# Patient Record
Sex: Female | Born: 1935 | Hispanic: No | Smoking: Never smoker
Health system: Southern US, Community
[De-identification: ages and names within clinical notes are randomized; demographics above are authoritative.]

## PROBLEM LIST (undated history)

## (undated) DIAGNOSIS — K219 Gastro-esophageal reflux disease without esophagitis: Secondary | ICD-10-CM

## (undated) DIAGNOSIS — E041 Nontoxic single thyroid nodule: Secondary | ICD-10-CM

## (undated) DIAGNOSIS — I639 Cerebral infarction, unspecified: Secondary | ICD-10-CM

## (undated) DIAGNOSIS — I10 Essential (primary) hypertension: Secondary | ICD-10-CM

## (undated) DIAGNOSIS — I5032 Chronic diastolic (congestive) heart failure: Secondary | ICD-10-CM

## (undated) DIAGNOSIS — N183 Chronic kidney disease, stage 3 (moderate): Secondary | ICD-10-CM

## (undated) DIAGNOSIS — F0390 Unspecified dementia without behavioral disturbance: Secondary | ICD-10-CM

## (undated) HISTORY — PX: OTHER SURGICAL HISTORY: SHX169

---

## 1997-12-22 ENCOUNTER — Other Ambulatory Visit: Admission: RE | Admit: 1997-12-22 | Discharge: 1997-12-22 | Payer: Self-pay | Admitting: Internal Medicine

## 1998-02-12 ENCOUNTER — Other Ambulatory Visit: Admission: RE | Admit: 1998-02-12 | Discharge: 1998-02-12 | Payer: Self-pay | Admitting: Family Medicine

## 1998-02-28 ENCOUNTER — Ambulatory Visit (HOSPITAL_COMMUNITY): Admission: RE | Admit: 1998-02-28 | Discharge: 1998-02-28 | Payer: Self-pay | Admitting: Family Medicine

## 1998-03-29 ENCOUNTER — Ambulatory Visit (HOSPITAL_COMMUNITY): Admission: RE | Admit: 1998-03-29 | Discharge: 1998-03-29 | Payer: Self-pay | Admitting: Internal Medicine

## 2000-04-20 ENCOUNTER — Emergency Department (HOSPITAL_COMMUNITY): Admission: EM | Admit: 2000-04-20 | Discharge: 2000-04-20 | Payer: Self-pay | Admitting: Emergency Medicine

## 2003-03-29 ENCOUNTER — Encounter: Payer: Self-pay | Admitting: General Practice

## 2003-03-29 ENCOUNTER — Encounter: Admission: RE | Admit: 2003-03-29 | Discharge: 2003-03-29 | Payer: Self-pay | Admitting: General Practice

## 2004-07-16 ENCOUNTER — Ambulatory Visit: Payer: Self-pay | Admitting: Family Medicine

## 2004-08-07 ENCOUNTER — Ambulatory Visit: Payer: Self-pay | Admitting: Family Medicine

## 2004-08-16 ENCOUNTER — Ambulatory Visit: Payer: Self-pay | Admitting: Family Medicine

## 2005-11-02 ENCOUNTER — Inpatient Hospital Stay (HOSPITAL_COMMUNITY): Admission: EM | Admit: 2005-11-02 | Discharge: 2005-11-05 | Payer: Self-pay | Admitting: Emergency Medicine

## 2005-11-21 ENCOUNTER — Encounter: Admission: RE | Admit: 2005-11-21 | Discharge: 2005-11-21 | Payer: Self-pay | Admitting: Internal Medicine

## 2006-04-09 ENCOUNTER — Ambulatory Visit: Payer: Self-pay | Admitting: Obstetrics and Gynecology

## 2006-04-14 ENCOUNTER — Ambulatory Visit (HOSPITAL_COMMUNITY): Admission: RE | Admit: 2006-04-14 | Discharge: 2006-04-14 | Payer: Self-pay | Admitting: Obstetrics and Gynecology

## 2006-04-29 ENCOUNTER — Ambulatory Visit (HOSPITAL_COMMUNITY): Admission: RE | Admit: 2006-04-29 | Discharge: 2006-04-29 | Payer: Self-pay | Admitting: Obstetrics and Gynecology

## 2008-06-26 ENCOUNTER — Inpatient Hospital Stay (HOSPITAL_COMMUNITY): Admission: AD | Admit: 2008-06-26 | Discharge: 2008-06-29 | Payer: Self-pay | Admitting: Internal Medicine

## 2011-01-14 NOTE — H&P (Signed)
NAMESABELLA, TRAORE                 ACCOUNT NO.:  [id number]   MEDICAL RECORD NO.:  [id number]          PATIENT TYPE:  INP   LOCATION:  4715                         FACILITY:  [facility]   PHYSICIAN:  Fleet Contras, M.D.    DATE OF BIRTH:  [birth year]   DATE OF ADMISSION:  06/26/2008  DATE OF DISCHARGE:                              HISTORY & PHYSICAL   PRESENTING COMPLAINTS:  Weakness and anorexia.   HISTORY OF PRESENTING ILLNESS:  Ms. Shor is an 77 year old African  American lady with multiple medical problems including systemic  hypertension, pelvic mass, gout, arthritis of the hip, allergic  rhinitis, and dementia.  She was admitted directly from the office,  although she presented with a 1-week history of progressively worsening  fatigue, tiredness, lack of energy, lack of appetite, and generally not  feeling well.  She had some flu-like symptoms with cold, cough, and sore  throat and this was treated symptomatically with over-the-counter  medication with some relief, but today she is still unable to tolerate a  full diet.  She is able to drink some sips of water and she feels her  mouth is dry.  She feels weak.  She denies any chest pain, shortness of  breath, orthopnea, or PND.  She has had no vomiting, no diarrhea.  No  headaches, dizziness, syncope, seizures, or weakness of extremities.  No  dysuria, frequency, or hematuria.  She has had no blood in her stools.  She denies any joint pains or joint swelling.  On examination in the  office, she was noted to be chronically ill-looking.  Her blood pressure  was 102/64, heart rate of 72 and regular, respiratory rate of 18, and  temperature was 98.3.  Her CBG was over 600 and she had glycosuria  without ketonuria.  She was therefore admitted to the hospital because  of her age and new-onset diabetes mellitus.   PAST MEDICAL HISTORY:  1. Systemic hypertension.  2. Dementia.  3. Allergic rhinitis.  4. Arthritis of the hips.  5. Gouty  arthritis.  6. Pelvic mass.  7. Secondary syphilis.  8. Renal insufficiency.   SOCIAL HISTORY:  She currently lives with her daughter and  grandchildren.  She is widowed.  She denies any use of alcohol or  illicit drugs or tobacco.  She does not know to speak much [place].   ALLERGIES:  She has no known drug allergies.   CURRENT MEDICATIONS:  1. Prilosec 20 mg a day.  2. Benicar HCT 40/12.5 once a day.  3. Allopurinol 100 mg daily.  4. Darvocet-N 100 one p.o. q.12 h. p.r.n.  5. Allegra 30 mg daily.  6. Aspirin 325 mg daily.  7. Toprol-XL 100 mg p.o. daily.   REVIEW OF SYSTEMS:  Essentially as above.   PHYSICAL EXAMINATION:  GENERAL:  She is chronically ill-looking, lying  on the examination bed, not in acute respiratory or painful distress,  but looks lethargic.  HEENT:  She is not pale.  She is not icteric.  She is not cyanosed.  She  is dehydrated with dry oral and tongue  mucosa.  Pupils are equal and  reactive to light and accommodation.  VITAL SIGNS:  Her blood pressure is 102/64, heart rate of 72,  respiratory rate of 18, temperature of 98.3, weight 133 pounds, and  height 5 feet 6 inches.  NECK:  Supple.  No elevated JVD.  No cervical lymphadenopathy.  No  carotid bruit.  CHEST:  Good air entry bilaterally with no rales or rhonchi or wheezes.  HEART:  Sounds 1 and 2 are heard with no murmurs.  ABDOMEN:  Full, soft, nontender.  No masses.  Bowel sounds are present.  EXTREMITIES:  No edema, calf tenderness, or swelling.  CNS:  She is alert and oriented x3 with no focal neurological deficits.   LABORATORY DATA:  These are currently still pending.   ASSESSMENT:  Ms. Reid is an 77 year old African lady with multiple  medical problems as above presenting with fatigue and anorexia for about  a week.  She has not been found to have developed new-onset of type 2  diabetes mellitus with marked hyperglycemia and she has been admitted to  the hospital for further treatment.    ADMISSION DIAGNOSES:  1. Severe hyperglycemia.  2. Type 2 diabetes mellitus, new onset.  3. Dehydration.   PLAN OF CARE:  She will be placed on the medical bed.  She will be on  CBGs a.c. and at bedtime, 2 g sodium diet, strict I's and O's.  Vital  signs q.4 h.  Lantus insulin 10 units nightly.  NovoLog insulin per  sliding scale moderate protocol.  Blood pressure medication will be put  on hold until stable.  She will be on IV fluids with normal saline at  500 mL bolus followed by normal saline with 20 of potassium at 100 mL an  hour.  This plan of care has been discussed with her and her grandson  who is with her today and their questions were answered.      Fleet Contras, M.D.  Electronically Signed     EA/MEDQ  D:  06/26/2008  T:  06/27/2008  Job:  [id number]

## 2011-01-14 NOTE — Discharge Summary (Signed)
Cynthia Todd, Cynthia Todd                 ACCOUNT NO.:  [id number]   MEDICAL RECORD NO.:  [id number]          PATIENT TYPE:  INP   LOCATION:  4715                         FACILITY:  [facility]   PHYSICIAN:  Fleet Contras, M.D.    DATE OF BIRTH:  [birth year]   DATE OF ADMISSION:  06/26/2008  DATE OF DISCHARGE:  06/29/2008                               DISCHARGE SUMMARY   HISTORY OF PRESENT ILLNESS:  Ms. Auth is an 77 year old African lady  with past medical history significant for systemic hypertension, gouty  arthritis, allergic rhinitis, and dementia.  She was admitted directly  from my office, but she presented with 1-week history of progressively  worsening weakness, tiredness, lack of energy, lack of appetite,  generally feeling unwell.  She had been treated for recent cold and  cough and sore throat, but her symptoms did not abate.  She was unable  to tolerate full diet, but had been drinking sips of water and the mouth  feels dry.  Her vital signs were stable in the office.  She looks  dehydrated.  Initial CBG in the office was over 600 and she had glucose  in her urine without ketones.  She was therefore admitted to the  hospital as a case of new-onset type 2 diabetes mellitus and  dehydration.   HOSPITAL COURSE:  On admission, the patient was started on IV fluids  with normal saline bolus 100 mL followed by 125 mL an hour.  She was on  Glucommander protocol until her sugars were controlled and then  transitioned to Lantus insulin as well as NovoLog sliding scale insulin.  She also started on oral Amaryl 2 mg once a day.  This was later  increased to 4 mg once a day.  Her symptoms improved and she was able to  tolerate full diet.  Her initial laboratory data did show elevated BUN  and creatinine of 63 and 2.68 respectively.  This improved with  hydration.  Today, her BUN is 23, creatinine is 1.15, glucose is 159.  She was seen by the diabetic coordinator for the hospital and educated  extensively along with her family members on diabetes treatment plan,  dietary nutrition changes necessary, and given information in [place]  language.  Today, she is feeling much better.  Her vital signs are  stable.  She is well hydrated.  She is considered stable for discharge  home.   CONDITION ON DISCHARGE:  Stable.   Her disposition is for home.   DISCHARGE DIAGNOSES:  1. New-onset type 2 diabetes mellitus.  2. Severe hyperglycemia, now resolved.  3. Hyperosmolar state, now resolved.  4. Acute renal failure, now resolved.   Her medication on discharge will be Amaryl 4 mg once a day, aspirin 325  mg p.o. daily, Lantus insulin 10 units at bedtime, allopurinol 100 mg  daily, Claritin 10 mg daily, Darvocet-N 100 one p.o. q.12 p.r.n.  Her  blood pressure medication, Benicar HCT 40/12.5 which had been put on  hold will be restarted at home.   This discharge plan has been discussed with her and her  family and their  questions are answered.      Fleet Contras, M.D.  Electronically Signed     EA/MEDQ  D:  06/29/2008  T:  06/29/2008  Job:  [id number]

## 2011-01-17 NOTE — Group Therapy Note (Signed)
NAMEBURNETT, LIEBER NO.:  [id number]   MEDICAL RECORD NO.:  [id number]          PATIENT TYPE:  WOC   LOCATION:  [facility]                   FACILITY:  [facility]   PHYSICIAN:  Argentina Donovan, MD        DATE OF BIRTH:  Jul 01, 1919   DATE OF SERVICE:                                    CLINIC NOTE   The patient is an 77 year old [place] female, gravida 4, para 2, 0, 2, 2 who  we had difficulty eliciting a complete history from because of the language  barrier and the fact that we had only her 64 year old great-granddaughter  for translation. The patient went in to the emergency room at [facility],  some time in March because of vaginal bleeding and abdominal pain. At that  time they did a transvaginal ultrasound and found a cystic complex mass in  the right midline of the pelvis, probably of ovarian versus likely chronic  hydrosalpinx or hemorrhage and debris. She also had a thickened endometrium  at that time and she was referred over here to the GYN clinic.  She was  admitted at that time and there was difficulty knowing exactly what the  reason was, but her discharge diagnosis was pelvic mass, gout, renal  insufficiency, resolved. Nausea and diarrhea, leukocytosis, hypertension and  secondary syphilis.   The only medication she is on now is Toprol XL 100 daily for her blood  pressure.   The patient is a very pleasant lady and did agree to an endometrial biopsy  which we tried to do but the cervix is so tightly stenosed that it was not  possible, although we could get in and see the cervix with the speculum in  spite of the infibulation. While she was in the hospital she was treated for  secondary syphilis.  Since I cannot do an endometrial biopsy here in the  office, my plan is to get a pelvic CT to see whether that clarifies anything  and then to discuss with GYN oncologist whether or not we should go ahead  and do a D&C, or try to do a D&C prior to sending her over  there for  consultation.   DISCHARGE DIAGNOSIS:  77 year old female with pelvic mass and thickened  endometrial cavity.   A CA-125 is also being done today.           ______________________________  Argentina Donovan, MD     PR/MEDQ  D:  04/09/2006  T:  04/09/2006  Job:  [id number]

## 2011-06-03 LAB — BASIC METABOLIC PANEL
BUN: 47 — ABNORMAL HIGH
CO2: 23
Calcium: 8 — ABNORMAL LOW
Calcium: 8.1 — ABNORMAL LOW
Calcium: 8.4
Chloride: 106
Creatinine, Ser: 1.15
Creatinine, Ser: 1.64 — ABNORMAL HIGH
GFR calc Af Amer: 24 — ABNORMAL LOW
GFR calc non Af Amer: 20 — ABNORMAL LOW
GFR calc non Af Amer: 30 — ABNORMAL LOW
Glucose, Bld: 159 — ABNORMAL HIGH
Glucose, Bld: 185 — ABNORMAL HIGH
Glucose, Bld: 189 — ABNORMAL HIGH
Sodium: 135
Sodium: 138

## 2011-06-03 LAB — GLUCOSE, CAPILLARY
Glucose-Capillary: 117 — ABNORMAL HIGH
Glucose-Capillary: 143 — ABNORMAL HIGH
Glucose-Capillary: 145 — ABNORMAL HIGH
Glucose-Capillary: 170 — ABNORMAL HIGH
Glucose-Capillary: 182 — ABNORMAL HIGH
Glucose-Capillary: 240 — ABNORMAL HIGH
Glucose-Capillary: >600
Glucose-Capillary: >600
Glucose-Capillary: >600

## 2011-06-03 LAB — COMPREHENSIVE METABOLIC PANEL
ALT: 25
Albumin: 3.4 — ABNORMAL LOW
Alkaline Phosphatase: 99
Calcium: 9.1
Chloride: 92 — ABNORMAL LOW
GFR calc non Af Amer: 17 — ABNORMAL LOW
Glucose, Bld: 798
Potassium: 3.9
Total Bilirubin: 0.6

## 2011-06-03 LAB — CBC
HCT: 40.7
Hemoglobin: 12.2
Hemoglobin: 13.6
Platelets: 120 — ABNORMAL LOW
Platelets: 174
RBC: 4.3
RDW: 12.6
RDW: 13.1

## 2011-06-03 LAB — URIC ACID: Uric Acid, Serum: 9 — ABNORMAL HIGH

## 2011-06-03 LAB — HEMOGLOBIN A1C

## 2011-09-02 DIAGNOSIS — I5032 Chronic diastolic (congestive) heart failure: Secondary | ICD-10-CM

## 2011-09-02 HISTORY — DX: Chronic diastolic (congestive) heart failure: I50.32

## 2011-09-27 ENCOUNTER — Encounter (HOSPITAL_COMMUNITY): Payer: Self-pay | Admitting: *Deleted

## 2011-09-27 ENCOUNTER — Other Ambulatory Visit: Payer: Self-pay

## 2011-09-27 ENCOUNTER — Inpatient Hospital Stay (HOSPITAL_COMMUNITY)
Admission: EM | Admit: 2011-09-27 | Discharge: 2011-10-02 | DRG: 152 | Disposition: A | Discharge status: Home / Self Care | Payer: Medicare Other | Attending: Internal Medicine | Admitting: Internal Medicine

## 2011-09-27 ENCOUNTER — Emergency Department (HOSPITAL_COMMUNITY): Payer: Medicare Other

## 2011-09-27 DIAGNOSIS — I5033 Acute on chronic diastolic (congestive) heart failure: Secondary | ICD-10-CM | POA: Diagnosis present

## 2011-09-27 DIAGNOSIS — N289 Disorder of kidney and ureter, unspecified: Secondary | ICD-10-CM | POA: Diagnosis present

## 2011-09-27 DIAGNOSIS — E119 Type 2 diabetes mellitus without complications: Secondary | ICD-10-CM | POA: Diagnosis present

## 2011-09-27 DIAGNOSIS — Z794 Long term (current) use of insulin: Secondary | ICD-10-CM

## 2011-09-27 DIAGNOSIS — R062 Wheezing: Secondary | ICD-10-CM

## 2011-09-27 DIAGNOSIS — I10 Essential (primary) hypertension: Secondary | ICD-10-CM

## 2011-09-27 DIAGNOSIS — R0789 Other chest pain: Secondary | ICD-10-CM | POA: Diagnosis present

## 2011-09-27 DIAGNOSIS — I509 Heart failure, unspecified: Secondary | ICD-10-CM | POA: Diagnosis present

## 2011-09-27 DIAGNOSIS — IMO0002 Reserved for concepts with insufficient information to code with codable children: Secondary | ICD-10-CM | POA: Diagnosis present

## 2011-09-27 DIAGNOSIS — R0602 Shortness of breath: Secondary | ICD-10-CM

## 2011-09-27 DIAGNOSIS — F0390 Unspecified dementia without behavioral disturbance: Secondary | ICD-10-CM | POA: Diagnosis present

## 2011-09-27 DIAGNOSIS — I129 Hypertensive chronic kidney disease with stage 1 through stage 4 chronic kidney disease, or unspecified chronic kidney disease: Secondary | ICD-10-CM | POA: Diagnosis present

## 2011-09-27 DIAGNOSIS — J111 Influenza due to unidentified influenza virus with other respiratory manifestations: Principal | ICD-10-CM | POA: Diagnosis present

## 2011-09-27 DIAGNOSIS — I5030 Unspecified diastolic (congestive) heart failure: Secondary | ICD-10-CM

## 2011-09-27 DIAGNOSIS — I272 Pulmonary hypertension, unspecified: Secondary | ICD-10-CM | POA: Diagnosis present

## 2011-09-27 DIAGNOSIS — D72829 Elevated white blood cell count, unspecified: Secondary | ICD-10-CM | POA: Diagnosis present

## 2011-09-27 DIAGNOSIS — E876 Hypokalemia: Secondary | ICD-10-CM | POA: Diagnosis present

## 2011-09-27 DIAGNOSIS — I2789 Other specified pulmonary heart diseases: Secondary | ICD-10-CM | POA: Diagnosis present

## 2011-09-27 DIAGNOSIS — E118 Type 2 diabetes mellitus with unspecified complications: Secondary | ICD-10-CM | POA: Diagnosis present

## 2011-09-27 DIAGNOSIS — R071 Chest pain on breathing: Secondary | ICD-10-CM | POA: Diagnosis present

## 2011-09-27 DIAGNOSIS — N189 Chronic kidney disease, unspecified: Secondary | ICD-10-CM | POA: Diagnosis present

## 2011-09-27 HISTORY — DX: Essential (primary) hypertension: I10

## 2011-09-27 LAB — BASIC METABOLIC PANEL
BUN: 24 mg/dL — ABNORMAL HIGH (ref 6–23)
CO2: 27 mEq/L (ref 19–32)
Calcium: 9.3 mg/dL (ref 8.4–10.5)
Chloride: 98 mEq/L (ref 96–112)
Creatinine, Ser: 1.16 mg/dL — ABNORMAL HIGH (ref 0.50–1.10)
GFR calc Af Amer: 46 mL/min — ABNORMAL LOW (ref >90)
GFR calc non Af Amer: 40 mL/min — ABNORMAL LOW (ref >90)
Glucose, Bld: 109 mg/dL — ABNORMAL HIGH (ref 70–99)
Potassium: 3 mEq/L — ABNORMAL LOW (ref 3.5–5.1)
Sodium: 135 mEq/L (ref 135–145)

## 2011-09-27 LAB — CBC
HCT: 38.3 % (ref 36.0–46.0)
Hemoglobin: 13.5 g/dL (ref 12.0–15.0)
MCH: 32.5 pg (ref 26.0–34.0)
MCHC: 35.2 g/dL (ref 30.0–36.0)
MCV: 92.1 fL (ref 78.0–100.0)
Platelets: 180 10*3/uL (ref 150–400)
RBC: 4.16 MIL/uL (ref 3.87–5.11)
RDW: 13.1 % (ref 11.5–15.5)
WBC: 5.1 10*3/uL (ref 4.0–10.5)

## 2011-09-27 LAB — CARDIAC PANEL(CRET KIN+CKTOT+MB+TROPI)
CK, MB: 4.5 ng/mL — ABNORMAL HIGH (ref 0.3–4.0)
Relative Index: 1.8 (ref 0.0–2.5)
Total CK: 250 U/L — ABNORMAL HIGH (ref 7–177)
Troponin I: <0.3 ng/mL (ref <0.30)

## 2011-09-27 MED ORDER — ALBUTEROL SULFATE (5 MG/ML) 0.5% IN NEBU
INHALATION_SOLUTION | RESPIRATORY_TRACT | Status: AC
  Administered 2011-09-27: 5 mg
  Filled 2011-09-27: qty 1

## 2011-09-27 MED ORDER — ALBUTEROL (5 MG/ML) CONTINUOUS INHALATION SOLN
10.0000 mg/h | INHALATION_SOLUTION | RESPIRATORY_TRACT | Status: AC
  Administered 2011-09-27: 10 mg/h via RESPIRATORY_TRACT
  Filled 2011-09-27: qty 20

## 2011-09-27 MED ORDER — IPRATROPIUM BROMIDE 0.02 % IN SOLN
RESPIRATORY_TRACT | Status: AC
  Administered 2011-09-27: 0.5 mg via RESPIRATORY_TRACT
  Filled 2011-09-27: qty 2.5

## 2011-09-27 NOTE — ED Notes (Signed)
Patient with shortness of breath since Wednesday and was suppose to see MD on Friday but the office was close.  Patient is wheezing at triage.

## 2011-09-27 NOTE — ED Provider Notes (Signed)
History is obtained via a medical interpreter using language line. Planes of cough and shortness of breath and subjective fever onset 5 days ago. Symptoms accompanied by pleuritic chest pain. Were preceded by vomiting and diarrhea. On exam no acute distress alert neck supple no JVD lungs with prolonged expiration phase with expiratory wheezes with occasional cough heart regular rate and rhythm abdomen nondistended nontender extremities no ED  Doug Sou, MD 09/27/11 2255

## 2011-09-27 NOTE — ED Provider Notes (Signed)
History     CSN: [id number]  Arrival date & time 09/27/11  1909   First MD Initiated Contact with Patient 09/27/11 2125      Chief Complaint  Patient presents with  . Shortness of Breath    (Consider location/radiation/quality/duration/timing/severity/associated sxs/prior treatment) HPI  Past Medical History  Diagnosis Date  . Diabetes mellitus   . Hypertension     History reviewed. No pertinent past surgical history.  History reviewed. No pertinent family history.  History  Substance Use Topics  . Smoking status: Never Smoker   . Smokeless tobacco: Not on file  . Alcohol Use: No    OB History    Grav Para Term Preterm Abortions TAB SAB Ect Mult Living                  Review of Systems  Allergies  Review of patient's allergies indicates no known allergies.  Home Medications   Current Outpatient Rx  Name Route Sig Dispense Refill  . ACETAMINOPHEN 500 MG PO TABS Oral Take 500 mg by mouth at bedtime as needed. For pain/headache    . GLIMEPIRIDE 4 MG PO TABS Oral Take 4 mg by mouth daily before breakfast.    . INSULIN GLARGINE 100 UNIT/ML [place] SOLN Subcutaneous Inject 100 Units into the skin at bedtime.    Marland Kitchen LEVOTHYROXINE SODIUM 50 MCG PO TABS Oral Take 50 mcg by mouth daily.    Marland Kitchen NAPHAZOLINE HCL 0.012 % OP SOLN Both Eyes Place 2 drops into both eyes 4 (four) times daily as needed. For irritation    . NEBIVOLOL HCL 5 MG PO TABS Oral Take 5 mg by mouth daily.      BP 232/89  Pulse 74  Temp(Src) 98.1 F (36.7 C) (Oral)  Resp 22  SpO2 95%  Physical Exam  ED Course  Procedures (including critical care time)  Labs Reviewed - No data to display Dg Chest 2 View  09/27/2011  *RADIOLOGY REPORT*  Clinical Data: 78 year old female with shortness of breath.  CHEST - 2 VIEW  Comparison: CT abdomen 04/14/2006.  Findings: Small bilateral pleural effusions.  Stable mild elevation of the right hemidiaphragm.  Pulmonary vascular congestion.  No consolidation.  No  pneumothorax.  Trace fluid in the right minor fissure.  Calcified atherosclerosis of the aorta. No acute osseous abnormality identified.  IMPRESSION: Small pleural effusions.  Vascular congestion without overt pulmonary edema.  Original Report Authenticated By: Harley Hallmark, M.D.     No diagnosis found.    MDM  DUPLICATE NOTE        Doug Sou, MD 09/29/11 4098

## 2011-09-27 NOTE — ED Notes (Signed)
Pt reports wheezing and sob since Wednesday. Noted RR 32 with adventitious breath sounds post breathin tx. No cough noted. Pt also noted hypertensive. Dtr at bedside.

## 2011-09-27 NOTE — ED Notes (Signed)
Arranging for patient to be taken to POD.

## 2011-09-28 ENCOUNTER — Inpatient Hospital Stay (HOSPITAL_COMMUNITY): Payer: Medicare Other

## 2011-09-28 ENCOUNTER — Encounter (HOSPITAL_COMMUNITY): Payer: Self-pay | Admitting: Internal Medicine

## 2011-09-28 DIAGNOSIS — E119 Type 2 diabetes mellitus without complications: Secondary | ICD-10-CM | POA: Diagnosis present

## 2011-09-28 DIAGNOSIS — I10 Essential (primary) hypertension: Secondary | ICD-10-CM | POA: Diagnosis present

## 2011-09-28 DIAGNOSIS — E876 Hypokalemia: Secondary | ICD-10-CM | POA: Diagnosis present

## 2011-09-28 DIAGNOSIS — R0602 Shortness of breath: Secondary | ICD-10-CM | POA: Diagnosis present

## 2011-09-28 DIAGNOSIS — E041 Nontoxic single thyroid nodule: Secondary | ICD-10-CM

## 2011-09-28 DIAGNOSIS — R071 Chest pain on breathing: Secondary | ICD-10-CM | POA: Diagnosis present

## 2011-09-28 HISTORY — DX: Nontoxic single thyroid nodule: E04.1

## 2011-09-28 LAB — COMPREHENSIVE METABOLIC PANEL
AST: 53 U/L — ABNORMAL HIGH (ref 0–37)
Albumin: 2.7 g/dL — ABNORMAL LOW (ref 3.5–5.2)
BUN: 22 mg/dL (ref 6–23)
Calcium: 8.4 mg/dL (ref 8.4–10.5)
Chloride: 98 mEq/L (ref 96–112)
Creatinine, Ser: 0.99 mg/dL (ref 0.50–1.10)
Total Bilirubin: 0.3 mg/dL (ref 0.3–1.2)

## 2011-09-28 LAB — URINALYSIS, ROUTINE W REFLEX MICROSCOPIC
Bilirubin Urine: NEGATIVE
Glucose, UA: NEGATIVE mg/dL
Ketones, ur: NEGATIVE mg/dL
Leukocytes, UA: NEGATIVE
Nitrite: NEGATIVE
Protein, ur: >300 mg/dL — AB
Specific Gravity, Urine: 1.013 (ref 1.005–1.030)
Urobilinogen, UA: 0.2 mg/dL (ref 0.0–1.0)
pH: 5.5 (ref 5.0–8.0)

## 2011-09-28 LAB — GLUCOSE, CAPILLARY: Glucose-Capillary: 259 mg/dL — ABNORMAL HIGH (ref 70–99)

## 2011-09-28 LAB — CARDIAC PANEL(CRET KIN+CKTOT+MB+TROPI)
Relative Index: 2.4 (ref 0.0–2.5)
Total CK: 193 U/L — ABNORMAL HIGH (ref 7–177)
Total CK: 187 U/L — ABNORMAL HIGH (ref 7–177)
Troponin I: <0.3 ng/mL (ref <0.30)
Troponin I: <0.3 ng/mL (ref <0.30)

## 2011-09-28 LAB — MAGNESIUM: Magnesium: 1.6 mg/dL (ref 1.5–2.5)

## 2011-09-28 LAB — MRSA PCR SCREENING: MRSA by PCR: NEGATIVE

## 2011-09-28 LAB — PHOSPHORUS: Phosphorus: 4.6 mg/dL (ref 2.3–4.6)

## 2011-09-28 LAB — URINE MICROSCOPIC-ADD ON

## 2011-09-28 LAB — TSH: TSH: 1.399 u[IU]/mL (ref 0.350–4.500)

## 2011-09-28 LAB — CBC
Hemoglobin: 12.8 g/dL (ref 12.0–15.0)
MCH: 31.8 pg (ref 26.0–34.0)
MCV: 92.5 fL (ref 78.0–100.0)
Platelets: 188 10*3/uL (ref 150–400)
RBC: 4.02 MIL/uL (ref 3.87–5.11)
WBC: 6.1 10*3/uL (ref 4.0–10.5)

## 2011-09-28 LAB — PRO B NATRIURETIC PEPTIDE: Pro B Natriuretic peptide (BNP): 1438 pg/mL — ABNORMAL HIGH (ref 0–450)

## 2011-09-28 MED ORDER — INSULIN ASPART 100 UNIT/ML ~~LOC~~ SOLN
0.0000 [IU] | Freq: Three times a day (TID) | SUBCUTANEOUS | Status: DC
  Administered 2011-09-28: 20 [IU] via SUBCUTANEOUS
  Administered 2011-09-28: 11 [IU] via SUBCUTANEOUS
  Administered 2011-09-29: 7 [IU] via SUBCUTANEOUS
  Administered 2011-09-29: 20 [IU] via SUBCUTANEOUS
  Administered 2011-09-30: 3 [IU] via SUBCUTANEOUS
  Administered 2011-09-30 – 2011-10-01 (×3): 4 [IU] via SUBCUTANEOUS
  Administered 2011-10-01: 7 [IU] via SUBCUTANEOUS
  Administered 2011-10-01: 3 [IU] via SUBCUTANEOUS
  Administered 2011-10-02: 11 [IU] via SUBCUTANEOUS

## 2011-09-28 MED ORDER — ACETAMINOPHEN 500 MG PO TABS: 500.0000 mg | ORAL_TABLET | Freq: Every evening | ORAL | Status: DC | PRN

## 2011-09-28 MED ORDER — HYDROCODONE-ACETAMINOPHEN 5-325 MG PO TABS: 1.0000 | ORAL_TABLET | ORAL | Status: DC | PRN

## 2011-09-28 MED ORDER — HYDRALAZINE HCL 20 MG/ML IJ SOLN
10.0000 mg | INTRAMUSCULAR | Status: DC | PRN
  Administered 2011-09-28: 10 mg via INTRAVENOUS
  Filled 2011-09-28 (×2): qty 0.5

## 2011-09-28 MED ORDER — GLIMEPIRIDE 4 MG PO TABS
4.0000 mg | ORAL_TABLET | Freq: Every day | ORAL | Status: DC
  Administered 2011-09-28 – 2011-10-02 (×5): 4 mg via ORAL
  Filled 2011-09-28 (×6): qty 1

## 2011-09-28 MED ORDER — OXYCODONE HCL 5 MG PO TABS: 5.0000 mg | ORAL_TABLET | ORAL | Status: DC | PRN

## 2011-09-28 MED ORDER — INSULIN ASPART 100 UNIT/ML ~~LOC~~ SOLN
6.0000 [IU] | Freq: Three times a day (TID) | SUBCUTANEOUS | Status: DC
  Administered 2011-09-28 (×2): 6 [IU] via SUBCUTANEOUS

## 2011-09-28 MED ORDER — ALUM & MAG HYDROXIDE-SIMETH 200-200-20 MG/5ML PO SUSP: 30.0000 mL | Freq: Four times a day (QID) | ORAL | Status: DC | PRN

## 2011-09-28 MED ORDER — IPRATROPIUM BROMIDE 0.02 % IN SOLN
RESPIRATORY_TRACT | Status: AC
  Administered 2011-09-28: 0.5 mg via RESPIRATORY_TRACT
  Filled 2011-09-28: qty 2.5

## 2011-09-28 MED ORDER — HYDRALAZINE HCL 20 MG/ML IJ SOLN
10.0000 mg | Freq: Four times a day (QID) | INTRAMUSCULAR | Status: DC
  Administered 2011-09-28 – 2011-09-29 (×3): 10 mg via INTRAVENOUS
  Filled 2011-09-28 (×8): qty 0.5

## 2011-09-28 MED ORDER — ONDANSETRON HCL 4 MG/2ML IJ SOLN: 4.0000 mg | Freq: Four times a day (QID) | INTRAMUSCULAR | Status: DC | PRN

## 2011-09-28 MED ORDER — BUDESONIDE 0.5 MG/2ML IN SUSP
0.5000 mg | Freq: Once | RESPIRATORY_TRACT | Status: AC
  Administered 2011-09-28: 0.5 mg via RESPIRATORY_TRACT
  Filled 2011-09-28: qty 2

## 2011-09-28 MED ORDER — MOXIFLOXACIN HCL 400 MG PO TABS
400.0000 mg | ORAL_TABLET | Freq: Once | ORAL | Status: AC
  Administered 2011-09-28: 400 mg via ORAL
  Filled 2011-09-28: qty 1

## 2011-09-28 MED ORDER — FUROSEMIDE 10 MG/ML IJ SOLN
60.0000 mg | Freq: Once | INTRAMUSCULAR | Status: AC
  Administered 2011-09-28: 60 mg via INTRAVENOUS
  Filled 2011-09-28: qty 6

## 2011-09-28 MED ORDER — GUAIFENESIN ER 600 MG PO TB12
600.0000 mg | ORAL_TABLET | Freq: Two times a day (BID) | ORAL | Status: DC
  Administered 2011-09-28 (×2): 600 mg via ORAL
  Filled 2011-09-28 (×4): qty 1

## 2011-09-28 MED ORDER — IOHEXOL 350 MG/ML SOLN
60.0000 mL | Freq: Once | INTRAVENOUS | Status: AC | PRN
  Administered 2011-09-28: 60 mL via INTRAVENOUS

## 2011-09-28 MED ORDER — LEVALBUTEROL HCL 0.63 MG/3ML IN NEBU
0.6300 mg | INHALATION_SOLUTION | RESPIRATORY_TRACT | Status: DC | PRN
  Filled 2011-09-28: qty 3

## 2011-09-28 MED ORDER — POTASSIUM CHLORIDE CRYS ER 20 MEQ PO TBCR
40.0000 meq | EXTENDED_RELEASE_TABLET | Freq: Once | ORAL | Status: AC
  Administered 2011-09-28: 40 meq via ORAL
  Filled 2011-09-28: qty 2

## 2011-09-28 MED ORDER — LEVOTHYROXINE SODIUM 50 MCG PO TABS
50.0000 ug | ORAL_TABLET | Freq: Every day | ORAL | Status: DC
  Administered 2011-09-28 – 2011-10-02 (×5): 50 ug via ORAL
  Filled 2011-09-28 (×7): qty 1

## 2011-09-28 MED ORDER — ALBUTEROL SULFATE (5 MG/ML) 0.5% IN NEBU: 2.5000 mg | INHALATION_SOLUTION | RESPIRATORY_TRACT | Status: DC | PRN

## 2011-09-28 MED ORDER — ALBUTEROL SULFATE (5 MG/ML) 0.5% IN NEBU
INHALATION_SOLUTION | RESPIRATORY_TRACT | Status: AC
  Administered 2011-09-28: 2.5 mg
  Filled 2011-09-28: qty 0.5

## 2011-09-28 MED ORDER — IPRATROPIUM BROMIDE 0.02 % IN SOLN
0.5000 mg | Freq: Four times a day (QID) | RESPIRATORY_TRACT | Status: DC
  Administered 2011-09-27 – 2011-09-30 (×10): 0.5 mg via RESPIRATORY_TRACT
  Filled 2011-09-28 (×10): qty 2.5

## 2011-09-28 MED ORDER — METHYLPREDNISOLONE SODIUM SUCC 125 MG IJ SOLR
125.0000 mg | Freq: Once | INTRAMUSCULAR | Status: AC
  Administered 2011-09-28: 125 mg via INTRAVENOUS
  Filled 2011-09-28: qty 2

## 2011-09-28 MED ORDER — INSULIN ASPART 100 UNIT/ML ~~LOC~~ SOLN
0.0000 [IU] | Freq: Three times a day (TID) | SUBCUTANEOUS | Status: DC
  Administered 2011-09-28: 15 [IU] via SUBCUTANEOUS
  Filled 2011-09-28: qty 3

## 2011-09-28 MED ORDER — WHITE PETROLATUM GEL
Status: AC
  Administered 2011-09-28: 19:00:00
  Filled 2011-09-28: qty 5

## 2011-09-28 MED ORDER — ENOXAPARIN SODIUM 40 MG/0.4ML ~~LOC~~ SOLN
40.0000 mg | SUBCUTANEOUS | Status: DC
  Administered 2011-09-28 – 2011-10-01 (×4): 40 mg via SUBCUTANEOUS
  Filled 2011-09-28 (×4): qty 0.4

## 2011-09-28 MED ORDER — NAPHAZOLINE HCL 0.012 % OP SOLN: 2.0000 [drp] | Freq: Four times a day (QID) | OPHTHALMIC | Status: DC | PRN

## 2011-09-28 MED ORDER — MOXIFLOXACIN HCL 400 MG PO TABS
400.0000 mg | ORAL_TABLET | Freq: Every day | ORAL | Status: DC
  Filled 2011-09-28: qty 1

## 2011-09-28 MED ORDER — INSULIN ASPART 100 UNIT/ML ~~LOC~~ SOLN: 0.0000 [IU] | Freq: Every day | SUBCUTANEOUS | Status: DC

## 2011-09-28 MED ORDER — SODIUM CHLORIDE 0.45 % IV SOLN
INTRAVENOUS | Status: DC
  Administered 2011-09-28: 10 mL via INTRAVENOUS

## 2011-09-28 MED ORDER — GUAIFENESIN-DM 100-10 MG/5ML PO SYRP
5.0000 mL | ORAL_SOLUTION | ORAL | Status: DC | PRN
  Administered 2011-09-30: 5 mL via ORAL
  Filled 2011-09-28: qty 5

## 2011-09-28 MED ORDER — ONDANSETRON HCL 4 MG PO TABS: 4.0000 mg | ORAL_TABLET | Freq: Four times a day (QID) | ORAL | Status: DC | PRN

## 2011-09-28 MED ORDER — METHYLPREDNISOLONE SODIUM SUCC 125 MG IJ SOLR
60.0000 mg | Freq: Two times a day (BID) | INTRAMUSCULAR | Status: DC
  Administered 2011-09-28 – 2011-09-29 (×2): 60 mg via INTRAVENOUS
  Filled 2011-09-28 (×4): qty 0.96

## 2011-09-28 MED ORDER — BIOTENE DRY MOUTH MT LIQD
15.0000 mL | Freq: Two times a day (BID) | OROMUCOSAL | Status: DC
  Administered 2011-09-29 – 2011-10-01 (×4): 15 mL via OROMUCOSAL

## 2011-09-28 MED ORDER — NEBIVOLOL HCL 5 MG PO TABS
5.0000 mg | ORAL_TABLET | Freq: Every day | ORAL | Status: DC
  Administered 2011-09-28: 5 mg via ORAL
  Filled 2011-09-28: qty 1

## 2011-09-28 MED ORDER — PNEUMOCOCCAL VAC POLYVALENT 25 MCG/0.5ML IJ INJ
0.5000 mL | INJECTION | INTRAMUSCULAR | Status: AC
  Administered 2011-09-29: 0.5 mL via INTRAMUSCULAR
  Filled 2011-09-28: qty 0.5

## 2011-09-28 MED ORDER — LEVALBUTEROL HCL 0.63 MG/3ML IN NEBU
0.6300 mg | INHALATION_SOLUTION | Freq: Four times a day (QID) | RESPIRATORY_TRACT | Status: DC
  Administered 2011-09-28 – 2011-09-30 (×9): 0.63 mg via RESPIRATORY_TRACT
  Filled 2011-09-28 (×12): qty 3

## 2011-09-28 MED ORDER — ACETAMINOPHEN 650 MG RE SUPP: 650.0000 mg | Freq: Four times a day (QID) | RECTAL | Status: DC | PRN

## 2011-09-28 MED ORDER — INSULIN GLARGINE 100 UNIT/ML ~~LOC~~ SOLN
50.0000 [IU] | Freq: Every day | SUBCUTANEOUS | Status: DC
  Administered 2011-09-28: 50 [IU] via SUBCUTANEOUS
  Filled 2011-09-28: qty 3

## 2011-09-28 MED ORDER — INSULIN ASPART 100 UNIT/ML ~~LOC~~ SOLN
0.0000 [IU] | Freq: Every day | SUBCUTANEOUS | Status: DC
  Administered 2011-09-28 – 2011-10-01 (×2): 3 [IU] via SUBCUTANEOUS

## 2011-09-28 MED ORDER — SODIUM CHLORIDE 0.9 % IV SOLN
INTRAVENOUS | Status: DC
  Administered 2011-09-28: 04:00:00 via INTRAVENOUS

## 2011-09-28 MED ORDER — MORPHINE SULFATE 2 MG/ML IJ SOLN: 1.0000 mg | INTRAMUSCULAR | Status: DC | PRN

## 2011-09-28 MED ORDER — ACETAMINOPHEN 325 MG PO TABS: 650.0000 mg | ORAL_TABLET | Freq: Four times a day (QID) | ORAL | Status: DC | PRN

## 2011-09-28 MED ORDER — NITROGLYCERIN IN D5W 200-5 MCG/ML-% IV SOLN
2.0000 ug/min | INTRAVENOUS | Status: DC
  Administered 2011-09-28: 5 ug/min via INTRAVENOUS
  Administered 2011-09-28: 30 ug/min via INTRAVENOUS
  Filled 2011-09-28: qty 250

## 2011-09-28 MED ORDER — HYDRALAZINE HCL 20 MG/ML IJ SOLN
10.0000 mg | INTRAMUSCULAR | Status: AC
  Administered 2011-09-28: 10 mg via INTRAVENOUS
  Filled 2011-09-28: qty 0.5

## 2011-09-28 MED ORDER — NAPHAZOLINE-PHENIRAMINE 0.025-0.3 % OP SOLN
2.0000 [drp] | Freq: Four times a day (QID) | OPHTHALMIC | Status: DC | PRN
  Filled 2011-09-28: qty 15

## 2011-09-28 NOTE — Progress Notes (Signed)
[facility] [place] TEAM 8  Subjective: 78 y.o. female with a past medical history of Diabetes mellitus and Hypertension who presented with 5-6 days of cold like symptoms and low grade fever.  I have spoken with the patient via her grandson who is at the bedside.  She reports that her sob is somewhat improved.    Objective: Weight change:   Intake/Output Summary (Last 24 hours) at 09/28/11 0928 Last data filed at 09/28/11 0600  Gross per 24 hour  Intake    100 ml  Output    350 ml  Net   -250 ml   Blood pressure 180/63, pulse 99, temperature 98 F (36.7 C), temperature source Oral, resp. rate 19, height 5\' 5"  (1.651 m), weight 62.9 kg (138 lb 10.7 oz), SpO2 100.00%.  Physical Exam: F/u exam completed  Lab Results:  [facility] 09/28/11 0700 09/27/11 2221  NA 135 135  K 4.3 3.0*  CL 98 98  CO2 20 27  GLUCOSE 389* 109*  BUN 22 24*  CREATININE 0.99 1.16*  CALCIUM 8.4 9.3  MG 1.6 --  PHOS 4.6 --    Basename 09/28/11 0700  AST 53*  ALT 51*  ALKPHOS 86  BILITOT 0.3  PROT 7.9  ALBUMIN 2.7*    Basename 09/28/11 0700 09/27/11 2221  WBC 6.1 5.1  NEUTROABS -- --  HGB 12.8 13.5  HCT 37.2 38.3  MCV 92.5 92.1  PLT 188 180    Basename 09/28/11 0700 09/27/11 2221  CKTOTAL 193* 250*  CKMB 4.6* 4.5*  CKMBINDEX -- --  TROPONINI <0.30 <0.30   No results found for this basename: HGBA1C:12 in the last 72 hours  Micro Results: Recent Results (from the past 240 hour(s))  MRSA PCR SCREENING     Status: Normal   Collection Time   09/28/11  3:50 AM      Component Value Range Status Comment   MRSA by PCR NEGATIVE  NEGATIVE  Final     Studies/Results: All recent x-ray/radiology reports have been reviewed in detail.   Medications: I have reviewed the patient's complete medication list.  Assessment/Plan:  Malignant HTN Needs signif titration of her medical tx  Dyspnea Appears to be multifactorial - continue steroids for now for possible RAD exac, but would  like to d/c asap given uncontrolled DM and HTN  Uncontrolled DM2 Diagnosed in 2009 - needs adjustment in tx plan  Hypokalemia Resolved w/ replacement  Pleuritic chest pain No evidence of PE on CT angio of chest   1.5cm L lobe thyroid nodule  Dementia  Reported hx of pelvic mass  Reported hx of secondary syphilis  Lonia Blood, MD [facility] Office  [phone] Pager [phone]  On-Call/Text Page:      [email or web address]      password [facility]

## 2011-09-28 NOTE — H&P (Signed)
PCP:  Max Sane   Chief Complaint:   Shortness of breath  HPI: Cynthia Todd is a 78 y.o. female   has a past medical history of Diabetes mellitus and Hypertension.   Presented with  5-6 days of cold like symptoms and low grade fever. She also had some nausea and vomitng as well. For the past few day this has progressed with worsening wheezing, cough productive of greenish sputum. She had harder time ambulating because of shortness of breath. She reports chest pain with coughing and taking deep breaths. No recent travel.   Review of Systems:    Pertinent positives include: Fevers, chills, nausea, vomiting, diarrhea, chest pain  Constitutional:  No weight loss, night sweats, fatigue.  HEENT:  No headaches, Difficulty swallowing,Tooth/dental problems,Sore throat,  No sneezing, itching, ear ache, nasal congestion, post nasal drip,  Cardio-vascular:   Orthopnea, PND, anasarca, dizziness, palpitations.no Bilateral lower extremity swelling  GI:  No heartburn, indigestion, abdominal pain, change in bowel habits, loss of appetite, melena, blood in stool, hematoemesis Resp:  no shortness of breath at rest. No dyspnea on exertion, No excess mucus, no productive cough, No non-productive cough, No coughing up of blood.No change in color of mucus.No wheezing.No chest wall deformity  Skin:  no rash or lesions.  GU:  no dysuria, change in color of urine, no urgency or frequency. No flank pain.  Musculoskeletal:  No joint pain or swelling. No decreased range of motion. No back pain.  Psych:  No change in mood or affect. No depression or anxiety. No memory loss.  Neuro: no localizing neurological complaints, no tingling, no weakness, no double vision, no gait abnormality, no slurred speech   Otherwise ROS are negative except for above, 10 systems were reviewed  Past Medical History: Past Medical History  Diagnosis Date  . Diabetes mellitus   . Hypertension    History reviewed. No  pertinent past surgical history.   Medications: Prior to Admission medications   Medication Sig Start Date End Date Taking? Authorizing Provider  acetaminophen (TYLENOL) 500 MG tablet Take 500 mg by mouth at bedtime as needed. For pain/headache   Yes Historical Provider, MD  glimepiride (AMARYL) 4 MG tablet Take 4 mg by mouth daily before breakfast.   Yes Historical Provider, MD  insulin glargine (LANTUS) 100 UNIT/ML injection Inject 100 Units into the skin at bedtime.   Yes Historical Provider, MD  levothyroxine (SYNTHROID, LEVOTHROID) 50 MCG tablet Take 50 mcg by mouth daily.   Yes Historical Provider, MD  naphazoline (CLEAR EYES) 0.012 % ophthalmic solution Place 2 drops into both eyes 4 (four) times daily as needed. For irritation   Yes Historical Provider, MD  nebivolol (BYSTOLIC) 5 MG tablet Take 5 mg by mouth daily.   Yes Historical Provider, MD    Allergies:  No Known Allergies  Social History:  Ambulatory independetly Lives at home with family    reports that she has never smoked. She does not have any smokeless tobacco history on file. She reports that she does not drink alcohol.   Family History: family history is not on file.    Physical Exam: Patient Vitals for the past 24 hrs:  BP Temp Temp src Pulse Resp SpO2  09/28/11 0012 242/92 mmHg - - - - -  09/27/11 2303 238/90 mmHg - - - - -  09/27/11 2300 238/90 mmHg - - 63  26  97 %  09/27/11 2230 236/70 mmHg - - 68  22  98 %  09/27/11  2145 214/69 mmHg - - 71  - 97 %  09/27/11 2023 232/89 mmHg - - 74  22  95 %  09/27/11 2022 - - - - - 97 %  09/27/11 1923 215/70 mmHg - - - - -  09/27/11 1915 225/78 mmHg 98.1 F (36.7 C) Oral 62  54  97 %    1. General:  in No Acute distress 2. Psychological: Alert and Oriented 3. Head/ENT:   Moist Mucous Membranes                          Head Non traumatic, neck supple                          Normal  Dentition 4. SKIN: normal  Skin turgor,  Skin clean Dry and intact no rash 5.  Heart: Regular rate and rhythm no Murmur, Rub or gallop 6. Lungs:wheezes bilaterally no crackles   7. Abdomen: Soft, non-tender, Non distended 8. Lower extremities: no clubbing, cyanosis, or edema 9. Neurologically Grossly intact, moving all 4 extremities equally 10. MSK: Normal range of motion  body mass index is unknown because there is no height or weight on file.   Labs on Admission:   [facility] 09/27/11 2221  NA 135  K 3.0*  CL 98  CO2 27  GLUCOSE 109*  BUN 24*  CREATININE 1.16*  CALCIUM 9.3  MG --  PHOS --   No results found for this basename: AST:2,ALT:2,ALKPHOS:2,BILITOT:2,PROT:2,ALBUMIN:2 in the last 72 hours No results found for this basename: LIPASE:2,AMYLASE:2 in the last 72 hours  Basename 09/27/11 2221  WBC 5.1  NEUTROABS --  HGB 13.5  HCT 38.3  MCV 92.1  PLT 180    Basename 09/27/11 2221  CKTOTAL 250*  CKMB 4.5*  CKMBINDEX --  TROPONINI <0.30   No results found for this basename: TSH,T4TOTAL,FREET3,T3FREE,THYROIDAB in the last 72 hours No results found for this basename: VITAMINB12:2,FOLATE:2,FERRITIN:2,TIBC:2,IRON:2,RETICCTPCT:2 in the last 72 hours Lab Results  Component Value Date   HGBA1C  Value: (NOTE) Test Procedure               Results       Units       Normal Range Hemoglobin A1c w Est Avg/Me Hemoglobin A1C               TNP           %           4.6-6.1  (performed at MAIN) Unable to determine HgbA1C using standard method due to  interference of probable abnormal hemoglobin. Suggest Glycohemoglobin, Total (16109).  06/26/2008    CrCl is unknown because there is no height on file for the current visit. ABG No results found for this basename: phart, pco2, po2, hco3, tco2, acidbasedef, o2sat     No results found for this basename: DDIMER     Other results:  I have pearsonaly reviewed this: ECG REPORT  Rate:70   Rhythm: SR ST&T Change: no ischemic changes  UA no infection BNP 1438.0  Cultures: No results found for this basename:  sdes, specrequest, cult, reptstatus       Radiological Exams on Admission: Dg Chest 2 View  09/27/2011  *RADIOLOGY REPORT*  Clinical Data: 78 year old female with shortness of breath.  CHEST - 2 VIEW  Comparison: CT abdomen 04/14/2006.  Findings: Small bilateral pleural effusions.  Stable mild elevation of the right hemidiaphragm.  Pulmonary vascular congestion.  No consolidation.  No pneumothorax.  Trace fluid in the right minor fissure.  Calcified atherosclerosis of the aorta. No acute osseous abnormality identified.  IMPRESSION: Small pleural effusions.  Vascular congestion without overt pulmonary edema.  Original Report Authenticated By: Harley Hallmark, M.D.    Assessment/Plan  78 yo F here with wheezing, pleuritic chest pain, no evidence of PNA no Hx of Asthma or COPD.  Present on Admission:  .Shortness of breath - likely multifactorial, No known history of Asthma or COPD. CXR not consistent with overt failure but BNP somewhat up. This could be still reactive airway disease. Given pleuretic chest pain and unclear picture will obtain CT angio of the chest to RO PE, cycle cardiac enzymes, obtain echo Cover with steroids and Avelox for now until lung disease an be ruled out.  Accelerated Hypertension - patient states she has been taking her meds. Hydralazine PRN for now if persists may need to start a drip. Marland KitchenHypokalemia - replace, check magnesium .Chest pain on breathing - CT angio of the chest pending .Diabetes mellitus - SSI, unclear about her home dose of insuline family to clarify in Am if it is really 100 units at night   Prophylaxis: Lovenox, Protonix  CODE STATUS: FULL CODE for now   Bynum Mccullars 09/28/2011, 1:56 AM

## 2011-09-28 NOTE — ED Provider Notes (Signed)
History     CSN: [id number]  Arrival date & time 09/27/11  1909   First MD Initiated Contact with Patient 09/27/11 2125      Chief Complaint  Patient presents with  . Shortness of Breath    (Consider location/radiation/quality/duration/timing/severity/associated sxs/prior treatment) Patient is a 78 y.o. female presenting with shortness of breath. The history is provided by the patient and a relative. A language interpreter was used.  Shortness of Breath  Associated symptoms include shortness of breath.   Patient presents the emergency department with shortness of breath since Wednesday.  She is to see her primary care doctor Friday but the office was closed due to weather.  The daughter states that she been having cough and fever as well.  Patient does not have any abdominal pain, nausea, vomiting, diarrhea, or headache.  Patient states she has had some chest pain with this. Past Medical History  Diagnosis Date  . Diabetes mellitus   . Hypertension     History reviewed. No pertinent past surgical history.  History reviewed. No pertinent family history.  History  Substance Use Topics  . Smoking status: Never Smoker   . Smokeless tobacco: Not on file  . Alcohol Use: No    OB History    Grav Para Term Preterm Abortions TAB SAB Ect Mult Living                  Review of Systems  Respiratory: Positive for shortness of breath.    All pertinent positives and negatives reviewed in the history of present illness  Allergies  Review of patient's allergies indicates no known allergies.  Home Medications   Current Outpatient Rx  Name Route Sig Dispense Refill  . ACETAMINOPHEN 500 MG PO TABS Oral Take 500 mg by mouth at bedtime as needed. For pain/headache    . GLIMEPIRIDE 4 MG PO TABS Oral Take 4 mg by mouth daily before breakfast.    . INSULIN GLARGINE 100 UNIT/ML [place] SOLN Subcutaneous Inject 100 Units into the skin at bedtime.    Marland Kitchen LEVOTHYROXINE SODIUM 50 MCG PO TABS  Oral Take 50 mcg by mouth daily.    Marland Kitchen NAPHAZOLINE HCL 0.012 % OP SOLN Both Eyes Place 2 drops into both eyes 4 (four) times daily as needed. For irritation    . NEBIVOLOL HCL 5 MG PO TABS Oral Take 5 mg by mouth daily.      BP 242/92  Pulse 63  Temp(Src) 98.1 F (36.7 C) (Oral)  Resp 26  SpO2 97%  Physical Exam  Constitutional: She appears well-developed and well-nourished. No distress.  HENT:  Head: Normocephalic and atraumatic.  Eyes: Pupils are equal, round, and reactive to light.  Neck: Normal range of motion. Neck supple.  Cardiovascular: Normal rate, regular rhythm and normal heart sounds.  Exam reveals no gallop and no friction rub.   No murmur heard. Pulmonary/Chest: Effort normal. No respiratory distress. She has wheezes. She has no rales.  Abdominal: Soft. Bowel sounds are normal.  Neurological: She is alert.  Skin: Skin is warm and dry. No rash noted. No erythema.    ED Course  Procedures (including critical care time)  Labs Reviewed  CARDIAC PANEL(CRET KIN+CKTOT+MB+TROPI) - Abnormal; Notable for the following:    Total CK 250 (*)    CK, MB 4.5 (*)    All other components within normal limits  BASIC METABOLIC PANEL - Abnormal; Notable for the following:    Potassium 3.0 (*)    Glucose,  Bld 109 (*)    BUN 24 (*)    Creatinine, Ser 1.16 (*)    GFR calc non Af Amer 40 (*)    GFR calc Af Amer 46 (*)    All other components within normal limits  CBC  URINALYSIS, ROUTINE W REFLEX MICROSCOPIC  PRO B NATRIURETIC PEPTIDE   Dg Chest 2 View  09/27/2011  *RADIOLOGY REPORT*  Clinical Data: 78 year old female with shortness of breath.  CHEST - 2 VIEW  Comparison: CT abdomen 04/14/2006.  Findings: Small bilateral pleural effusions.  Stable mild elevation of the right hemidiaphragm.  Pulmonary vascular congestion.  No consolidation.  No pneumothorax.  Trace fluid in the right minor fissure.  Calcified atherosclerosis of the aorta. No acute osseous abnormality identified.   IMPRESSION: Small pleural effusions.  Vascular congestion without overt pulmonary edema.  Original Report Authenticated By: Harley Hallmark, M.D.   She received an initial regular note that did not provide any relief.  Patient is working on an hour-long treatment.  She is not feeling any better at this time.  Secret to try hospitalist admission for this patient.    Date: 09/28/2011  Rate:70  Rhythm: normal sinus rhythm  QRS Axis: normal  Intervals: normal  ST/T Wave abnormalities: normal  Conduction Disutrbances:none  Narrative Interpretation:   Old EKG Reviewed: unchanged    MDM          Carlyle Dolly, PA-C 09/28/11 1507

## 2011-09-29 DIAGNOSIS — I5030 Unspecified diastolic (congestive) heart failure: Secondary | ICD-10-CM | POA: Diagnosis present

## 2011-09-29 DIAGNOSIS — I272 Pulmonary hypertension, unspecified: Secondary | ICD-10-CM | POA: Diagnosis present

## 2011-09-29 LAB — CBC
HCT: 32.5 % — ABNORMAL LOW (ref 36.0–46.0)
RDW: 13.4 % (ref 11.5–15.5)
WBC: 13.5 10*3/uL — ABNORMAL HIGH (ref 4.0–10.5)

## 2011-09-29 LAB — GLUCOSE, CAPILLARY
Glucose-Capillary: 241 mg/dL — ABNORMAL HIGH (ref 70–99)
Glucose-Capillary: 107 mg/dL — ABNORMAL HIGH (ref 70–99)

## 2011-09-29 LAB — BASIC METABOLIC PANEL
Chloride: 102 mEq/L (ref 96–112)
Creatinine, Ser: 1.39 mg/dL — ABNORMAL HIGH (ref 0.50–1.10)
GFR calc Af Amer: 37 mL/min — ABNORMAL LOW (ref >90)
Potassium: 4.2 mEq/L (ref 3.5–5.1)
Sodium: 136 mEq/L (ref 135–145)

## 2011-09-29 MED ORDER — INSULIN GLARGINE 100 UNIT/ML ~~LOC~~ SOLN
75.0000 [IU] | Freq: Every day | SUBCUTANEOUS | Status: DC
  Administered 2011-09-29 – 2011-10-01 (×3): 75 [IU] via SUBCUTANEOUS

## 2011-09-29 MED ORDER — NEBIVOLOL HCL 5 MG PO TABS
5.0000 mg | ORAL_TABLET | Freq: Every day | ORAL | Status: DC
  Administered 2011-09-29 – 2011-10-02 (×4): 5 mg via ORAL
  Filled 2011-09-29 (×5): qty 1

## 2011-09-29 MED ORDER — INSULIN ASPART 100 UNIT/ML ~~LOC~~ SOLN
8.0000 [IU] | Freq: Three times a day (TID) | SUBCUTANEOUS | Status: DC
  Administered 2011-09-29 – 2011-10-02 (×9): 8 [IU] via SUBCUTANEOUS

## 2011-09-29 MED ORDER — HYDRALAZINE HCL 25 MG PO TABS
25.0000 mg | ORAL_TABLET | Freq: Four times a day (QID) | ORAL | Status: DC
  Administered 2011-09-29 – 2011-10-02 (×13): 25 mg via ORAL
  Filled 2011-09-29 (×17): qty 1

## 2011-09-29 MED ORDER — NITROGLYCERIN IN D5W 200-5 MCG/ML-% IV SOLN
2.0000 ug/min | INTRAVENOUS | Status: DC
  Filled 2011-09-29: qty 250

## 2011-09-29 NOTE — Progress Notes (Signed)
Report called to [facility] on 2000. Patient transported via wheelchair to room 2030.

## 2011-09-29 NOTE — Progress Notes (Signed)
   CARE MANAGEMENT NOTE 09/29/2011  Patient:  Cynthia Todd, Cynthia Todd   Account Number:  [id number]  Date Initiated:  09/29/2011  Documentation initiated by:  Onnie Boer  Subjective/Objective Assessment:   PT WAS ADMITTED WITH HTN, SOB     Action/Plan:   PROGRESSION OF CARE AND DISCHARGE PLANNING   Anticipated DC Date:  10/03/2011   Anticipated DC Plan:  HOME W HOME HEALTH SERVICES      DC Planning Services  CM consult      Choice offered to / List presented to:             Status of service:  In process, will continue to follow Medicare Important Message given?   (If response is "NO", the following Medicare IM given date fields will be blank) Date Medicare IM given:   Date Additional Medicare IM given:    Discharge Disposition:    Per UR Regulation:  Reviewed for med. necessity/level of care/duration of stay  Comments:  UR COMPLETED 09/29/2011 Onnie Boer, RN, BSN 1123 PT WAS ADMITTED WITH HTN, SOB.  PTA PT WAS AT HOME WITH FAMILY.  WILL F/U ON DC NEEDS

## 2011-09-29 NOTE — Progress Notes (Signed)
*  PRELIMINARY RESULTS* Echocardiogram 2D Echocardiogram has been performed.  Cathie Beams Deneen 09/29/2011, 9:37 AM

## 2011-09-29 NOTE — ED Provider Notes (Signed)
Medical screening examination/treatment/procedure(s) were conducted as a shared visit with non-physician practitioner(s) and myself.  I personally evaluated the patient during the encounter  Doug Sou, MD 09/29/11 [phone]

## 2011-09-29 NOTE — Progress Notes (Signed)
[facility] [place] TEAM 8  Subjective: 78 y.o. female with a past medical history of Diabetes mellitus and Hypertension who presented with 5-6 days of cold like symptoms and low grade fever.  Translation for the patient is provided by her grandson who is at the bedside.  She endorses that she feels much better especially in regards to her breathing. Her grandson confirms that she looks better. Denies chest pain or any other symptoms.  Objective: Weight change: -0.1 kg (-3.5 oz)  Intake/Output Summary (Last 24 hours) at 09/29/11 1050 Last data filed at 09/29/11 1043  Gross per 24 hour  Intake   1514 ml  Output   2101 ml  Net   -587 ml   Blood pressure 138/116, pulse 104, temperature 97.8 F (36.6 C), temperature source Oral, resp. rate 18, height 5\' 5"  (1.651 m), weight 62.8 kg (138 lb 7.2 oz), SpO2 99.00%.  Physical Exam: General appearance: alert, cooperative, appears stated age and no distress Resp: Mild bilateral expiratory wheezing without crackles Cards:  S1, S2 normal, no murmur, click, rub or gallop GI: soft, non-tender; bowel sounds normal; no masses,  no organomegaly Extremities: extremities normal, atraumatic, no cyanosis or edema Neurologic: Grossly normal and exam is nonfocal, neuropsych exam limited by language barrier and need to utilize grandson as translator-patient is [place]  Lab Results:  Basename 09/29/11 0525 09/28/11 0700 09/27/11 2221  NA 136 135 135  K 4.2 4.3 3.0*  CL 102 98 98  CO2 24 20 27   GLUCOSE 262* 389* 109*  BUN 37* 22 24*  CREATININE 1.39* 0.99 1.16*  CALCIUM 8.8 8.4 9.3  MG -- 1.6 --  PHOS -- 4.6 --    Basename 09/28/11 0700  AST 53*  ALT 51*  ALKPHOS 86  BILITOT 0.3  PROT 7.9  ALBUMIN 2.7*    Basename 09/29/11 0525 09/28/11 0700 09/27/11 2221  WBC 13.5* 6.1 5.1  NEUTROABS -- -- --  HGB 11.2* 12.8 13.5  HCT 32.5* 37.2 38.3  MCV 92.1 92.5 92.1  PLT 188 188 180    Basename 09/28/11 1146 09/28/11 0700 09/27/11  2221  CKTOTAL 187* 193* 250*  CKMB 5.2* 4.6* 4.5*  CKMBINDEX -- -- --  TROPONINI <0.30 <0.30 <0.30    Basename 09/28/11 0700  HGBA1C 9.0*    Micro Results: Recent Results (from the past 240 hour(s))  MRSA PCR SCREENING     Status: Normal   Collection Time   09/28/11  3:50 AM      Component Value Range Status Comment   MRSA by PCR NEGATIVE  NEGATIVE  Final    Medications:   I have reviewed the patient's current medications.  Assessment/Plan:  Malignant HTN/grade 1 diastolic dysfunction/moderate pulmonary hypertension We'll change hydralazine to oral route and resume her home Bysystolic. We'll titrate and discontinue IV nitroglycerin. Echocardiogram demonstrates hyperdynamic ejection fraction of 65-70% and no regional wall motion abnormalities no LVH but parameters are consistent with grade 1 diastolic dysfunction. In addition she was found to have moderate pulmonary hypertension of 53 mmHg. Given both of these findings she will need to be on afterload reducing agents at discharge.  Dyspnea Felt to be due to pulmonary edema and "cardiac wheeze" - discontinue steroids since it is felt that the wheezing is related to volume overload and not related to RAD exacerbation; we are also weaning the oxygen. Discontinue Avelox  Uncontrolled DM2 Diagnosed in 2009 - on Lantus 100 units at home-currently on 50 units here with CBGs greater than 225  so we'll increase Lantus to 75 and increase SSI, continue Amaryl. Likely hyperglycemia is related to current use of steroids.  Leukocytosis No fever and is likely due to be demargination from steroids.  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 I have reviewed the above note, made any necessary editorial changes, and agree with its content.  Lonia Blood, MD [facility]

## 2011-09-30 LAB — GLUCOSE, CAPILLARY: Glucose-Capillary: 77 mg/dL (ref 70–99)

## 2011-09-30 LAB — BASIC METABOLIC PANEL
BUN: 48 mg/dL — ABNORMAL HIGH (ref 6–23)
Calcium: 8.6 mg/dL (ref 8.4–10.5)
GFR calc non Af Amer: 31 mL/min — ABNORMAL LOW (ref >90)
Glucose, Bld: 161 mg/dL — ABNORMAL HIGH (ref 70–99)
Sodium: 136 mEq/L (ref 135–145)

## 2011-09-30 MED ORDER — MOXIFLOXACIN HCL 400 MG PO TABS
400.0000 mg | ORAL_TABLET | Freq: Every day | ORAL | Status: DC
  Administered 2011-09-30 – 2011-10-01 (×2): 400 mg via ORAL
  Filled 2011-09-30 (×3): qty 1

## 2011-09-30 MED ORDER — IPRATROPIUM BROMIDE 0.02 % IN SOLN
0.5000 mg | Freq: Three times a day (TID) | RESPIRATORY_TRACT | Status: DC
  Administered 2011-09-30 – 2011-10-02 (×5): 0.5 mg via RESPIRATORY_TRACT
  Filled 2011-09-30 (×5): qty 2.5

## 2011-09-30 MED ORDER — PREDNISONE 50 MG PO TABS
60.0000 mg | ORAL_TABLET | Freq: Every day | ORAL | Status: DC
  Administered 2011-10-01 – 2011-10-02 (×2): 60 mg via ORAL
  Filled 2011-09-30 (×4): qty 1

## 2011-09-30 MED ORDER — LEVALBUTEROL HCL 0.63 MG/3ML IN NEBU
0.6300 mg | INHALATION_SOLUTION | Freq: Three times a day (TID) | RESPIRATORY_TRACT | Status: DC
  Administered 2011-09-30 – 2011-10-02 (×5): 0.63 mg via RESPIRATORY_TRACT
  Filled 2011-09-30 (×8): qty 3

## 2011-09-30 NOTE — Progress Notes (Signed)
[facility] [place] TEAM 8  Subjective: 78 y.o. female with a past medical history of Diabetes mellitus and Hypertension who presented with 5-6 days of cold like symptoms and low grade fever.  Daughter at bedside, helping with interpreting. Less shortness of breath patient is still wheezing.  Objective: Weight change:   Intake/Output Summary (Last 24 hours) at 09/30/11 1705 Last data filed at 09/29/11 2100  Gross per 24 hour  Intake    360 ml  Output      0 ml  Net    360 ml   Blood pressure 138/65, pulse 71, temperature 98.3 F (36.8 C), temperature source Oral, resp. rate 18, height 5\' 5"  (1.651 m), weight 62.8 kg (138 lb 7.2 oz), SpO2 99.00%.  Physical Exam: General appearance: alert, cooperative, appears stated age and no distress Resp: Mild bilateral expiratory wheezing without crackles Cards:  S1, S2 normal, no murmur, click, rub or gallop GI: soft, non-tender; bowel sounds normal; no masses,  no organomegaly Extremities: extremities normal, atraumatic, no cyanosis or edema Neurologic: Grossly normal and exam is nonfocal, neuropsych exam limited by language barrier and need to utilize grandson as translator-patient is [place]  Lab Results:  Basename 09/30/11 0538 09/29/11 0525 09/28/11 0700  NA 136 136 135  K 3.9 4.2 4.3  CL 102 102 98  CO2 26 24 20   GLUCOSE 161* 262* 389*  BUN 48* 37* 22  CREATININE 1.42* 1.39* 0.99  CALCIUM 8.6 8.8 8.4  MG -- -- 1.6  PHOS -- -- 4.6    Basename 09/28/11 0700  AST 53*  ALT 51*  ALKPHOS 86  BILITOT 0.3  PROT 7.9  ALBUMIN 2.7*    Basename 09/29/11 0525 09/28/11 0700 09/27/11 2221  WBC 13.5* 6.1 5.1  NEUTROABS -- -- --  HGB 11.2* 12.8 13.5  HCT 32.5* 37.2 38.3  MCV 92.1 92.5 92.1  PLT 188 188 180    Basename 09/28/11 1146 09/28/11 0700 09/27/11 2221  CKTOTAL 187* 193* 250*  CKMB 5.2* 4.6* 4.5*  CKMBINDEX -- -- --  TROPONINI <0.30 <0.30 <0.30    Basename 09/28/11 0700  HGBA1C 9.0*    Micro  Results: Recent Results (from the past 240 hour(s))  MRSA PCR SCREENING     Status: Normal   Collection Time   09/28/11  3:50 AM      Component Value Range Status Comment   MRSA by PCR NEGATIVE  NEGATIVE  Final    Medications:   I have reviewed the patient's current medications.  Assessment/Plan:  Malignant HTN/grade 1 diastolic dysfunction/moderate pulmonary hypertension We'll change hydralazine to oral route and resume her home Bysystolic. We'll titrate and discontinue IV nitroglycerin. Echocardiogram demonstrates hyperdynamic ejection fraction of 65-70% and no regional wall motion abnormalities no LVH but parameters are consistent with grade 1 diastolic dysfunction. In addition she was found to have moderate pulmonary hypertension of 53 mmHg. Given both of these findings she will need to be on afterload reducing agents at discharge.  Dyspnea The wheezing felt initially to be secondary to cardiac wheezing, the antibiotics and steroids were discontinued hoping that after the diuresis the wheezing will resolve. Patient still have wheezing I will restart the antibiotics/steroids. Probably patient has acute bronchitis as is not showing in the x-ray.   Leukocytosis No fever and is likely due to be demargination from steroids.  Hypokalemia Resolved w/ replacement  Renal insuff  creatinine increased after aggressive diuresis. Patient seems that she have a touch of CKD was chronic elevation of  her creatinine. We'll monitor closely and check BMP in the morning no more Lasix given.   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

## 2011-10-01 LAB — CBC
HCT: 36 % (ref 36.0–46.0)
Hemoglobin: 12 g/dL (ref 12.0–15.0)
MCH: 30.9 pg (ref 26.0–34.0)
MCHC: 33.3 g/dL (ref 30.0–36.0)
MCV: 92.8 fL (ref 78.0–100.0)
Platelets: 205 K/uL (ref 150–400)
RBC: 3.88 MIL/uL (ref 3.87–5.11)
RDW: 13.5 % (ref 11.5–15.5)
WBC: 9.6 K/uL (ref 4.0–10.5)

## 2011-10-01 LAB — INFLUENZA PANEL BY PCR (TYPE A & B)
H1N1 flu by pcr: NOT DETECTED
Influenza A By PCR: POSITIVE — AB
Influenza B By PCR: NEGATIVE

## 2011-10-01 LAB — BASIC METABOLIC PANEL
CO2: 26 mEq/L (ref 19–32)
GFR calc non Af Amer: 33 mL/min — ABNORMAL LOW (ref >90)
Glucose, Bld: 154 mg/dL — ABNORMAL HIGH (ref 70–99)
Potassium: 3.9 mEq/L (ref 3.5–5.1)
Sodium: 135 mEq/L (ref 135–145)

## 2011-10-01 LAB — GLUCOSE, CAPILLARY: Glucose-Capillary: 290 mg/dL — ABNORMAL HIGH (ref 70–99)

## 2011-10-01 MED ORDER — OSELTAMIVIR PHOSPHATE 75 MG PO CAPS
75.0000 mg | ORAL_CAPSULE | Freq: Every day | ORAL | Status: DC
  Administered 2011-10-01 – 2011-10-02 (×2): 75 mg via ORAL
  Filled 2011-10-01 (×2): qty 1

## 2011-10-01 MED ORDER — ENOXAPARIN SODIUM 30 MG/0.3ML ~~LOC~~ SOLN
30.0000 mg | SUBCUTANEOUS | Status: DC
  Filled 2011-10-01: qty 0.3

## 2011-10-01 MED ORDER — FUROSEMIDE 20 MG PO TABS
20.0000 mg | ORAL_TABLET | Freq: Two times a day (BID) | ORAL | Status: DC
  Administered 2011-10-01 – 2011-10-02 (×3): 20 mg via ORAL
  Filled 2011-10-01 (×7): qty 1

## 2011-10-02 DIAGNOSIS — J111 Influenza due to unidentified influenza virus with other respiratory manifestations: Secondary | ICD-10-CM | POA: Diagnosis present

## 2011-10-02 LAB — GLUCOSE, CAPILLARY
Glucose-Capillary: 70 mg/dL (ref 70–99)
Glucose-Capillary: 111 mg/dL — ABNORMAL HIGH (ref 70–99)

## 2011-10-02 MED ORDER — OSELTAMIVIR PHOSPHATE 75 MG PO CAPS: 75.0000 mg | ORAL_CAPSULE | Freq: Every day | ORAL | Status: AC

## 2011-10-02 MED ORDER — OSELTAMIVIR PHOSPHATE 75 MG PO CAPS: 75.0000 mg | ORAL_CAPSULE | Freq: Every day | ORAL | Status: DC

## 2011-10-02 MED ORDER — HYDRALAZINE HCL 25 MG PO TABS: 25.0000 mg | ORAL_TABLET | Freq: Three times a day (TID) | ORAL | Status: DC

## 2011-10-02 MED ORDER — INSULIN GLARGINE 100 UNIT/ML ~~LOC~~ SOLN: 85.0000 [IU] | Freq: Every day | SUBCUTANEOUS | Status: DC

## 2011-10-02 MED ORDER — ALBUTEROL 90 MCG/ACT IN AERS: 2.0000 | INHALATION_SPRAY | Freq: Four times a day (QID) | RESPIRATORY_TRACT | Status: DC | PRN

## 2011-10-02 MED ORDER — FUROSEMIDE 20 MG PO TABS: 20.0000 mg | ORAL_TABLET | Freq: Every day | ORAL | Status: DC

## 2011-10-02 NOTE — Discharge Summary (Signed)
Physician Discharge Summary  Patient ID: Cynthia Todd MRN: [id number] DOB/AGE: 1919/06/20 78 y.o.  Admit date: 09/27/2011 Discharge date: 10/02/2011  Primary Care Physician: Dr. Fleet Contras, MD  Discharge Diagnoses:   1. influenza A.  2. reactive airway disease 3. acute on chronic diastolic CHF  4. malignant hypertension 5. Diabetes Mellitus type 6. Pulmonary hypertension 7. Hypokalemia  Medication List  As of 10/02/2011  4:19 PM   TAKE these medications         acetaminophen 500 MG tablet   Commonly known as: TYLENOL   Take 500 mg by mouth at bedtime as needed. For pain/headache      albuterol 90 MCG/ACT inhaler   Commonly known as: PROVENTIL,VENTOLIN   Inhale 2 puffs into the lungs every 6 (six) hours as needed for wheezing.      furosemide 20 MG tablet   Commonly known as: LASIX   Take 1 tablet (20 mg total) by mouth daily.      glimepiride 4 MG tablet   Commonly known as: AMARYL   Take 4 mg by mouth daily before breakfast.      hydrALAZINE 25 MG tablet   Commonly known as: APRESOLINE   Take 1 tablet (25 mg total) by mouth 3 (three) times daily.      insulin glargine 100 UNIT/ML injection   Commonly known as: LANTUS   Inject 85 Units into the skin at bedtime.      levothyroxine 50 MCG tablet   Commonly known as: SYNTHROID, LEVOTHROID   Take 50 mcg by mouth daily.      naphazoline 0.012 % ophthalmic solution   Commonly known as: CLEAR EYES   Place 2 drops into both eyes 4 (four) times daily as needed. For irritation      nebivolol 5 MG tablet   Commonly known as: BYSTOLIC   Take 5 mg by mouth daily.      oseltamivir 75 MG capsule   Commonly known as: TAMIFLU   Take 1 capsule (75 mg total) by mouth daily. For 4 days             Disposition and Follow-up:  PCP in 1 week  Consults:  None  Significant Diagnostic Studies:  Dg Chest 2 View 09/27/2011    IMPRESSION: Small pleural effusions.  Vascular congestion without overt pulmonary edema.   Original Report Authenticated By: Harley Hallmark, M.D.   Ct Angio Chest W/cm &/or Wo Cm 09/28/2011 .  IMPRESSION: No evidence of significant pulmonary embolus.  Original Report Authenticated By: Marlon Pel, M.D.    Brief H and P: Cynthia Todd he is a 78 year old female with history of diabetes, hypertension presented to the ER with 4 days of flulike symptoms, low-grade fever, wheezing and cough productive of greenish sputum.  Hospital Course:  1. Bronchitis with reactive airway disease: This was treated with IV Avelox and steroids while she was in step down unit, As well as Lasix IV because of the concern for a possible cardiac wheeze. The only transferred to the floor neck clinically improved condition, still wheezing and I ordered an influenza PCR which came back positive and started on Tamiflu yesterday, will continue this for 4 more days for a five-day course. She's weaned off steroids, described albuterol when necessary for wheezes till she gets over this flu. In terms of her grade 1 diastolic CHF: Lasix was changed to by mouth, she continued on bystolic, is discharged home on low-dose Lasix which can be slowly decreased  to every other day upon followup with her PCP if suitable.   Time spent on Discharge:  Signed: Chey Rachels [facility]  10/02/2011, 4:19 PM

## 2011-10-02 NOTE — Progress Notes (Signed)
[facility] [place] TEAM 8  Subjective: 78 y.o. female with a past medical history of Diabetes mellitus and Hypertension who presented with 5-6 days of cold like symptoms and low grade fever.  Daughter at bedside, helping with interpreting. Less shortness of breath patient is still wheezing.  Objective: Weight change:   Intake/Output Summary (Last 24 hours) at 10/02/11 1134 Last data filed at 10/02/11 0730  Gross per 24 hour  Intake    200 ml  Output    350 ml  Net   -150 ml   Blood pressure 138/85, pulse 65, temperature 97.9 F (36.6 C), temperature source Oral, resp. rate 18, height 5\' 5"  (1.651 m), weight 62.8 kg (138 lb 7.2 oz), SpO2 96.00%.  Physical Exam: General appearance: alert, cooperative, appears stated age and no distress Resp: no wheezes, or crackles Cards:  S1, S2 normal, no murmur, click, rub or gallop GI: soft, non-tender; bowel sounds normal; no masses,  no organomegaly Extremities: extremities normal, atraumatic, no cyanosis or edema Neurologic: Grossly normal and exam is nonfocal, neuropsych exam limited by language barrier and need to utilize grandson as translator-patient is [place]  Lab Results:  Basename 10/01/11 0510 09/30/11 0538  NA 135 136  K 3.9 3.9  CL 101 102  CO2 26 26  GLUCOSE 154* 161*  BUN 44* 48*  CREATININE 1.36* 1.42*  CALCIUM 8.6 8.6  MG -- --  PHOS -- --   No results found for this basename: AST:2,ALT:2,ALKPHOS:2,BILITOT:2,PROT:2,ALBUMIN:2 in the last 72 hours  Basename 10/01/11 0510  WBC 9.6  NEUTROABS --  HGB 12.0  HCT 36.0  MCV 92.8  PLT 205   No results found for this basename: CKTOTAL:3,CKMB:3,CKMBINDEX:3,TROPONINI:3 in the last 72 hours No results found for this basename: HGBA1C:12 in the last 72 hours  Micro Results: Recent Results (from the past 240 hour(s))  MRSA PCR SCREENING     Status: Normal   Collection Time   09/28/11  3:50 AM      Component Value Range Status Comment   MRSA by PCR NEGATIVE   NEGATIVE  Final    Medications:   I have reviewed the patient's current medications.  Assessment/Plan:  Malignant HTN/grade 1 diastolic dysfunction/moderate pulmonary hypertension Continue hydralazine, bystolic continue low dose lasix  Dyspnea Multifactorial, reactive airway dz and possible FLU, and mild chf Check influenza PCR, po lasix  Leukocytosis No fever and is likely due to be demargination from steroids.  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

## 2011-10-03 LAB — GLUCOSE, CAPILLARY

## 2011-11-22 ENCOUNTER — Other Ambulatory Visit (HOSPITAL_COMMUNITY): Payer: Self-pay | Admitting: Internal Medicine

## 2013-06-13 ENCOUNTER — Encounter (HOSPITAL_COMMUNITY): Payer: Self-pay | Admitting: Emergency Medicine

## 2013-06-13 ENCOUNTER — Emergency Department (HOSPITAL_COMMUNITY): Payer: Medicare Other

## 2013-06-13 ENCOUNTER — Inpatient Hospital Stay (HOSPITAL_COMMUNITY)
Admission: EM | Admit: 2013-06-13 | Discharge: 2013-06-15 | DRG: 291 | Disposition: A | Discharge status: Home / Self Care | Payer: Medicare Other | Attending: Family Medicine | Admitting: Family Medicine

## 2013-06-13 DIAGNOSIS — IMO0001 Reserved for inherently not codable concepts without codable children: Secondary | ICD-10-CM

## 2013-06-13 DIAGNOSIS — Z23 Encounter for immunization: Secondary | ICD-10-CM

## 2013-06-13 DIAGNOSIS — E1149 Type 2 diabetes mellitus with other diabetic neurological complication: Secondary | ICD-10-CM | POA: Diagnosis present

## 2013-06-13 DIAGNOSIS — J811 Chronic pulmonary edema: Secondary | ICD-10-CM

## 2013-06-13 DIAGNOSIS — I5033 Acute on chronic diastolic (congestive) heart failure: Principal | ICD-10-CM | POA: Diagnosis present

## 2013-06-13 DIAGNOSIS — I2789 Other specified pulmonary heart diseases: Secondary | ICD-10-CM

## 2013-06-13 DIAGNOSIS — I509 Heart failure, unspecified: Secondary | ICD-10-CM

## 2013-06-13 DIAGNOSIS — E1121 Type 2 diabetes mellitus with diabetic nephropathy: Secondary | ICD-10-CM | POA: Diagnosis present

## 2013-06-13 DIAGNOSIS — R071 Chest pain on breathing: Secondary | ICD-10-CM

## 2013-06-13 DIAGNOSIS — J9600 Acute respiratory failure, unspecified whether with hypoxia or hypercapnia: Secondary | ICD-10-CM

## 2013-06-13 DIAGNOSIS — R0602 Shortness of breath: Secondary | ICD-10-CM

## 2013-06-13 DIAGNOSIS — E1142 Type 2 diabetes mellitus with diabetic polyneuropathy: Secondary | ICD-10-CM | POA: Diagnosis present

## 2013-06-13 DIAGNOSIS — Z794 Long term (current) use of insulin: Secondary | ICD-10-CM

## 2013-06-13 DIAGNOSIS — Z79899 Other long term (current) drug therapy: Secondary | ICD-10-CM

## 2013-06-13 DIAGNOSIS — M79609 Pain in unspecified limb: Secondary | ICD-10-CM

## 2013-06-13 DIAGNOSIS — I272 Pulmonary hypertension, unspecified: Secondary | ICD-10-CM | POA: Diagnosis present

## 2013-06-13 DIAGNOSIS — E119 Type 2 diabetes mellitus without complications: Secondary | ICD-10-CM

## 2013-06-13 DIAGNOSIS — I10 Essential (primary) hypertension: Secondary | ICD-10-CM | POA: Diagnosis present

## 2013-06-13 DIAGNOSIS — I359 Nonrheumatic aortic valve disorder, unspecified: Secondary | ICD-10-CM

## 2013-06-13 DIAGNOSIS — J9801 Acute bronchospasm: Secondary | ICD-10-CM

## 2013-06-13 DIAGNOSIS — N182 Chronic kidney disease, stage 2 (mild): Secondary | ICD-10-CM | POA: Diagnosis present

## 2013-06-13 DIAGNOSIS — I129 Hypertensive chronic kidney disease with stage 1 through stage 4 chronic kidney disease, or unspecified chronic kidney disease: Secondary | ICD-10-CM | POA: Diagnosis present

## 2013-06-13 LAB — CBC
HCT: 35.6 % — ABNORMAL LOW (ref 36.0–46.0)
Hemoglobin: 12.1 g/dL (ref 12.0–15.0)
MCV: 90.4 fL (ref 78.0–100.0)
RBC: 3.94 MIL/uL (ref 3.87–5.11)
WBC: 8.9 10*3/uL (ref 4.0–10.5)

## 2013-06-13 LAB — BASIC METABOLIC PANEL
BUN: 42 mg/dL — ABNORMAL HIGH (ref 6–23)
BUN: 44 mg/dL — ABNORMAL HIGH (ref 6–23)
CO2: 18 mEq/L — ABNORMAL LOW (ref 19–32)
CO2: 20 mEq/L (ref 19–32)
CO2: 22 mEq/L (ref 19–32)
Calcium: 8.4 mg/dL (ref 8.4–10.5)
Calcium: 8.5 mg/dL (ref 8.4–10.5)
Calcium: 8.6 mg/dL (ref 8.4–10.5)
Chloride: 98 mEq/L (ref 96–112)
Creatinine, Ser: 1.51 mg/dL — ABNORMAL HIGH (ref 0.50–1.10)
Creatinine, Ser: 1.59 mg/dL — ABNORMAL HIGH (ref 0.50–1.10)
Creatinine, Ser: 1.68 mg/dL — ABNORMAL HIGH (ref 0.50–1.10)
GFR calc Af Amer: 33 mL/min — ABNORMAL LOW (ref >90)
GFR calc Af Amer: 31 mL/min — ABNORMAL LOW (ref >90)
GFR calc non Af Amer: 29 mL/min — ABNORMAL LOW (ref >90)
GFR calc non Af Amer: 27 mL/min — ABNORMAL LOW (ref >90)
GFR calc non Af Amer: 25 mL/min — ABNORMAL LOW (ref >90)
Glucose, Bld: 405 mg/dL — ABNORMAL HIGH (ref 70–99)
Potassium: 4.1 mEq/L (ref 3.5–5.1)
Potassium: 4.2 mEq/L (ref 3.5–5.1)
Sodium: 129 mEq/L — ABNORMAL LOW (ref 135–145)
Sodium: 131 mEq/L — ABNORMAL LOW (ref 135–145)

## 2013-06-13 LAB — HEMOGLOBIN A1C
Hgb A1c MFr Bld: 9.2 % — ABNORMAL HIGH (ref <5.7)
Mean Plasma Glucose: 217 mg/dL — ABNORMAL HIGH (ref <117)

## 2013-06-13 LAB — CBC WITH DIFFERENTIAL/PLATELET
Basophils Relative: 1 % (ref 0–1)
HCT: 38.8 % (ref 36.0–46.0)
Hemoglobin: 13.2 g/dL (ref 12.0–15.0)
Lymphs Abs: 1.7 10*3/uL (ref 0.7–4.0)
MCH: 30.6 pg (ref 26.0–34.0)
MCHC: 34 g/dL (ref 30.0–36.0)
Monocytes Absolute: 0.5 10*3/uL (ref 0.1–1.0)
Monocytes Relative: 7 % (ref 3–12)
Neutro Abs: 4.9 10*3/uL (ref 1.7–7.7)
RBC: 4.31 MIL/uL (ref 3.87–5.11)

## 2013-06-13 LAB — URINALYSIS, ROUTINE W REFLEX MICROSCOPIC
Bilirubin Urine: NEGATIVE
Glucose, UA: 100 mg/dL — AB
Ketones, ur: NEGATIVE mg/dL
Specific Gravity, Urine: 1.016 (ref 1.005–1.030)
pH: 5.5 (ref 5.0–8.0)

## 2013-06-13 LAB — GLUCOSE, CAPILLARY
Glucose-Capillary: 576 mg/dL (ref 70–99)
Glucose-Capillary: 598 mg/dL (ref 70–99)
Glucose-Capillary: 506 mg/dL — ABNORMAL HIGH (ref 70–99)
Glucose-Capillary: 199 mg/dL — ABNORMAL HIGH (ref 70–99)

## 2013-06-13 LAB — URINE MICROSCOPIC-ADD ON

## 2013-06-13 LAB — CREATININE, SERUM
GFR calc Af Amer: 41 mL/min — ABNORMAL LOW (ref >90)
GFR calc non Af Amer: 36 mL/min — ABNORMAL LOW (ref >90)

## 2013-06-13 LAB — TSH: TSH: 1.877 u[IU]/mL (ref 0.350–4.500)

## 2013-06-13 LAB — COMPREHENSIVE METABOLIC PANEL
Albumin: 3.1 g/dL — ABNORMAL LOW (ref 3.5–5.2)
Alkaline Phosphatase: 91 U/L (ref 39–117)
BUN: 30 mg/dL — ABNORMAL HIGH (ref 6–23)
CO2: 24 mEq/L (ref 19–32)
Chloride: 101 mEq/L (ref 96–112)
Creatinine, Ser: 1.32 mg/dL — ABNORMAL HIGH (ref 0.50–1.10)
GFR calc Af Amer: 39 mL/min — ABNORMAL LOW (ref >90)
GFR calc non Af Amer: 34 mL/min — ABNORMAL LOW (ref >90)
Glucose, Bld: 213 mg/dL — ABNORMAL HIGH (ref 70–99)
Total Bilirubin: 0.3 mg/dL (ref 0.3–1.2)

## 2013-06-13 LAB — BLOOD GAS, ARTERIAL
Acid-base deficit: 3.3 mmol/L — ABNORMAL HIGH (ref 0.0–2.0)
Bicarbonate: 21 mEq/L (ref 20.0–24.0)
O2 Saturation: 95.2 %
Patient temperature: 97.6
TCO2: 18.7 mmol/L (ref 0–100)

## 2013-06-13 LAB — MRSA PCR SCREENING: MRSA by PCR: NEGATIVE

## 2013-06-13 LAB — PRO B NATRIURETIC PEPTIDE: Pro B Natriuretic peptide (BNP): 3376 pg/mL — ABNORMAL HIGH (ref 0–450)

## 2013-06-13 LAB — TROPONIN I: Troponin I: <0.3 ng/mL (ref <0.30)

## 2013-06-13 MED ORDER — SODIUM CHLORIDE 0.9 % IJ SOLN
3.0000 mL | Freq: Two times a day (BID) | INTRAMUSCULAR | Status: DC
  Administered 2013-06-15: 11:00:00 via INTRAVENOUS

## 2013-06-13 MED ORDER — METOPROLOL SUCCINATE ER 100 MG PO TB24
100.0000 mg | ORAL_TABLET | Freq: Every day | ORAL | Status: DC
  Administered 2013-06-13 – 2013-06-14 (×2): 100 mg via ORAL
  Filled 2013-06-13 (×3): qty 1

## 2013-06-13 MED ORDER — ONDANSETRON HCL 4 MG/2ML IJ SOLN: 4.0000 mg | Freq: Four times a day (QID) | INTRAMUSCULAR | Status: DC | PRN

## 2013-06-13 MED ORDER — INSULIN REGULAR BOLUS VIA INFUSION
0.0000 [IU] | Freq: Three times a day (TID) | INTRAVENOUS | Status: DC
  Filled 2013-06-13: qty 10

## 2013-06-13 MED ORDER — POTASSIUM CHLORIDE CRYS ER 20 MEQ PO TBCR
30.0000 meq | EXTENDED_RELEASE_TABLET | Freq: Once | ORAL | Status: AC
  Administered 2013-06-13: 18:00:00 30 meq via ORAL
  Filled 2013-06-13: qty 1

## 2013-06-13 MED ORDER — ACETAMINOPHEN 325 MG PO TABS
650.0000 mg | ORAL_TABLET | ORAL | Status: DC | PRN
  Administered 2013-06-13: 650 mg via ORAL
  Filled 2013-06-13: qty 2

## 2013-06-13 MED ORDER — ALBUTEROL SULFATE (5 MG/ML) 0.5% IN NEBU: 2.5000 mg | INHALATION_SOLUTION | RESPIRATORY_TRACT | Status: AC | PRN

## 2013-06-13 MED ORDER — HYDRALAZINE HCL 50 MG PO TABS
50.0000 mg | ORAL_TABLET | Freq: Three times a day (TID) | ORAL | Status: DC
  Administered 2013-06-13 – 2013-06-15 (×6): 50 mg via ORAL
  Filled 2013-06-13 (×8): qty 1

## 2013-06-13 MED ORDER — GLIMEPIRIDE 4 MG PO TABS
4.0000 mg | ORAL_TABLET | Freq: Every day | ORAL | Status: DC
  Administered 2013-06-13 – 2013-06-14 (×2): 4 mg via ORAL
  Filled 2013-06-13 (×4): qty 1

## 2013-06-13 MED ORDER — SODIUM CHLORIDE 0.9 % IJ SOLN: 3.0000 mL | INTRAMUSCULAR | Status: DC | PRN

## 2013-06-13 MED ORDER — PNEUMOCOCCAL VAC POLYVALENT 25 MCG/0.5ML IJ INJ
0.5000 mL | INJECTION | INTRAMUSCULAR | Status: AC
  Administered 2013-06-14: 0.5 mL via INTRAMUSCULAR
  Filled 2013-06-13 (×2): qty 0.5

## 2013-06-13 MED ORDER — SODIUM CHLORIDE 0.9 % IV SOLN
INTRAVENOUS | Status: DC
  Administered 2013-06-13: 5.4 [IU]/h via INTRAVENOUS
  Filled 2013-06-13: qty 1

## 2013-06-13 MED ORDER — INFLUENZA VAC SPLIT QUAD 0.5 ML IM SUSP
0.5000 mL | INTRAMUSCULAR | Status: AC
  Administered 2013-06-14: 0.5 mL via INTRAMUSCULAR
  Filled 2013-06-13 (×2): qty 0.5

## 2013-06-13 MED ORDER — FUROSEMIDE 10 MG/ML IJ SOLN
40.0000 mg | Freq: Once | INTRAMUSCULAR | Status: AC
  Administered 2013-06-13: 40 mg via INTRAVENOUS
  Filled 2013-06-13: qty 4

## 2013-06-13 MED ORDER — DEXTROSE 50 % IV SOLN: 25.0000 mL | INTRAVENOUS | Status: DC | PRN

## 2013-06-13 MED ORDER — NITROGLYCERIN IN D5W 200-5 MCG/ML-% IV SOLN
10.0000 ug/min | INTRAVENOUS | Status: DC
  Administered 2013-06-13: 10 ug/min via INTRAVENOUS
  Administered 2013-06-13: 125 ug/min via INTRAVENOUS
  Administered 2013-06-13: 170 ug/min via INTRAVENOUS
  Filled 2013-06-13 (×3): qty 250

## 2013-06-13 MED ORDER — INSULIN ASPART 100 UNIT/ML ~~LOC~~ SOLN: 0.0000 [IU] | Freq: Three times a day (TID) | SUBCUTANEOUS | Status: DC

## 2013-06-13 MED ORDER — ASPIRIN EC 81 MG PO TBEC
81.0000 mg | DELAYED_RELEASE_TABLET | Freq: Every day | ORAL | Status: DC
  Administered 2013-06-13 – 2013-06-15 (×3): 81 mg via ORAL
  Filled 2013-06-13 (×3): qty 1

## 2013-06-13 MED ORDER — FUROSEMIDE 10 MG/ML IJ SOLN
40.0000 mg | Freq: Two times a day (BID) | INTRAMUSCULAR | Status: AC
  Administered 2013-06-13 – 2013-06-14 (×2): 40 mg via INTRAVENOUS
  Filled 2013-06-13 (×2): qty 4

## 2013-06-13 MED ORDER — INSULIN GLARGINE 100 UNIT/ML ~~LOC~~ SOLN
15.0000 [IU] | Freq: Every day | SUBCUTANEOUS | Status: DC
  Filled 2013-06-13: qty 0.15

## 2013-06-13 MED ORDER — ALBUTEROL (5 MG/ML) CONTINUOUS INHALATION SOLN
INHALATION_SOLUTION | RESPIRATORY_TRACT | Status: AC
  Filled 2013-06-13: qty 20

## 2013-06-13 MED ORDER — ALBUTEROL (5 MG/ML) CONTINUOUS INHALATION SOLN
10.0000 mg/h | INHALATION_SOLUTION | RESPIRATORY_TRACT | Status: DC
  Administered 2013-06-13: 10 mg/h via RESPIRATORY_TRACT

## 2013-06-13 MED ORDER — CYCLOBENZAPRINE HCL 5 MG PO TABS
5.0000 mg | ORAL_TABLET | Freq: Once | ORAL | Status: AC
  Administered 2013-06-13: 5 mg via ORAL
  Filled 2013-06-13 (×2): qty 1

## 2013-06-13 MED ORDER — HYDRALAZINE HCL 25 MG PO TABS
25.0000 mg | ORAL_TABLET | Freq: Three times a day (TID) | ORAL | Status: DC
  Administered 2013-06-13 (×2): 25 mg via ORAL
  Filled 2013-06-13 (×4): qty 1

## 2013-06-13 MED ORDER — LABETALOL HCL 5 MG/ML IV SOLN
10.0000 mg | Freq: Once | INTRAVENOUS | Status: AC
  Administered 2013-06-13: 10 mg via INTRAVENOUS
  Filled 2013-06-13: qty 4

## 2013-06-13 MED ORDER — SODIUM CHLORIDE 0.9 % IV SOLN
250.0000 mL | INTRAVENOUS | Status: DC | PRN
  Administered 2013-06-13: 10 mL via INTRAVENOUS

## 2013-06-13 MED ORDER — METHYLPREDNISOLONE SODIUM SUCC 125 MG IJ SOLR
125.0000 mg | Freq: Once | INTRAMUSCULAR | Status: AC
  Administered 2013-06-13: 125 mg via INTRAVENOUS
  Filled 2013-06-13: qty 2

## 2013-06-13 MED ORDER — SODIUM CHLORIDE 0.9 % IV SOLN
INTRAVENOUS | Status: DC
  Administered 2013-06-13 – 2013-06-14 (×2): via INTRAVENOUS

## 2013-06-13 MED ORDER — DEXTROSE-NACL 5-0.45 % IV SOLN
INTRAVENOUS | Status: DC
  Administered 2013-06-13: 50 mL via INTRAVENOUS

## 2013-06-13 MED ORDER — ENOXAPARIN SODIUM 30 MG/0.3ML ~~LOC~~ SOLN
30.0000 mg | SUBCUTANEOUS | Status: DC
  Administered 2013-06-13 – 2013-06-15 (×3): 30 mg via SUBCUTANEOUS
  Filled 2013-06-13 (×3): qty 0.3

## 2013-06-13 MED ORDER — NITROGLYCERIN IN D5W 200-5 MCG/ML-% IV SOLN: 10.0000 ug/min | INTRAVENOUS | Status: DC

## 2013-06-13 NOTE — ED Notes (Signed)
Pt family member states pt began having difficulty breathing a couple of hours ago. Pt denies being sick or having any previous difficulty breathing.

## 2013-06-13 NOTE — Progress Notes (Signed)
VASCULAR LAB PRELIMINARY  PRELIMINARY  PRELIMINARY  PRELIMINARY  Right lower extremity venous duplex completed.    Preliminary report:  Right:  No evidence of DVT, superficial thrombosis, or Baker's cyst.  Adarrius Graeff, RVT 06/13/2013, 11:21 AM

## 2013-06-13 NOTE — Progress Notes (Signed)
Inpatient Diabetes Program Recommendations  AACE/ADA: New Consensus Statement on Inpatient Glycemic Control (2013)  Target Ranges:  Prepandial:   less than 140 mg/dL      Peak postprandial:   less than 180 mg/dL (1-2 hours)      Critically ill patients:  140 - 180 mg/dL   Reason for Visit: Hyperglycemia   Results for Cynthia Todd, Cynthia Todd (MRN [id number]) as of 06/13/2013 15:46  Ref. Range 10/01/2011 20:41 10/02/2011 06:07 10/02/2011 06:49 10/02/2011 11:36 06/13/2013 12:17  Glucose-Capillary Latest Range: 70-99 mg/dL 308 (H) 70 657 (H) 846 (H) 61 (HH)    79 year old female admitted with SOB, Hx IDDM and HTN Medical records indicate pt on Lantus 85 units QHS and Amaryl 4 mg QD.  GlucoStabilizer began this afternoon for blood sugars of 296 and 576.  Will follow while inpatient . Thank you. Ailene Ards, RD, LDN, CDE Inpatient Diabetes Coordinator [phone]

## 2013-06-13 NOTE — ED Notes (Signed)
Pt Nitro drip titrated to per MD

## 2013-06-13 NOTE — H&P (Signed)
Chief Complaint:  sob  HPI: 79 yo female with h/o hth, diastolic chf, RAD had sudden onset of sob tonight that awoke her.  No swelling.  No fevers.  No cough.  Her son takes her bp every 3 days on a regular basis, she does not know these numbers, her grandson who is present does not what her bp usually is.  On arrival she was hypoxic, dyspneic and sbp was over 240.  On arrival also was wheezing and had crackles per EDP.  Was given albuterol, solumedrol, lasix, oxygen, and ntg gtt.  Pt and her grandson say she looks much better and she says her breathing is much better now.  No n/v.  No chest pain.  She does not require oxygen at home.  She is currently able to speak in full sentences, in somalian.  Her oxgyen sats remained above 95%, after i turned her down from 5 L to 2 L supplementation.    Review of Systems:  Positive and negative as per HPI otherwise all other systems are negative  Past Medical History: Past Medical History  Diagnosis Date  . Diabetes mellitus   . Hypertension    History reviewed. No pertinent past surgical history.  Medications: Prior to Admission medications   Medication Sig Start Date End Date Taking? Authorizing Provider  cetirizine (ZYRTEC) 10 MG tablet Take 10 mg by mouth daily as needed for allergies.   Yes Historical Provider, MD  glimepiride (AMARYL) 4 MG tablet Take 4 mg by mouth daily before breakfast.   Yes Historical Provider, MD  metoprolol succinate (TOPROL-XL) 50 MG 24 hr tablet Take 50 mg by mouth every morning. Take with or immediately following a meal.   Yes Historical Provider, MD    Allergies:  No Known Allergies  Social History:  reports that she has never smoked. She does not have any smokeless tobacco history on file. She reports that she does not drink alcohol. Her drug history is not on file.  Family History: History reviewed. No pertinent family history.  Physical Exam: Filed Vitals:   06/13/13 0209 06/13/13 0210 06/13/13 0223  06/13/13 0343  BP:  264/90    Pulse:  73  94  Temp:  97.7 F (36.5 C)    TempSrc:  Oral    Resp:  22  21  SpO2: 81% 99% 97% 100%   General appearance: alert, cooperative and no distress Head: Normocephalic, without obvious abnormality, atraumatic Eyes: negative Nose: Nares normal. Septum midline. Mucosa normal. No drainage or sinus tenderness. Neck: no JVD and supple, symmetrical, trachea midline Lungs: crackles at bases no w/r/r good air movement Heart: regular rate and rhythm, S1, S2 normal, no murmur, click, rub or gallop Abdomen: soft, non-tender; bowel sounds normal; no masses,  no organomegaly Extremities: extremities normal, atraumatic, no cyanosis or edema Pulses: 2+ and symmetric Skin: Skin color, texture, turgor normal. No rashes or lesions Neurologic: Grossly normal    Labs on Admission:   Recent Labs  06/13/13 0225  NA 133*  K 3.7  CL 101  CO2 24  GLUCOSE 213*  BUN 30*  CREATININE 1.32*  CALCIUM 8.7    Recent Labs  06/13/13 0225  AST 54*  ALT 65*  ALKPHOS 91  BILITOT 0.3  PROT 7.7  ALBUMIN 3.1*    Recent Labs  06/13/13 0225  WBC 7.5  NEUTROABS 4.9  HGB 13.2  HCT 38.8  MCV 90.0  PLT 203    Recent Labs  06/13/13 0225  TROPONINI <0.30  Radiological Exams on Admission: Dg Chest Port 1 View  06/13/2013   CLINICAL DATA:  Shortness of breath  EXAM: PORTABLE CHEST - 1 VIEW  COMPARISON:  09/27/2011  FINDINGS: No cardiomegaly. Unchanged mediastinal contours. Diffuse interstitial opacities, likely with small pleural effusions. Pulmonary blood flow is mildly cephalized. No asymmetric opacity.  IMPRESSION: Pulmonary edema with probable small pleural effusions.   Electronically Signed   By: Tiburcio Pea M.D.   On: 06/13/2013 02:35    Assessment/Plan  79 yo female with prob flash pulm edema, acute on chronic chf, malignent htn  Principal Problem:   Acute on chronic diastolic CHF (congestive heart failure) likely due to malignent htn place  on chf pathway, repeat echo, last echo done in jan 2013.  Lasix.  Serial cardiac enzymes.  ntg gtt.  Increase toprol.  Asa.  Place in stepdown.    Active Problems:   Shortness of breath- may also have a component of reactive airway disease -place on prn nebs, hold off on further steroids at this time   Malignant hypertension   Diastolic heart failure, NYHA class 1   Pulmonary HTN    Fannye Myer A 06/13/2013, 4:44 AM

## 2013-06-13 NOTE — Plan of Care (Signed)
Problem: Phase I Progression Outcomes Goal: Pain controlled with appropriate interventions Outcome: Not Applicable Date Met:  06/13/13 Pt denied pain

## 2013-06-13 NOTE — Progress Notes (Signed)
Echocardiogram 2D Echocardiogram has been performed.  Dorothey Baseman 06/13/2013, 3:53 PM

## 2013-06-13 NOTE — Progress Notes (Signed)
Patient seen and examined this morning. Agree with H&P per Dr. Onalee Hua.  Chart review indicated that in January 2013 when she was admitted she was sent home on Hydralazine 25 TID, Lasix and Nevibolol. She was also on Synthroid. Now her only BP medication is Metoprolol and she is not on Synthroid anymore. Patient and family unable to tell me anything about that. - restart hydralazine, check TSH  Principal Problem:  Acute on chronic diastolic CHF Active Problems:  Shortness of breath Malignant hypertension  Diastolic heart failure, NYHA class 1  Pulmonary HTN   Cynthia Tuohy M. Elvera Lennox, MD [facility] [phone]

## 2013-06-13 NOTE — Plan of Care (Signed)
Problem: Phase I Progression Outcomes Goal: Dyspnea controlled at rest (HF) Outcome: Completed/Met Date Met:  06/13/13 Pt titrated from 5L O2 to 3L

## 2013-06-13 NOTE — Progress Notes (Signed)
CARE MANAGEMENT NOTE 06/13/2013  Patient:  Cynthia Todd, Cynthia Todd   Account Number:  [id number]  Date Initiated:  06/13/2013  Documentation initiated by:  DAVIS,RHONDA  Subjective/Objective Assessment:   patient with dyspnea and htn crisis on iv ntg drip     Action/Plan:   home when stable   Anticipated DC Date:  06/16/2013   Anticipated DC Plan:  HOME/SELF CARE  In-house referral  Interpreting Services      DC Planning Services  CM consult      [facility] Choice  NA   Choice offered to / List presented to:  C-4 Adult Children   DME arranged  NA      DME agency  NA     HH arranged  NA      HH agency  NA   Status of service:  In process, will continue to follow Medicare Important Message given?  NA - LOS <3 / Initial given by admissions (If response is "NO", the following Medicare IM given date fields will be blank) Date Medicare IM given:   Date Additional Medicare IM given:    Discharge Disposition:    Per UR Regulation:  Reviewed for med. necessity/level of care/duration of stay  If discussed at Long Length of Stay Meetings, dates discussed:    Comments:  [id number]/Rhonda Stark Jock, BSN, [place] [phone] Chart Reviewed for discharge and hospital needs. Discharge needs at time of review:  request for home heart failure program refused by patient and family.  she does not want any outsiders in her home.  This information and describtion of hhc functions was explained to all parties via the interpreting service on speaker phone. Review of patient progress due on [id number].

## 2013-06-13 NOTE — ED Provider Notes (Signed)
CSN: [id number]     Arrival date & time 06/13/13  0201 History   First MD Initiated Contact with Patient 06/13/13 0204     Chief Complaint  Patient presents with  . Shortness of Breath   (Consider location/radiation/quality/duration/timing/severity/associated sxs/prior Treatment) HPI Patient presents with progressive starting after 11 PM this evening. The patient has had audible wheezing and increased work of breathing. Patient denies having any chest pain abdominal pain or lower extremity swelling. She's had no increasing cough and has not been having fevers. Patient was noted to have saturations in the 70s which improved to high 90s on 5 L of oxygen.  Past Medical History  Diagnosis Date  . Diabetes mellitus   . Hypertension    History reviewed. No pertinent past surgical history. History reviewed. No pertinent family history. History  Substance Use Topics  . Smoking status: Never Smoker   . Smokeless tobacco: Not on file  . Alcohol Use: No   OB History   Grav Para Term Preterm Abortions TAB SAB Ect Mult Living                 Review of Systems  Constitutional: Negative for fever and chills.  Respiratory: Positive for shortness of breath and wheezing. Negative for cough.   Cardiovascular: Negative for chest pain, palpitations and leg swelling.  Gastrointestinal: Negative for nausea, vomiting and abdominal pain.  Musculoskeletal: Negative for back pain.  Skin: Negative for rash and wound.  Neurological: Negative for dizziness, weakness, light-headedness and numbness.  All other systems reviewed and are negative.    Allergies  Review of patient's allergies indicates no known allergies.  Home Medications   Current Outpatient Rx  Name  Route  Sig  Dispense  Refill  . acetaminophen (TYLENOL) 500 MG tablet   Oral   Take 500 mg by mouth at bedtime as needed. For pain/headache         . EXPIRED: albuterol (PROVENTIL,VENTOLIN) 90 MCG/ACT inhaler   Inhalation   Inhale  2 puffs into the lungs every 6 (six) hours as needed for wheezing.   17 g   12   . EXPIRED: furosemide (LASIX) 20 MG tablet   Oral   Take 1 tablet (20 mg total) by mouth daily.   30 tablet   0   . glimepiride (AMARYL) 4 MG tablet   Oral   Take 4 mg by mouth daily before breakfast.         . EXPIRED: hydrALAZINE (APRESOLINE) 25 MG tablet   Oral   Take 1 tablet (25 mg total) by mouth 3 (three) times daily.   30 tablet   0   . insulin glargine (LANTUS) 100 UNIT/ML injection   Subcutaneous   Inject 85 Units into the skin at bedtime.   10 mL   0   . levothyroxine (SYNTHROID, LEVOTHROID) 50 MCG tablet   Oral   Take 50 mcg by mouth daily.         . naphazoline (CLEAR EYES) 0.012 % ophthalmic solution   Both Eyes   Place 2 drops into both eyes 4 (four) times daily as needed. For irritation         . nebivolol (BYSTOLIC) 5 MG tablet   Oral   Take 5 mg by mouth daily.          BP 264/90  Pulse 73  Temp(Src) 97.7 F (36.5 C) (Oral)  Resp 22  SpO2 97% Physical Exam  Nursing note and vitals reviewed. Constitutional:  She is oriented to person, place, and time. She appears well-developed and well-nourished.  HENT:  Head: Normocephalic and atraumatic.  Mouth/Throat: Oropharynx is clear and moist.  Eyes: EOM are normal. Pupils are equal, round, and reactive to light.  Neck: Normal range of motion. Neck supple.  Cardiovascular: Normal rate and regular rhythm.   Pulmonary/Chest: No respiratory distress. She has wheezes. She has no rales.  Tachypnea with increased work of breathing. Audible expiratory wheezes throughout.  Abdominal: Soft. Bowel sounds are normal. She exhibits no distension and no mass. There is no tenderness. There is no rebound and no guarding.  Musculoskeletal: Normal range of motion. She exhibits no edema and no tenderness.  swelling or tenderness. No palpable cords.  Neurological: She is alert and oriented to person, place, and time.  Moves all  extremities without deficit. Sensation grossly intact.  Skin: Skin is warm and dry. No rash noted. No erythema.  Psychiatric: She has a normal mood and affect. Her behavior is normal.    ED Course  Procedures (including critical care time) Labs Review Labs Reviewed  CBC WITH DIFFERENTIAL  COMPREHENSIVE METABOLIC PANEL  PRO B NATRIURETIC PEPTIDE  TROPONIN I   Imaging Review No results found.  EKG Interpretation   None      Date: 06/13/2013  Rate: 76  Rhythm: normal sinus rhythm  QRS Axis: normal  Intervals: normal  ST/T Wave abnormalities: normal  Conduction Disutrbances:none  Narrative Interpretation:   Old EKG Reviewed: unchanged Occasional PACs  MDM  Patient's breathing has improved. Crackles were more pronounced but less wheezing. Patient is breathing much more comfortably. We'll continue to titrate nitroglycerin to lower blood pressure. Discussed with the hospitalist. We'll admit to step down bed.    Loren Racer, MD 06/13/13 [phone]

## 2013-06-14 LAB — BASIC METABOLIC PANEL
CO2: 21 mEq/L (ref 19–32)
Calcium: 9 mg/dL (ref 8.4–10.5)
Calcium: 9.2 mg/dL (ref 8.4–10.5)
Creatinine, Ser: 1.5 mg/dL — ABNORMAL HIGH (ref 0.50–1.10)
Creatinine, Ser: 1.7 mg/dL — ABNORMAL HIGH (ref 0.50–1.10)
GFR calc Af Amer: 29 mL/min — ABNORMAL LOW (ref >90)
GFR calc non Af Amer: 25 mL/min — ABNORMAL LOW (ref >90)
GFR calc non Af Amer: 29 mL/min — ABNORMAL LOW (ref >90)
Glucose, Bld: 109 mg/dL — ABNORMAL HIGH (ref 70–99)
Glucose, Bld: 174 mg/dL — ABNORMAL HIGH (ref 70–99)
Potassium: 4.2 mEq/L (ref 3.5–5.1)
Potassium: 4.5 mEq/L (ref 3.5–5.1)
Sodium: 133 mEq/L — ABNORMAL LOW (ref 135–145)
Sodium: 134 mEq/L — ABNORMAL LOW (ref 135–145)

## 2013-06-14 LAB — GLUCOSE, CAPILLARY
Glucose-Capillary: 168 mg/dL — ABNORMAL HIGH (ref 70–99)
Glucose-Capillary: 133 mg/dL — ABNORMAL HIGH (ref 70–99)
Glucose-Capillary: 307 mg/dL — ABNORMAL HIGH (ref 70–99)

## 2013-06-14 MED ORDER — VITAMINS A & D EX OINT
TOPICAL_OINTMENT | CUTANEOUS | Status: AC
  Filled 2013-06-14: qty 5

## 2013-06-14 MED ORDER — INSULIN GLARGINE 100 UNIT/ML ~~LOC~~ SOLN
25.0000 [IU] | Freq: Two times a day (BID) | SUBCUTANEOUS | Status: DC
  Administered 2013-06-14 – 2013-06-15 (×2): 25 [IU] via SUBCUTANEOUS
  Filled 2013-06-14 (×3): qty 0.25

## 2013-06-14 MED ORDER — INSULIN ASPART 100 UNIT/ML ~~LOC~~ SOLN
0.0000 [IU] | Freq: Three times a day (TID) | SUBCUTANEOUS | Status: DC
  Administered 2013-06-14: 3 [IU] via SUBCUTANEOUS
  Administered 2013-06-14: 8 [IU] via SUBCUTANEOUS
  Administered 2013-06-14: 11 [IU] via SUBCUTANEOUS
  Administered 2013-06-15: 5 [IU] via SUBCUTANEOUS
  Administered 2013-06-15: 2 [IU] via SUBCUTANEOUS

## 2013-06-14 MED ORDER — DEXTROSE 50 % IV SOLN: 25.0000 mL | Freq: Once | INTRAVENOUS | Status: AC | PRN

## 2013-06-14 MED ORDER — DEXTROSE 50 % IV SOLN: 50.0000 mL | Freq: Once | INTRAVENOUS | Status: AC | PRN

## 2013-06-14 MED ORDER — INSULIN GLARGINE 100 UNIT/ML ~~LOC~~ SOLN
25.0000 [IU] | Freq: Every day | SUBCUTANEOUS | Status: DC
  Administered 2013-06-14: 25 [IU] via SUBCUTANEOUS
  Filled 2013-06-14 (×2): qty 0.25

## 2013-06-14 MED ORDER — FUROSEMIDE 40 MG PO TABS
40.0000 mg | ORAL_TABLET | Freq: Every day | ORAL | Status: DC
  Administered 2013-06-15: 40 mg via ORAL
  Filled 2013-06-14: qty 1

## 2013-06-14 MED ORDER — CLONIDINE HCL 0.1 MG PO TABS
0.1000 mg | ORAL_TABLET | ORAL | Status: AC
  Administered 2013-06-14: 0.1 mg via ORAL
  Filled 2013-06-14: qty 1

## 2013-06-14 MED ORDER — GLUCOSE 40 % PO GEL: 1.0000 | ORAL | Status: DC | PRN

## 2013-06-14 NOTE — Progress Notes (Signed)
[facility] PROGRESS NOTE  ASHA GRUMBINE ZHY:[id number] DOB: 05-04-1919 DOA: 06/13/2013 PCP: No primary provider on file.  HPI: 79 yo female with h/o hth, diastolic chf, RAD had sudden onset of sob tonight that awoke her. No swelling. No fevers. No cough. Her son takes her bp every 3 days on a regular basis, she does not know these numbers, her grandson who is present does not what her bp usually is. On arrival she was hypoxic, dyspneic and sbp was over 240. On arrival also was wheezing and had crackles per EDP. Was given albuterol, solumedrol, lasix, oxygen, and ntg gtt. Pt and her grandson say she looks much better and she says her breathing is much better now. No n/v. No chest pain. She does not require oxygen at home. She is currently able to speak in full sentences, in somalian. Her oxgyen sats remained above 95%, after i turned her down from 5 L to 2 L supplementation.   Assessment/Plan: Malignant hypertension - patient on nitro gtt, finally coming off this morning.  - she used to be on Hydralazine 25 TID, Bystolic and Lasix. - started hydralazine, she is on Metop. IV Lasix, transition to po tomorrow.  Acute on chronic diastolic CHF  - repeat TTE as below.  Shortness of breath - due to #1 and #2, resolved DM - on Lantus 30-35 U BID at home. Previously not noted on admission or on her medication list; language barrier present as well and patient does not know her medications. Her sugars went to 600 after a meal thus needed insulin drip for few hours.    Diet: carb Fluids: None DVT Prophylaxis: Lovenox  Code Status: Full Family Communication: extended family in the room  Disposition Plan: inpatient  Consultants:  none  Procedures:  2D echo Study Conclusions  - Left ventricle: Wall thickness was increased in a pattern of mild LVH. Systolic function was vigorous. The estimated ejection fraction was in the range of 65% to 70%. Wall motion was normal; there were no regional  wall motion abnormalities. Features are consistent with a pseudonormal left ventricular filling pattern, with concomitant abnormal relaxation and increased filling pressure (grade 2 diastolic dysfunction). Doppler parameters are consistent with high ventricular filling pressure. - Aortic valve: Mild regurgitation. - Mitral valve: Mild regurgitation.   Antibiotics None  HPI/Subjective: - no complaints, feeling well.   Objective: Filed Vitals:   06/14/13 0530 06/14/13 0600 06/14/13 0630 06/14/13 0637  BP: 136/29 149/46 158/43 158/43  Pulse:   57   Temp:      TempSrc:      Resp: 22 18 24    Height:      Weight:      SpO2: 98% 98% 98%     Intake/Output Summary (Last 24 hours) at 06/14/13 0840 Last data filed at 06/14/13 0600  Gross per 24 hour  Intake 1847.72 ml  Output   2650 ml  Net -802.28 ml   Filed Weights   06/13/13 0526 06/14/13 0500  Weight: 63.6 kg (140 lb 3.4 oz) 64.2 kg (141 lb 8.6 oz)    Exam:   General:  NAD  Cardiovascular: regular rate and rhythm, without MRG  Respiratory: good air movement, clear to auscultation throughout, no wheezing, ronchi or rales  Abdomen: soft, not tender to palpation, positive bowel sounds  MSK: no peripheral edema  Neuro: non focal  Data Reviewed: Basic Metabolic Panel:  Recent Labs Lab 06/13/13 1758 06/13/13 1952 06/13/13 2215 06/14/13 0005 06/14/13 0330  NA 129*  133* 131* 133* 134*  K 4.1 4.1 4.2 4.2 4.5  CL 98 101 99 100 96  CO2 18* 20 22 22 21   GLUCOSE 405* 192* 159* 109* 174*  BUN 42* 44* 46* 48* 46*  CREATININE 1.51* 1.59* 1.68* 1.70* 1.50*  CALCIUM 8.4 8.5 8.6 9.2 9.0   Liver Function Tests:  Recent Labs Lab 06/13/13 0225  AST 54*  ALT 65*  ALKPHOS 91  BILITOT 0.3  PROT 7.7  ALBUMIN 3.1*   CBC:  Recent Labs Lab 06/13/13 0225 06/13/13 0650  WBC 7.5 8.9  NEUTROABS 4.9  --   HGB 13.2 12.1  HCT 38.8 35.6*  MCV 90.0 90.4  PLT 203 183   Cardiac Enzymes:  Recent Labs Lab  06/13/13 0225 06/13/13 0650 06/13/13 1205  TROPONINI <0.30 <0.30 <0.30   BNP (last 3 results)  Recent Labs  06/13/13 0225  PROBNP 3376.0*   CBG:  Recent Labs Lab 06/13/13 2240 06/13/13 2344 06/14/13 0048 06/14/13 0143 06/14/13 0755  GLUCAP 142* 104* 101* 133* 185*    Recent Results (from the past 240 hour(s))  MRSA PCR SCREENING     Status: None   Collection Time    06/13/13  5:25 AM      Result Value Range Status   MRSA by PCR NEGATIVE  NEGATIVE Final   Comment:            The GeneXpert MRSA Assay (FDA     approved for NASAL specimens     only), is one component of a     comprehensive MRSA colonization     surveillance program. It is not     intended to diagnose MRSA     infection nor to guide or     monitor treatment for     MRSA infections.     Studies: Dg Chest Port 1 View  06/13/2013   CLINICAL DATA:  Shortness of breath  EXAM: PORTABLE CHEST - 1 VIEW  COMPARISON:  09/27/2011  FINDINGS: No cardiomegaly. Unchanged mediastinal contours. Diffuse interstitial opacities, likely with small pleural effusions. Pulmonary blood flow is mildly cephalized. No asymmetric opacity.  IMPRESSION: Pulmonary edema with probable small pleural effusions.   Electronically Signed   By: Tiburcio Pea M.D.   On: 06/13/2013 02:35    Scheduled Meds: . aspirin EC  81 mg Oral Daily  . enoxaparin (LOVENOX) injection  30 mg Subcutaneous Q24H  . glimepiride  4 mg Oral QAC breakfast  . hydrALAZINE  50 mg Oral Q8H  . influenza vac split quadrivalent PF  0.5 mL Intramuscular Tomorrow-1000  . insulin aspart  0-15 Units Subcutaneous TID WC  . insulin glargine  25 Units Subcutaneous QHS  . metoprolol succinate  100 mg Oral Daily  . pneumococcal 23 valent vaccine  0.5 mL Intramuscular Tomorrow-1000  . sodium chloride  3 mL Intravenous Q12H   Continuous Infusions: . sodium chloride 50 mL (06/14/13 0145)  . insulin (NOVOLIN-R) infusion Stopped (06/14/13 0200)  . nitroGLYCERIN 20 mcg/min  (06/14/13 0600)    Principal Problem:   Acute on chronic diastolic CHF (congestive heart failure) Active Problems:   Shortness of breath   Malignant hypertension   Diastolic heart failure, NYHA class 1   Pulmonary HTN   Time spent: 35  Pamella Pert, MD [facility] Pager [phone]. If 7 PM - 7 AM, please contact night-coverage at [email or web address], password [facility] 06/14/2013, 8:40 AM  LOS: 1 day

## 2013-06-15 DIAGNOSIS — J9600 Acute respiratory failure, unspecified whether with hypoxia or hypercapnia: Secondary | ICD-10-CM

## 2013-06-15 DIAGNOSIS — E1121 Type 2 diabetes mellitus with diabetic nephropathy: Secondary | ICD-10-CM | POA: Diagnosis present

## 2013-06-15 LAB — GLUCOSE, CAPILLARY
Glucose-Capillary: 57 mg/dL — ABNORMAL LOW (ref 70–99)
Glucose-Capillary: 79 mg/dL (ref 70–99)
Glucose-Capillary: 175 mg/dL — ABNORMAL HIGH (ref 70–99)
Glucose-Capillary: 127 mg/dL — ABNORMAL HIGH (ref 70–99)

## 2013-06-15 LAB — BASIC METABOLIC PANEL
BUN: 53 mg/dL — ABNORMAL HIGH (ref 6–23)
Chloride: 101 mEq/L (ref 96–112)
Creatinine, Ser: 1.59 mg/dL — ABNORMAL HIGH (ref 0.50–1.10)
GFR calc non Af Amer: 27 mL/min — ABNORMAL LOW (ref >90)
Glucose, Bld: 98 mg/dL (ref 70–99)
Potassium: 3.9 mEq/L (ref 3.5–5.1)
Sodium: 133 mEq/L — ABNORMAL LOW (ref 135–145)

## 2013-06-15 LAB — CBC
HCT: 32.6 % — ABNORMAL LOW (ref 36.0–46.0)
Hemoglobin: 11.1 g/dL — ABNORMAL LOW (ref 12.0–15.0)
MCH: 30.7 pg (ref 26.0–34.0)
MCHC: 34 g/dL (ref 30.0–36.0)
MCV: 90.3 fL (ref 78.0–100.0)
Platelets: 174 10*3/uL (ref 150–400)
RDW: 13.7 % (ref 11.5–15.5)

## 2013-06-15 MED ORDER — HYDRALAZINE HCL 50 MG PO TABS: 50.0000 mg | ORAL_TABLET | Freq: Two times a day (BID) | ORAL | Status: DC

## 2013-06-15 MED ORDER — METOPROLOL TARTRATE 50 MG PO TABS: 50.0000 mg | ORAL_TABLET | Freq: Two times a day (BID) | ORAL | Status: DC

## 2013-06-15 MED ORDER — INSULIN GLARGINE 100 UNIT/ML ~~LOC~~ SOLN: 25.0000 [IU] | Freq: Every day | SUBCUTANEOUS | Status: DC

## 2013-06-15 MED ORDER — ISOSORBIDE MONONITRATE ER 30 MG PO TB24: 30.0000 mg | ORAL_TABLET | Freq: Every day | ORAL | Status: DC

## 2013-06-15 MED ORDER — DEXTROSE 50 % IV SOLN: 25.0000 mL | Freq: Once | INTRAVENOUS | Status: DC | PRN

## 2013-06-15 MED ORDER — ISOSORB DINITRATE-HYDRALAZINE 20-37.5 MG PO TABS: 1.0000 | ORAL_TABLET | Freq: Two times a day (BID) | ORAL | Status: DC

## 2013-06-15 MED ORDER — FUROSEMIDE 40 MG PO TABS: 40.0000 mg | ORAL_TABLET | Freq: Every day | ORAL | Status: DC

## 2013-06-15 MED ORDER — GLUCOSE 40 % PO GEL: 1.0000 | ORAL | Status: DC | PRN

## 2013-06-15 MED ORDER — ISOSORB DINITRATE-HYDRALAZINE 20-37.5 MG PO TABS
1.0000 | ORAL_TABLET | Freq: Two times a day (BID) | ORAL | Status: DC
  Administered 2013-06-15: 1 via ORAL
  Filled 2013-06-15 (×2): qty 1

## 2013-06-15 MED ORDER — METOPROLOL SUCCINATE ER 100 MG PO TB24
100.0000 mg | ORAL_TABLET | Freq: Every day | ORAL | Status: DC
  Filled 2013-06-15: qty 1

## 2013-06-15 MED ORDER — GLIMEPIRIDE 2 MG PO TABS
2.0000 mg | ORAL_TABLET | Freq: Every day | ORAL | Status: DC
Start: 2013-06-15 — End: 2013-06-15
  Administered 2013-06-15: 2 mg via ORAL
  Filled 2013-06-15 (×2): qty 1

## 2013-06-15 MED ORDER — INSULIN GLARGINE 100 UNIT/ML ~~LOC~~ SOLN
18.0000 [IU] | Freq: Two times a day (BID) | SUBCUTANEOUS | Status: DC
  Filled 2013-06-15: qty 0.18

## 2013-06-15 NOTE — Discharge Summary (Signed)
Physician Discharge Summary  Cynthia Todd ZOX:[id number] DOB: 10/21/1918 DOA: 06/13/2013  PCP: No primary provider on file.  Admit date: 06/13/2013 Discharge date: 06/15/2013  Time spent: 45 minutes  Recommendations for Outpatient Follow-up:  1. Needs outpatient followup with primary care physician and management  diabetes and hypertension  2. Is to followup with cardiology for heart failure 3. Needs basic metabolic panel and CBC up in about a week  Discharge Diagnoses:  Principal Problem:   Acute respiratory failure Active Problems:   Malignant hypertension   Pulmonary HTN   Acute on chronic diastolic CHF (congestive heart failure)   Insulin dependent diabetes mellitus   Diabetic nephropathy   Discharge Condition: Stable  Diet recommendation: Low-salt heart healthy  Filed Weights   06/13/13 0526 06/14/13 0500 06/15/13 0400  Weight: 63.6 kg (140 lb 3.4 oz) 64.2 kg (141 lb 8.6 oz) 65.8 kg (145 lb 1 oz)    History of present illness:  79 yo female with h/o hth, diastolic chf, RAD had sudden onset of sob tonight that awoke her. No swelling. No fevers. No cough. Her son takes her bp every 3 days on a regular basis, she does not know these numbers, her grandson who is present does not what her bp usually is. On arrival she was hypoxic, dyspneic and sbp was over 240. On arrival also was wheezing and had crackles per EDP. Was given albuterol, solumedrol, lasix, oxygen, and ntg gtt.  She was admitted and it was felt that showed multifactorial respiratory failure which improved subsequent to aggressive diuresis   Hospital Course:  Multi factorial respiratory failure-secondary to CHF. See below Malignant hypertension - patient on nitro gtt, blood pressure medications were adjusted during hospital stay - she used to be on Hydralazine 25 TID, Bystolic and Lasix.  - Discharge changed to hydralazine twice a day 50 mg, Imdur 30 mg added, metoprolol changed to 50 twice a day to help with  overall control Acute on chronic diastolic CHF  - repeat TTE as below. EF 65-70% with grade 2 diastolic dysfunction and mild regurgitation Was euvolemic but will need Lasix chronically until seen by cardiology and have set up an outpatient appointment Shortness of breath - due to #1 and #2, resolved  DM evidence of diabetic nephropathy - on Lantus 30-35 U BID at home. Diabetes with high blood sugars necessitating being on insulin drip and then low blood sugar subsequently. She was transitioned from above dose to 25 units once daily for ease of administration this will need to monitor closely as an outpatient.  CkD stage I -2-needs close monitoring as an outpatient. Potentially secondary to diabetic nephropathy and hypertensive glomerulosclerosis   Discharge Exam: Filed Vitals:   06/15/13 0800  BP:   Pulse:   Temp: 98.5 F (36.9 C)  Resp:     General: Alert pleasant oriented unable to speak with me in English son able to transfer Cardiovascular: S1-S2 no murmur or gallop Respiratory: Clinically clear  Discharge Instructions  Discharge Orders   Future Orders Complete By Expires   Diet - low sodium heart healthy  As directed    Diet - low sodium heart healthy  As directed    Discharge instructions  As directed    Comments:     Low salt diet See Dr. Concepcion Elk in about 3-4 days Get lab ork when you see him Please take your meds as schedule Montor blood sugars as per prior insturctions from dr. Concepcion Elk   Discharge instructions  As directed  Comments:     Follow with PCP Dr. Concepcion Elk closely Medications have changed significantly-please take all your blood pressure medicines You are on a new medicine called lasix for heart failure-this makes you go to the restroom a lot.  Please use it until you follow up with a Cardiologist   Increase activity slowly  As directed    Increase activity slowly  As directed        Medication List    STOP taking these medications        glimepiride 4 MG tablet  Commonly known as:  AMARYL     LANTUS SOLOSTAR 100 UNIT/ML Sopn  Generic drug:  Insulin Glargine  Replaced by:  insulin glargine 100 UNIT/ML injection     metoprolol succinate 50 MG 24 hr tablet  Commonly known as:  TOPROL-XL  Replaced by:  metoprolol 50 MG tablet      TAKE these medications       cetirizine 10 MG tablet  Commonly known as:  ZYRTEC  Take 10 mg by mouth daily as needed for allergies.     furosemide 40 MG tablet  Commonly known as:  LASIX  Take 1 tablet (40 mg total) by mouth daily.     hydrALAZINE 50 MG tablet  Commonly known as:  APRESOLINE  Take 1 tablet (50 mg total) by mouth 2 (two) times daily.     insulin glargine 100 UNIT/ML injection  Commonly known as:  LANTUS  Inject 0.25 mLs (25 Units total) into the skin daily.     isosorbide mononitrate 30 MG 24 hr tablet  Commonly known as:  IMDUR  Take 1 tablet (30 mg total) by mouth daily.     metoprolol 50 MG tablet  Commonly known as:  LOPRESSOR  Take 1 tablet (50 mg total) by mouth 2 (two) times daily.       No Known Allergies Follow-up Information   Follow up with Dorrene German, MD.   Specialty:  Internal Medicine   Contact information:   [address] [place] [address] [phone]       Schedule an appointment as soon as possible for a visit with [facility], MD. (he is a cgood cardiologuist who cant alk with you)    Specialty:  Cardiology   Contact information:   [address] [place] [address] [phone]        The results of significant diagnostics from this hospitalization (including imaging, microbiology, ancillary and laboratory) are listed below for reference.    Significant Diagnostic Studies: Dg Chest Port 1 View  06/13/2013   CLINICAL DATA:  Shortness of breath  EXAM: PORTABLE CHEST - 1 VIEW  COMPARISON:  09/27/2011  FINDINGS: No cardiomegaly. Unchanged mediastinal contours. Diffuse interstitial opacities, likely with  small pleural effusions. Pulmonary blood flow is mildly cephalized. No asymmetric opacity.  IMPRESSION: Pulmonary edema with probable small pleural effusions.   Electronically Signed   By: Tiburcio Pea M.D.   On: 06/13/2013 02:35    Microbiology: Recent Results (from the past 240 hour(s))  MRSA PCR SCREENING     Status: None   Collection Time    06/13/13  5:25 AM      Result Value Range Status   MRSA by PCR NEGATIVE  NEGATIVE Final   Comment:            The GeneXpert MRSA Assay (FDA     approved for NASAL specimens     only), is one component of a  comprehensive MRSA colonization     surveillance program. It is not     intended to diagnose MRSA     infection nor to guide or     monitor treatment for     MRSA infections.     Labs: Basic Metabolic Panel:  Recent Labs Lab 06/13/13 1952 06/13/13 2215 06/14/13 0005 06/14/13 0330 06/15/13 0335  NA 133* 131* 133* 134* 133*  K 4.1 4.2 4.2 4.5 3.9  CL 101 99 100 96 101  CO2 20 22 22 21 22   GLUCOSE 192* 159* 109* 174* 98  BUN 44* 46* 48* 46* 53*  CREATININE 1.59* 1.68* 1.70* 1.50* 1.59*  CALCIUM 8.5 8.6 9.2 9.0 8.7   Liver Function Tests:  Recent Labs Lab 06/13/13 0225  AST 54*  ALT 65*  ALKPHOS 91  BILITOT 0.3  PROT 7.7  ALBUMIN 3.1*   No results found for this basename: LIPASE, AMYLASE,  in the last 168 hours No results found for this basename: AMMONIA,  in the last 168 hours CBC:  Recent Labs Lab 06/13/13 0225 06/13/13 0650 06/15/13 0335  WBC 7.5 8.9 11.1*  NEUTROABS 4.9  --   --   HGB 13.2 12.1 11.1*  HCT 38.8 35.6* 32.6*  MCV 90.0 90.4 90.3  PLT 203 183 174   Cardiac Enzymes:  Recent Labs Lab 06/13/13 0225 06/13/13 0650 06/13/13 1205  TROPONINI <0.30 <0.30 <0.30   BNP: BNP (last 3 results)  Recent Labs  06/13/13 0225  PROBNP 3376.0*   CBG:  Recent Labs Lab 06/14/13 2126 06/15/13 0812 06/15/13 0837 06/15/13 1032 06/15/13 1238  GLUCAP 157* 57* 79 175* 127*        Signed:  Soleia Badolato, JAI-GURMUKH  [facility] 06/15/2013, 2:40 PM

## 2013-06-15 NOTE — Progress Notes (Signed)
Inpatient Diabetes Program Recommendations  AACE/ADA: New Consensus Statement on Inpatient Glycemic Control (2013)  Target Ranges:  Prepandial:   less than 140 mg/dL      Peak postprandial:   less than 180 mg/dL (1-2 hours)      Critically ill patients:  140 - 180 mg/dL   Reason for Visit: Hypoglycemia  Results for TAMEYAH, KOCH (MRN [id number]) as of 06/15/2013 10:09  Ref. Range 06/14/2013 12:28 06/14/2013 17:33 06/14/2013 21:26 06/15/2013 08:12 06/15/2013 08:37  Glucose-Capillary Latest Range: 70-99 mg/dL 782 (H) 956 (H) 213 (H) 57 (L) 79   Consider decreasing Lantus to 22 units bid.  Will continue to follow daily. Thank you. Ailene Ards, RD, LDN, CDE Inpatient Diabetes Coordinator [phone]

## 2013-06-15 NOTE — Evaluation (Signed)
Physical Therapy Evaluation Patient Details Name: Cynthia Todd MRN: [id number] DOB: Oct 22, 1918 Today's Date: 06/15/2013 Time: 4098-1191 PT Time Calculation (min): 17 min  PT Assessment / Plan / Recommendation History of Present Illness  79 yo female with h/o hth, diastolic chf, RAD had sudden onset of sob tonight that awoke her.  No swelling.  No fevers.  No cough.  Her son takes her bp every 3 days on a regular basis, she does not know these numbers, her grandson who is present does not what her bp usually is.  On arrival she was hypoxic, dyspneic and sbp was over 240.  On arrival also was wheezing and had crackles per EDP.  Clinical Impression  **pt appears to be at baseline with mobility of walking with [facility], would benefit from RW for increased stability when ambulating. *    PT Assessment  Patent does not need any further PT services    Follow Up Recommendations  No PT follow up    Does the patient have the potential to tolerate intense rehabilitation      Barriers to Discharge        Equipment Recommendations  Rolling walker with 5" wheels    Recommendations for Other Services     Frequency      Precautions / Restrictions Precautions Precautions: None Restrictions Weight Bearing Restrictions: No   Pertinent Vitals/Pain *no c/o pain HR 58 at rest, 65 with walking SaO2 98% on RA walking**      Mobility  Bed Mobility Bed Mobility: Supine to Sit Supine to Sit: 6: Modified independent (Device/Increase time);With rails Transfers Transfers: Sit to Stand;Stand to Sit Sit to Stand: 4: Min guard Stand to Sit: 5: Supervision Details for Transfer Assistance: min/guard for safety Ambulation/Gait Ambulation/Gait Assistance: 5: Supervision Ambulation Distance (Feet): 130 Feet Assistive device: Rolling walker;Straight cane Ambulation/Gait Assistance Details: Pt ambulated 100' with SPC with mild unsteadiness but no actual LOB, 30' with RW with improved balance. Gait  Pattern: Decreased step length - right;Decreased step length - left Gait velocity: decreased    Exercises     PT Diagnosis:    PT Problem List:   PT Treatment Interventions:       PT Goals(Current goals can be found in the care plan section) Acute Rehab PT Goals PT Goal Formulation: No goals set, d/c therapy  Visit Information  Last PT Received On: 06/15/13 Assistance Needed: +1 History of Present Illness: 79 yo female with h/o hth, diastolic chf, RAD had sudden onset of sob tonight that awoke her.  No swelling.  No fevers.  No cough.  Her son takes her bp every 3 days on a regular basis, she does not know these numbers, her grandson who is present does not what her bp usually is.  On arrival she was hypoxic, dyspneic and sbp was over 240.  On arrival also was wheezing and had crackles per EDP.       Prior Functioning  Home Living Family/patient expects to be discharged to:: Private residence Living Arrangements: Children;Other relatives;Non-relatives/Friends Available Help at Discharge: Family Home Equipment: Gilmer Mor - single point Prior Function Level of Independence: Independent with assistive device(s) Communication Communication: Prefers language other than English (pt is from [place], family translated)    Cognition  Cognition Arousal/Alertness: Awake/alert Behavior During Therapy: WFL for tasks assessed/performed Overall Cognitive Status: Within Functional Limits for tasks assessed    Extremity/Trunk Assessment Upper Extremity Assessment Upper Extremity Assessment: Overall WFL for tasks assessed Lower Extremity Assessment Lower Extremity Assessment: Overall  WFL for tasks assessed Cervical / Trunk Assessment Cervical / Trunk Assessment: Normal   Balance Balance Balance Assessed: Yes Static Sitting Balance Static Sitting - Balance Support: Bilateral upper extremity supported;Feet supported Static Sitting - Level of Assistance: 7: Independent Static Sitting -  Comment/# of Minutes: 2  End of Session PT - End of Session Equipment Utilized During Treatment: Gait belt Activity Tolerance: Patient tolerated treatment well Patient left: in chair;with call bell/phone within reach;with family/visitor present Nurse Communication: Mobility status  GP     Ralene Bathe Kistler 06/15/2013, 1:54 PM [phone]

## 2013-06-15 NOTE — Progress Notes (Signed)
Rolling walker for home use ordered from [facility]

## 2013-06-16 LAB — GLUCOSE, CAPILLARY: Glucose-Capillary: 224 mg/dL — ABNORMAL HIGH (ref 70–99)

## 2014-11-16 ENCOUNTER — Inpatient Hospital Stay (HOSPITAL_COMMUNITY)
Admission: EM | Admit: 2014-11-16 | Discharge: 2014-11-20 | DRG: 291 | Disposition: A | Discharge status: Home / Self Care | Payer: Medicare Other | Attending: Family Medicine | Admitting: Family Medicine

## 2014-11-16 ENCOUNTER — Emergency Department (HOSPITAL_COMMUNITY): Payer: Medicare Other

## 2014-11-16 ENCOUNTER — Encounter (HOSPITAL_COMMUNITY): Payer: Self-pay

## 2014-11-16 DIAGNOSIS — J449 Chronic obstructive pulmonary disease, unspecified: Secondary | ICD-10-CM | POA: Diagnosis present

## 2014-11-16 DIAGNOSIS — J45909 Unspecified asthma, uncomplicated: Secondary | ICD-10-CM | POA: Diagnosis present

## 2014-11-16 DIAGNOSIS — R0602 Shortness of breath: Secondary | ICD-10-CM | POA: Diagnosis not present

## 2014-11-16 DIAGNOSIS — I161 Hypertensive emergency: Secondary | ICD-10-CM

## 2014-11-16 DIAGNOSIS — J9600 Acute respiratory failure, unspecified whether with hypoxia or hypercapnia: Secondary | ICD-10-CM | POA: Diagnosis present

## 2014-11-16 DIAGNOSIS — I10 Essential (primary) hypertension: Secondary | ICD-10-CM | POA: Diagnosis present

## 2014-11-16 DIAGNOSIS — Z794 Long term (current) use of insulin: Secondary | ICD-10-CM

## 2014-11-16 DIAGNOSIS — I248 Other forms of acute ischemic heart disease: Secondary | ICD-10-CM | POA: Diagnosis present

## 2014-11-16 DIAGNOSIS — I5033 Acute on chronic diastolic (congestive) heart failure: Principal | ICD-10-CM | POA: Diagnosis present

## 2014-11-16 DIAGNOSIS — E1121 Type 2 diabetes mellitus with diabetic nephropathy: Secondary | ICD-10-CM | POA: Diagnosis present

## 2014-11-16 DIAGNOSIS — J810 Acute pulmonary edema: Secondary | ICD-10-CM

## 2014-11-16 DIAGNOSIS — I160 Hypertensive urgency: Secondary | ICD-10-CM

## 2014-11-16 DIAGNOSIS — I272 Other secondary pulmonary hypertension: Secondary | ICD-10-CM | POA: Diagnosis present

## 2014-11-16 NOTE — ED Notes (Signed)
Family states that patient has been coughing and wheezing for two days, patients blood pressure is elevated in triage

## 2014-11-17 ENCOUNTER — Other Ambulatory Visit (HOSPITAL_COMMUNITY): Payer: Self-pay

## 2014-11-17 DIAGNOSIS — J810 Acute pulmonary edema: Secondary | ICD-10-CM | POA: Diagnosis not present

## 2014-11-17 DIAGNOSIS — I248 Other forms of acute ischemic heart disease: Secondary | ICD-10-CM | POA: Diagnosis present

## 2014-11-17 DIAGNOSIS — I5033 Acute on chronic diastolic (congestive) heart failure: Secondary | ICD-10-CM | POA: Diagnosis present

## 2014-11-17 DIAGNOSIS — R0602 Shortness of breath: Secondary | ICD-10-CM | POA: Diagnosis present

## 2014-11-17 DIAGNOSIS — N179 Acute kidney failure, unspecified: Secondary | ICD-10-CM | POA: Diagnosis not present

## 2014-11-17 DIAGNOSIS — R7989 Other specified abnormal findings of blood chemistry: Secondary | ICD-10-CM | POA: Diagnosis not present

## 2014-11-17 DIAGNOSIS — I10 Essential (primary) hypertension: Secondary | ICD-10-CM | POA: Diagnosis not present

## 2014-11-17 DIAGNOSIS — E1121 Type 2 diabetes mellitus with diabetic nephropathy: Secondary | ICD-10-CM | POA: Diagnosis present

## 2014-11-17 DIAGNOSIS — J45909 Unspecified asthma, uncomplicated: Secondary | ICD-10-CM | POA: Diagnosis present

## 2014-11-17 DIAGNOSIS — J449 Chronic obstructive pulmonary disease, unspecified: Secondary | ICD-10-CM | POA: Diagnosis present

## 2014-11-17 DIAGNOSIS — J9600 Acute respiratory failure, unspecified whether with hypoxia or hypercapnia: Secondary | ICD-10-CM | POA: Diagnosis not present

## 2014-11-17 DIAGNOSIS — I160 Hypertensive urgency: Secondary | ICD-10-CM

## 2014-11-17 DIAGNOSIS — Z794 Long term (current) use of insulin: Secondary | ICD-10-CM | POA: Diagnosis not present

## 2014-11-17 DIAGNOSIS — R079 Chest pain, unspecified: Secondary | ICD-10-CM | POA: Diagnosis not present

## 2014-11-17 DIAGNOSIS — I272 Other secondary pulmonary hypertension: Secondary | ICD-10-CM | POA: Diagnosis present

## 2014-11-17 LAB — COMPREHENSIVE METABOLIC PANEL
ALT: 56 U/L — ABNORMAL HIGH (ref 0–35)
ALT: 64 U/L — ABNORMAL HIGH (ref 0–35)
ANION GAP: 9 (ref 5–15)
AST: 35 U/L (ref 0–37)
AST: 43 U/L — AB (ref 0–37)
Albumin: 3.6 g/dL (ref 3.5–5.2)
Albumin: 3.6 g/dL (ref 3.5–5.2)
Alkaline Phosphatase: 104 U/L (ref 39–117)
Alkaline Phosphatase: 107 U/L (ref 39–117)
Anion gap: 11 (ref 5–15)
BILIRUBIN TOTAL: 0.8 mg/dL (ref 0.3–1.2)
BUN: 27 mg/dL — AB (ref 6–23)
BUN: 30 mg/dL — ABNORMAL HIGH (ref 6–23)
CHLORIDE: 101 mmol/L (ref 96–112)
CHLORIDE: 99 mmol/L (ref 96–112)
CO2: 21 mmol/L (ref 19–32)
CO2: 23 mmol/L (ref 19–32)
CREATININE: 1.3 mg/dL — AB (ref 0.50–1.10)
Calcium: 8.8 mg/dL (ref 8.4–10.5)
Calcium: 9 mg/dL (ref 8.4–10.5)
Creatinine, Ser: 1.42 mg/dL — ABNORMAL HIGH (ref 0.50–1.10)
GFR calc Af Amer: 35 mL/min — ABNORMAL LOW (ref >90)
GFR, EST AFRICAN AMERICAN: 39 mL/min — AB (ref >90)
GFR, EST NON AFRICAN AMERICAN: 31 mL/min — AB (ref >90)
GFR, EST NON AFRICAN AMERICAN: 34 mL/min — AB (ref >90)
Glucose, Bld: 172 mg/dL — ABNORMAL HIGH (ref 70–99)
Glucose, Bld: 226 mg/dL — ABNORMAL HIGH (ref 70–99)
POTASSIUM: 4.1 mmol/L (ref 3.5–5.1)
Potassium: 3.7 mmol/L (ref 3.5–5.1)
Sodium: 131 mmol/L — ABNORMAL LOW (ref 135–145)
Sodium: 133 mmol/L — ABNORMAL LOW (ref 135–145)
Total Bilirubin: 0.7 mg/dL (ref 0.3–1.2)
Total Protein: 8.1 g/dL (ref 6.0–8.3)
Total Protein: 8.5 g/dL — ABNORMAL HIGH (ref 6.0–8.3)

## 2014-11-17 LAB — GLUCOSE, CAPILLARY
Glucose-Capillary: 492 mg/dL — ABNORMAL HIGH (ref 70–99)
Glucose-Capillary: 349 mg/dL — ABNORMAL HIGH (ref 70–99)
Glucose-Capillary: 124 mg/dL — ABNORMAL HIGH (ref 70–99)

## 2014-11-17 LAB — CBC WITH DIFFERENTIAL/PLATELET
BASOS ABS: 0 10*3/uL (ref 0.0–0.1)
BASOS PCT: 0 % (ref 0–1)
Basophils Absolute: 0 10*3/uL (ref 0.0–0.1)
Basophils Relative: 0 % (ref 0–1)
EOS ABS: 0.1 10*3/uL (ref 0.0–0.7)
EOS PCT: 1 % (ref 0–5)
Eosinophils Absolute: 0 10*3/uL (ref 0.0–0.7)
Eosinophils Relative: 0 % (ref 0–5)
HEMATOCRIT: 36.6 % (ref 36.0–46.0)
HEMATOCRIT: 37 % (ref 36.0–46.0)
HEMOGLOBIN: 12.5 g/dL (ref 12.0–15.0)
Hemoglobin: 12.6 g/dL (ref 12.0–15.0)
Lymphocytes Relative: 10 % — ABNORMAL LOW (ref 12–46)
Lymphocytes Relative: 6 % — ABNORMAL LOW (ref 12–46)
Lymphs Abs: 0.5 10*3/uL — ABNORMAL LOW (ref 0.7–4.0)
Lymphs Abs: 1 10*3/uL (ref 0.7–4.0)
MCH: 31.2 pg (ref 26.0–34.0)
MCH: 31.3 pg (ref 26.0–34.0)
MCHC: 34.1 g/dL (ref 30.0–36.0)
MCHC: 34.2 g/dL (ref 30.0–36.0)
MCV: 91.6 fL (ref 78.0–100.0)
MCV: 91.7 fL (ref 78.0–100.0)
MONO ABS: 0.2 10*3/uL (ref 0.1–1.0)
MONOS PCT: 2 % — AB (ref 3–12)
MONOS PCT: 7 % (ref 3–12)
Monocytes Absolute: 0.7 10*3/uL (ref 0.1–1.0)
NEUTROS ABS: 7.9 10*3/uL — AB (ref 1.7–7.7)
Neutro Abs: 8.7 10*3/uL — ABNORMAL HIGH (ref 1.7–7.7)
Neutrophils Relative %: 82 % — ABNORMAL HIGH (ref 43–77)
Neutrophils Relative %: 92 % — ABNORMAL HIGH (ref 43–77)
Platelets: 245 10*3/uL (ref 150–400)
Platelets: 248 10*3/uL (ref 150–400)
RBC: 3.99 MIL/uL (ref 3.87–5.11)
RBC: 4.04 MIL/uL (ref 3.87–5.11)
RDW: 13.3 % (ref 11.5–15.5)
RDW: 13.5 % (ref 11.5–15.5)
WBC: 9.4 10*3/uL (ref 4.0–10.5)
WBC: 9.8 10*3/uL (ref 4.0–10.5)

## 2014-11-17 LAB — BRAIN NATRIURETIC PEPTIDE: B NATRIURETIC PEPTIDE 5: 1531.8 pg/mL — AB (ref 0.0–100.0)

## 2014-11-17 LAB — TROPONIN I
Troponin I: 0.04 ng/mL — ABNORMAL HIGH (ref <0.031)
Troponin I: 0.05 ng/mL — ABNORMAL HIGH (ref <0.031)
Troponin I: 0.06 ng/mL — ABNORMAL HIGH (ref <0.031)
Troponin I: 0.06 ng/mL — ABNORMAL HIGH (ref <0.031)

## 2014-11-17 LAB — CBG MONITORING, ED
Glucose-Capillary: 280 mg/dL — ABNORMAL HIGH (ref 70–99)
Glucose-Capillary: 495 mg/dL — ABNORMAL HIGH (ref 70–99)

## 2014-11-17 LAB — TSH: TSH: 1.816 u[IU]/mL (ref 0.350–4.500)

## 2014-11-17 MED ORDER — MORPHINE SULFATE 2 MG/ML IJ SOLN: 1.0000 mg | INTRAMUSCULAR | Status: DC | PRN

## 2014-11-17 MED ORDER — LEVOFLOXACIN IN D5W 500 MG/100ML IV SOLN
500.0000 mg | INTRAVENOUS | Status: DC
  Administered 2014-11-17 – 2014-11-19 (×3): 500 mg via INTRAVENOUS
  Filled 2014-11-17 (×4): qty 100

## 2014-11-17 MED ORDER — DOXYCYCLINE HYCLATE 100 MG IV SOLR
100.0000 mg | Freq: Two times a day (BID) | INTRAVENOUS | Status: DC
  Administered 2014-11-17 (×2): 100 mg via INTRAVENOUS
  Filled 2014-11-17 (×3): qty 100

## 2014-11-17 MED ORDER — ALBUTEROL SULFATE (2.5 MG/3ML) 0.083% IN NEBU
5.0000 mg | INHALATION_SOLUTION | Freq: Once | RESPIRATORY_TRACT | Status: AC
  Administered 2014-11-17: 5 mg via RESPIRATORY_TRACT
  Filled 2014-11-17: qty 6

## 2014-11-17 MED ORDER — INSULIN ASPART 100 UNIT/ML ~~LOC~~ SOLN
5.0000 [IU] | Freq: Three times a day (TID) | SUBCUTANEOUS | Status: DC
  Administered 2014-11-18 – 2014-11-20 (×8): 5 [IU] via SUBCUTANEOUS

## 2014-11-17 MED ORDER — ISOSORB DINITRATE-HYDRALAZINE 20-37.5 MG PO TABS
1.0000 | ORAL_TABLET | Freq: Two times a day (BID) | ORAL | Status: DC
  Administered 2014-11-17 – 2014-11-20 (×7): 1 via ORAL
  Filled 2014-11-17 (×9): qty 1

## 2014-11-17 MED ORDER — IPRATROPIUM-ALBUTEROL 0.5-2.5 (3) MG/3ML IN SOLN
3.0000 mL | RESPIRATORY_TRACT | Status: DC
  Administered 2014-11-17 (×4): 3 mL via RESPIRATORY_TRACT
  Filled 2014-11-17 (×4): qty 3

## 2014-11-17 MED ORDER — FUROSEMIDE 10 MG/ML IJ SOLN: 40.0000 mg | Freq: Two times a day (BID) | INTRAMUSCULAR | Status: DC

## 2014-11-17 MED ORDER — SODIUM CHLORIDE 0.9 % IJ SOLN: 3.0000 mL | INTRAMUSCULAR | Status: DC | PRN

## 2014-11-17 MED ORDER — FUROSEMIDE 10 MG/ML IJ SOLN
40.0000 mg | Freq: Two times a day (BID) | INTRAMUSCULAR | Status: DC
  Administered 2014-11-17: 40 mg via INTRAVENOUS
  Filled 2014-11-17: qty 4

## 2014-11-17 MED ORDER — INSULIN GLARGINE 100 UNIT/ML ~~LOC~~ SOLN
25.0000 [IU] | Freq: Every day | SUBCUTANEOUS | Status: DC
  Administered 2014-11-17: 25 [IU] via SUBCUTANEOUS
  Filled 2014-11-17 (×2): qty 0.25

## 2014-11-17 MED ORDER — SODIUM CHLORIDE 0.9 % IJ SOLN
3.0000 mL | Freq: Two times a day (BID) | INTRAMUSCULAR | Status: DC
  Administered 2014-11-17 – 2014-11-19 (×4): 3 mL via INTRAVENOUS

## 2014-11-17 MED ORDER — INSULIN ASPART 100 UNIT/ML ~~LOC~~ SOLN: 0.0000 [IU] | Freq: Three times a day (TID) | SUBCUTANEOUS | Status: DC

## 2014-11-17 MED ORDER — INSULIN GLARGINE 100 UNIT/ML ~~LOC~~ SOLN
30.0000 [IU] | Freq: Every day | SUBCUTANEOUS | Status: DC
  Administered 2014-11-18 – 2014-11-20 (×3): 30 [IU] via SUBCUTANEOUS
  Filled 2014-11-17 (×3): qty 0.3

## 2014-11-17 MED ORDER — INSULIN ASPART 100 UNIT/ML ~~LOC~~ SOLN
16.0000 [IU] | Freq: Once | SUBCUTANEOUS | Status: AC
  Administered 2014-11-17: 16 [IU] via SUBCUTANEOUS

## 2014-11-17 MED ORDER — INSULIN ASPART 100 UNIT/ML ~~LOC~~ SOLN
0.0000 [IU] | Freq: Three times a day (TID) | SUBCUTANEOUS | Status: DC
  Administered 2014-11-17: 11 [IU] via SUBCUTANEOUS
  Administered 2014-11-18: 3 [IU] via SUBCUTANEOUS
  Administered 2014-11-18 (×2): 5 [IU] via SUBCUTANEOUS
  Administered 2014-11-19: 15 [IU] via SUBCUTANEOUS
  Administered 2014-11-19 – 2014-11-20 (×4): 3 [IU] via SUBCUTANEOUS
  Filled 2014-11-17 (×2): qty 1

## 2014-11-17 MED ORDER — IPRATROPIUM-ALBUTEROL 0.5-2.5 (3) MG/3ML IN SOLN
3.0000 mL | Freq: Three times a day (TID) | RESPIRATORY_TRACT | Status: DC
  Administered 2014-11-18: 3 mL via RESPIRATORY_TRACT
  Filled 2014-11-17: qty 3

## 2014-11-17 MED ORDER — HEPARIN SODIUM (PORCINE) 5000 UNIT/ML IJ SOLN
5000.0000 [IU] | Freq: Three times a day (TID) | INTRAMUSCULAR | Status: DC
  Administered 2014-11-17 – 2014-11-20 (×8): 5000 [IU] via SUBCUTANEOUS
  Filled 2014-11-17 (×12): qty 1

## 2014-11-17 MED ORDER — LABETALOL HCL 5 MG/ML IV SOLN
20.0000 mg | Freq: Once | INTRAVENOUS | Status: AC
  Administered 2014-11-17: 20 mg via INTRAVENOUS
  Filled 2014-11-17: qty 4

## 2014-11-17 MED ORDER — INSULIN ASPART PROT & ASPART (70-30 MIX) 100 UNIT/ML ~~LOC~~ SUSP: 16.0000 [IU] | Freq: Once | SUBCUTANEOUS | Status: DC

## 2014-11-17 MED ORDER — METOPROLOL SUCCINATE ER 50 MG PO TB24
50.0000 mg | ORAL_TABLET | Freq: Every day | ORAL | Status: DC
  Administered 2014-11-17 – 2014-11-20 (×4): 50 mg via ORAL
  Filled 2014-11-17 (×5): qty 1

## 2014-11-17 MED ORDER — FUROSEMIDE 10 MG/ML IJ SOLN
60.0000 mg | Freq: Once | INTRAMUSCULAR | Status: AC
  Administered 2014-11-17: 60 mg via INTRAVENOUS
  Filled 2014-11-17: qty 8

## 2014-11-17 MED ORDER — SODIUM CHLORIDE 0.9 % IJ SOLN
3.0000 mL | Freq: Two times a day (BID) | INTRAMUSCULAR | Status: DC
  Administered 2014-11-17 – 2014-11-19 (×3): 3 mL via INTRAVENOUS

## 2014-11-17 MED ORDER — IPRATROPIUM BROMIDE 0.02 % IN SOLN
0.5000 mg | Freq: Once | RESPIRATORY_TRACT | Status: AC
  Administered 2014-11-17: 0.5 mg via RESPIRATORY_TRACT
  Filled 2014-11-17: qty 2.5

## 2014-11-17 MED ORDER — HYDRALAZINE HCL 20 MG/ML IJ SOLN
10.0000 mg | Freq: Four times a day (QID) | INTRAMUSCULAR | Status: DC | PRN
  Administered 2014-11-17 – 2014-11-19 (×2): 10 mg via INTRAVENOUS
  Filled 2014-11-17 (×2): qty 1

## 2014-11-17 MED ORDER — METHYLPREDNISOLONE SODIUM SUCC 125 MG IJ SOLR
80.0000 mg | Freq: Two times a day (BID) | INTRAMUSCULAR | Status: DC
  Administered 2014-11-17: 80 mg via INTRAVENOUS
  Filled 2014-11-17: qty 2

## 2014-11-17 MED ORDER — FUROSEMIDE 10 MG/ML IJ SOLN
40.0000 mg | Freq: Two times a day (BID) | INTRAMUSCULAR | Status: DC
  Administered 2014-11-18: 40 mg via INTRAVENOUS
  Filled 2014-11-17: qty 4

## 2014-11-17 MED ORDER — IPRATROPIUM-ALBUTEROL 0.5-2.5 (3) MG/3ML IN SOLN: 3.0000 mL | RESPIRATORY_TRACT | Status: DC | PRN

## 2014-11-17 MED ORDER — SODIUM CHLORIDE 0.9 % IV SOLN: 250.0000 mL | INTRAVENOUS | Status: DC | PRN

## 2014-11-17 MED ORDER — METHYLPREDNISOLONE SODIUM SUCC 125 MG IJ SOLR
60.0000 mg | Freq: Two times a day (BID) | INTRAMUSCULAR | Status: DC
  Administered 2014-11-17 – 2014-11-20 (×6): 60 mg via INTRAVENOUS
  Filled 2014-11-17 (×5): qty 0.96
  Filled 2014-11-17: qty 2
  Filled 2014-11-17: qty 0.96

## 2014-11-17 MED ORDER — ASPIRIN 325 MG PO TABS
325.0000 mg | ORAL_TABLET | Freq: Every day | ORAL | Status: DC
  Administered 2014-11-17 – 2014-11-20 (×4): 325 mg via ORAL
  Filled 2014-11-17 (×4): qty 1

## 2014-11-17 NOTE — H&P (Signed)
Hospitalist Admission History and Physical  Patient name: Cynthia Todd Medical record number: [id number] Date of birth: [birth year] Age: 81 y.o. Gender: female  Primary Care Provider: Dorrene German, MD  Chief Complaint: acute resp failure, hypertensive urgency, elevated trop  History of Present Illness:This is a 81 y.o. year old female with significant past medical history of diastolic CHF, pulm HTN, HTN, IDDM presenting with acute resp failure, hypertensive urgency. Family report pt w/ increased cough, SOB, wheezing over past 1-2 days. No fevers or chills. Per grandson, pt ran out of one her BP meds earlier in the week. Cough has progressively gotten worse. Family deny any LE swelling, orthopnea, PND  Presented to the ER Tmax 99.4, HR 60s-80s, resp 20s, BP 160s-220s/50s-80s, satting 98% on RA. CBC WBL. Cr 1.3. Trop 0.06. Glu 172. CXR probable mild CHF. Pt given albuterol in ER w/ improvement in resp status.   Assessment and Plan: ISLAM VILLESCAS is a 81 y.o. year old female presenting with acute resp failure, hypertensive urgency, elevated trop    Active Problems:   Acute respiratory failure   Hypertensive urgency   1-Acute resp failure  -likely multifactorial in setting of CHF flare, ?bronchitis/asthma/COPD -IV lasix -strict Is and Os and daily weights -follow UOP -IV solumedrol, duonebs, IV doxycycline -no resp distress, hypoxia at present  2-Hypertensive Urgency  -elevated BP on presentation -s/p IV labetalol w/ BPs going into 160s -restart home regimen -prn clonidine -tele bed  3-Elevated trop  -+ pleuritic pain on exam and history -noted trop 0.06 -likely mild demand mismatch in setting of above.  -EKG NSR w/ LVH -cycle CEs -tele bed -cont ASA  4-IDDM -SSI  -A1C  5-diastolic CHF -EF 40-98% and grade 2 diastolic dysfunction on 2D ECHO 06/2013 -IV lasix x 1 -follow UOP   FEN/GI: heart healthy carb modified diet  Prophylaxis: sub q heparin  Disposition:  pending further evaluation Code Status: Full Code    Patient Active Problem List   Diagnosis Date Noted  . Hypertensive urgency 11/17/2014  . Diabetic nephropathy 06/15/2013  . Acute respiratory failure 06/15/2013  . Insulin dependent diabetes mellitus 06/14/2013  . Acute on chronic diastolic CHF (congestive heart failure) 06/13/2013  . Influenza 10/02/2011  . Diastolic heart failure, NYHA class 1 09/29/2011  . Pulmonary HTN 09/29/2011  . Shortness of breath 09/28/2011  . Hypokalemia 09/28/2011  . Chest pain on breathing 09/28/2011  . Diabetes mellitus 09/28/2011  . Malignant hypertension 09/28/2011   Past Medical History: Past Medical History  Diagnosis Date  . Diabetes mellitus   . Hypertension     Past Surgical History: History reviewed. No pertinent past surgical history.  Social History: History   Social History  . Marital Status: Widowed    Spouse Name: N/A  . Number of Children: N/A  . Years of Education: N/A   Social History Main Topics  . Smoking status: Never Smoker   . Smokeless tobacco: Not on file  . Alcohol Use: No  . Drug Use: Not on file  . Sexual Activity: Not on file   Other Topics Concern  . None   Social History Narrative    Family History: History reviewed. No pertinent family history.  Allergies: No Known Allergies  Current Facility-Administered Medications  Medication Dose Route Frequency Provider Last Rate Last Dose  . 0.9 %  sodium chloride infusion  250 mL Intravenous PRN Floydene Flock, MD      . doxycycline (VIBRAMYCIN) 100 mg in dextrose 5 % 250  mL IVPB  100 mg Intravenous BID Floydene Flock, MD      . heparin injection 5,000 Units  5,000 Units Subcutaneous 3 times per day Floydene Flock, MD      . insulin glargine (LANTUS) injection 25 Units  25 Units Subcutaneous Daily Floydene Flock, MD      . ipratropium-albuterol (DUONEB) 0.5-2.5 (3) MG/3ML nebulizer solution 3 mL  3 mL Nebulization Q4H Floydene Flock, MD      .  ipratropium-albuterol (DUONEB) 0.5-2.5 (3) MG/3ML nebulizer solution 3 mL  3 mL Nebulization Q2H PRN Floydene Flock, MD      . isosorbide-hydrALAZINE (BIDIL) 20-37.5 MG per tablet 1 tablet  1 tablet Oral BID Floydene Flock, MD      . methylPREDNISolone sodium succinate (SOLU-MEDROL) 125 mg/2 mL injection 80 mg  80 mg Intravenous Q12H Floydene Flock, MD      . metoprolol succinate (TOPROL-XL) 24 hr tablet 50 mg  50 mg Oral Daily Floydene Flock, MD      . morphine 2 MG/ML injection 1-2 mg  1-2 mg Intravenous Q3H PRN Floydene Flock, MD      . sodium chloride 0.9 % injection 3 mL  3 mL Intravenous Q12H Floydene Flock, MD      . sodium chloride 0.9 % injection 3 mL  3 mL Intravenous Q12H Floydene Flock, MD      . sodium chloride 0.9 % injection 3 mL  3 mL Intravenous PRN Floydene Flock, MD       Current Outpatient Prescriptions  Medication Sig Dispense Refill  . albuterol (PROVENTIL HFA;VENTOLIN HFA) 108 (90 BASE) MCG/ACT inhaler Inhale 2 puffs into the lungs every 6 (six) hours as needed for wheezing or shortness of breath.    . cetirizine (ZYRTEC) 10 MG tablet Take 10 mg by mouth daily as needed for allergies.    . Diclofenac Sodium 1.5 % SOLN Place 40 each onto the skin as needed (pain).    Marland Kitchen glimepiride (AMARYL) 4 MG tablet Take 4 mg by mouth daily with breakfast.    . insulin glargine (LANTUS) 100 UNIT/ML injection Inject 0.25 mLs (25 Units total) into the skin daily. (Patient taking differently: Inject 40 Units into the skin daily. ) 10 mL 12  . isosorbide-hydrALAZINE (BIDIL) 20-37.5 MG per tablet Take 1 tablet by mouth 2 (two) times daily.    Marland Kitchen losartan (COZAAR) 100 MG tablet Take 100 mg by mouth daily.    . metoprolol succinate (TOPROL-XL) 50 MG 24 hr tablet Take 50 mg by mouth daily. Take with or immediately following a meal.    . furosemide (LASIX) 40 MG tablet Take 1 tablet (40 mg total) by mouth daily. (Patient not taking: Reported on 11/16/2014) 30 tablet 0  . hydrALAZINE  (APRESOLINE) 50 MG tablet Take 1 tablet (50 mg total) by mouth 2 (two) times daily. (Patient not taking: Reported on 11/16/2014) 60 tablet 0  . isosorbide mononitrate (IMDUR) 30 MG 24 hr tablet Take 1 tablet (30 mg total) by mouth daily. (Patient not taking: Reported on 11/16/2014) 30 tablet 0  . metoprolol (LOPRESSOR) 50 MG tablet Take 1 tablet (50 mg total) by mouth 2 (two) times daily. (Patient not taking: Reported on 11/16/2014) 60 tablet 0   Review Of Systems: 12 point ROS negative except as noted above in HPI.  Physical Exam: Filed Vitals:   11/17/14 0230  BP: 160/59  Pulse: 67  Temp:   Resp: 26  General: cooperative and mild distress 2/2 pain  HEENT: PERRLA and extra ocular movement intact Heart: S1, S2 normal, no murmur, rub or gallop, regular rate and rhythm Lungs: unlabored breathing, expiratory wheezes and bibasilar rales Abdomen: abdomen is soft without significant tenderness, masses, organomegaly or guarding Extremities: extremities normal, atraumatic, no cyanosis or edema Skin:no rashes Neurology: normal without focal findings  Labs and Imaging: Lab Results  Component Value Date/Time   NA 133* 11/17/2014 12:16 AM   K 4.1 11/17/2014 12:16 AM   CL 101 11/17/2014 12:16 AM   CO2 21 11/17/2014 12:16 AM   BUN 30* 11/17/2014 12:16 AM   CREATININE 1.30* 11/17/2014 12:16 AM   GLUCOSE 172* 11/17/2014 12:16 AM   Lab Results  Component Value Date   WBC 9.8 11/17/2014   HGB 12.5 11/17/2014   HCT 36.6 11/17/2014   MCV 91.7 11/17/2014   PLT 248 11/17/2014    Dg Chest 2 View  11/17/2014   CLINICAL DATA:  Coughing and wheezing for 2 days.  Hypertension.  EXAM: CHEST  2 VIEW  COMPARISON:  06/13/2013  FINDINGS: There is moderate vascular and interstitial congestion. There is mild posterior base opacity which may represent edema, but infectious infiltrate cannot be entirely excluded. No effusions are evident. There is mild cardiomegaly, unchanged.  IMPRESSION: Probable mild  congestive heart failure.   Electronically Signed   By: Ellery Plunkaniel R Mitchell M.D.   On: 11/17/2014 01:05           Doree AlbeeSteven Joshawn Crissman MD  Pager: [phone]

## 2014-11-17 NOTE — Progress Notes (Addendum)
Pt seen and examined, admitted this am per dr.NEwton, please see H&P for details 1. Acute Resp failure -due to Asthma/bronchitis with Acute Diastolic CHF -continue IV lasix, levaquin -duonebs, cut down solumedrol  2. Hypertensive Urgency -improved, continue bidil, toprol -lasix  3. Minimally elevated troponin -due to demand ischemia from 1 -trend troponin, check ECHO  -add ASA, continue troprol  4. DM -continue lantus, SSI  Zannie CovePreetha Aviya Jarvie, MD [phone]

## 2014-11-17 NOTE — ED Notes (Signed)
M.d. Asked for RN to give 16 units Novolog once, from sliding scal

## 2014-11-17 NOTE — ED Notes (Signed)
Bed: WA29 Expected date:  Expected time:  Means of arrival:  Comments: HOLD for room 13

## 2014-11-17 NOTE — ED Notes (Signed)
Returned from xray

## 2014-11-17 NOTE — ED Provider Notes (Addendum)
CSN: [id number]     Arrival date & time 11/16/14  2237 History   First MD Initiated Contact with Patient 11/16/14 2334     Chief Complaint  Patient presents with  . 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  Patient speaks Somali  HPI Patient is brought to emergency department for coughing and wheezing and shortness of breath worsening over the past 2-3 days.  No reports of fever but the patient has had chills.  No reports of nausea vomiting or diarrhea.  Appetite is been normal.  Patient has an inhaler and try this at home without improvement in her symptoms.  She is hypertensive on arrival to the emergency department however family reports compliance with her hypertension medications.  Patient denies orthopnea.  Symptoms are mild to moderate in severity.   Past Medical History  Diagnosis Date  . Diabetes mellitus   . Hypertension    History reviewed. No pertinent past surgical history. History reviewed. No pertinent family history. History  Substance Use Topics  . Smoking status: Never Smoker   . Smokeless tobacco: Not on file  . Alcohol Use: No   OB History    No data available     Review of Systems  All other systems reviewed and are negative.     Allergies  Review of patient's allergies indicates no known allergies.  Home Medications   Prior to Admission medications   Medication Sig Start Date End Date Taking? Authorizing Provider  albuterol (PROVENTIL HFA;VENTOLIN HFA) 108 (90 BASE) MCG/ACT inhaler Inhale 2 puffs into the lungs every 6 (six) hours as needed for wheezing or shortness of breath.   Yes Historical Provider, MD  cetirizine (ZYRTEC) 10 MG tablet Take 10 mg by mouth daily as needed for allergies.   Yes Historical Provider, MD  Diclofenac Sodium 1.5 % SOLN Place 40 each onto the skin as needed (pain).   Yes Historical Provider, MD  glimepiride (AMARYL) 4 MG tablet Take 4 mg by mouth daily with breakfast.   Yes Historical Provider, MD  insulin  glargine (LANTUS) 100 UNIT/ML injection Inject 0.25 mLs (25 Units total) into the skin daily. Patient taking differently: Inject 40 Units into the skin daily.  06/15/13  Yes Rhetta Mura, MD  isosorbide-hydrALAZINE (BIDIL) 20-37.5 MG per tablet Take 1 tablet by mouth 2 (two) times daily.   Yes Historical Provider, MD  losartan (COZAAR) 100 MG tablet Take 100 mg by mouth daily.   Yes Historical Provider, MD  metoprolol succinate (TOPROL-XL) 50 MG 24 hr tablet Take 50 mg by mouth daily. Take with or immediately following a meal.   Yes Historical Provider, MD  furosemide (LASIX) 40 MG tablet Take 1 tablet (40 mg total) by mouth daily. Patient not taking: Reported on 11/16/2014 06/15/13   Rhetta Mura, MD  hydrALAZINE (APRESOLINE) 50 MG tablet Take 1 tablet (50 mg total) by mouth 2 (two) times daily. Patient not taking: Reported on 11/16/2014 06/15/13   Rhetta Mura, MD  isosorbide mononitrate (IMDUR) 30 MG 24 hr tablet Take 1 tablet (30 mg total) by mouth daily. Patient not taking: Reported on 11/16/2014 06/15/13   Rhetta Mura, MD  metoprolol (LOPRESSOR) 50 MG tablet Take 1 tablet (50 mg total) by mouth 2 (two) times daily. Patient not taking: Reported on 11/16/2014 06/15/13   Rhetta Mura, MD   BP 221/96 mmHg  Pulse 77  Temp(Src) 99.4 F (37.4 C) (Rectal)  Resp 20  SpO2 95% Physical Exam  Constitutional: She is oriented  to person, place, and time. She appears well-developed and well-nourished. No distress.  HENT:  Head: Normocephalic and atraumatic.  Eyes: EOM are normal.  Neck: Normal range of motion.  Cardiovascular: Normal rate, regular rhythm and normal heart sounds.   Pulmonary/Chest: Effort normal. She has wheezes. She has rales.  Abdominal: Soft. She exhibits no distension. There is no tenderness.  Musculoskeletal: Normal range of motion.  Neurological: She is alert and oriented to person, place, and time.  Skin: Skin is warm and dry.  Psychiatric:  She has a normal mood and affect. Judgment normal.  Nursing note and vitals reviewed.   ED Course  Procedures (including critical care time) Labs Review Labs Reviewed  CBC WITH DIFFERENTIAL/PLATELET - Abnormal; Notable for the following:    Neutrophils Relative % 82 (*)    Neutro Abs 7.9 (*)    Lymphocytes Relative 10 (*)    All other components within normal limits  COMPREHENSIVE METABOLIC PANEL - Abnormal; Notable for the following:    Sodium 133 (*)    Glucose, Bld 172 (*)    BUN 30 (*)    Creatinine, Ser 1.30 (*)    AST 43 (*)    ALT 64 (*)    GFR calc non Af Amer 34 (*)    GFR calc Af Amer 39 (*)    All other components within normal limits  TROPONIN I - Abnormal; Notable for the following:    Troponin I 0.06 (*)    All other components within normal limits  BRAIN NATRIURETIC PEPTIDE - Abnormal; Notable for the following:    B Natriuretic Peptide 1531.8 (*)    All other components within normal limits    Imaging Review Dg Chest 2 View  11/17/2014   CLINICAL DATA:  Coughing and wheezing for 2 days.  Hypertension.  EXAM: CHEST  2 VIEW  COMPARISON:  06/13/2013  FINDINGS: There is moderate vascular and interstitial congestion. There is mild posterior base opacity which may represent edema, but infectious infiltrate cannot be entirely excluded. No effusions are evident. There is mild cardiomegaly, unchanged.  IMPRESSION: Probable mild congestive heart failure.   Electronically Signed   By: Ellery Plunkaniel R Mitchell M.D.   On: 11/17/2014 01:05  I personally reviewed the imaging tests through PACS system I reviewed available ER/hospitalization records through the EMR   ECG interpretation  Date: 11/17/2014  Rate: 80  Rhythm: normal sinus rhythm  QRS Axis: normal  Intervals: normal  ST/T Wave abnormalities: normal  Conduction Disutrbances: none  Narrative Interpretation:   Old EKG Reviewed: No significant changes noted     MDM   Final diagnoses:  Hypertensive emergency   Acute pulmonary edema    Patient seems to be responding to treatment while in the emergency department.  She remained short of breath.  Given her advanced age and her hypertension arrival she will be admitted the hospital for ongoing management and treatment of what appears to be hypertensive emergency with pulmonary edema.  There also could be a significant component of COPD    Azalia BilisKevin Billy Turvey, MD 11/17/14 [id number]  Azalia BilisKevin Naylea Wigington, MD 11/17/14 [phone]

## 2014-11-17 NOTE — Progress Notes (Signed)
Pt with a nose bleed earlier in shift stopped after applying pressure to left nostril. Currently presents with coughing up a small amt of bld, NP notified, held heparin SQ. Will cont to monitor, VSs. SRP, RN

## 2014-11-18 DIAGNOSIS — J9600 Acute respiratory failure, unspecified whether with hypoxia or hypercapnia: Secondary | ICD-10-CM

## 2014-11-18 DIAGNOSIS — I10 Essential (primary) hypertension: Secondary | ICD-10-CM

## 2014-11-18 LAB — COMPREHENSIVE METABOLIC PANEL
ALK PHOS: 79 U/L (ref 39–117)
ALT: 40 U/L — AB (ref 0–35)
ANION GAP: 13 (ref 5–15)
AST: 33 U/L (ref 0–37)
Albumin: 3.2 g/dL — ABNORMAL LOW (ref 3.5–5.2)
BILIRUBIN TOTAL: 0.6 mg/dL (ref 0.3–1.2)
BUN: 49 mg/dL — ABNORMAL HIGH (ref 6–23)
CHLORIDE: 98 mmol/L (ref 96–112)
CO2: 21 mmol/L (ref 19–32)
CREATININE: 1.85 mg/dL — AB (ref 0.50–1.10)
Calcium: 8.8 mg/dL (ref 8.4–10.5)
GFR calc Af Amer: 26 mL/min — ABNORMAL LOW (ref >90)
GFR, EST NON AFRICAN AMERICAN: 22 mL/min — AB (ref >90)
Glucose, Bld: 230 mg/dL — ABNORMAL HIGH (ref 70–99)
Potassium: 4 mmol/L (ref 3.5–5.1)
Sodium: 132 mmol/L — ABNORMAL LOW (ref 135–145)
TOTAL PROTEIN: 7.3 g/dL (ref 6.0–8.3)

## 2014-11-18 LAB — CBC WITH DIFFERENTIAL/PLATELET
BASOS ABS: 0 10*3/uL (ref 0.0–0.1)
Basophils Relative: 0 % (ref 0–1)
EOS ABS: 0 10*3/uL (ref 0.0–0.7)
EOS PCT: 0 % (ref 0–5)
HEMATOCRIT: 34.1 % — AB (ref 36.0–46.0)
Hemoglobin: 11.7 g/dL — ABNORMAL LOW (ref 12.0–15.0)
LYMPHS PCT: 6 % — AB (ref 12–46)
Lymphs Abs: 0.8 10*3/uL (ref 0.7–4.0)
MCH: 31.4 pg (ref 26.0–34.0)
MCHC: 34.3 g/dL (ref 30.0–36.0)
MCV: 91.4 fL (ref 78.0–100.0)
MONOS PCT: 3 % (ref 3–12)
Monocytes Absolute: 0.4 10*3/uL (ref 0.1–1.0)
NEUTROS ABS: 12.7 10*3/uL — AB (ref 1.7–7.7)
Neutrophils Relative %: 91 % — ABNORMAL HIGH (ref 43–77)
Platelets: 226 10*3/uL (ref 150–400)
RBC: 3.73 MIL/uL — ABNORMAL LOW (ref 3.87–5.11)
RDW: 13.3 % (ref 11.5–15.5)
WBC: 14 10*3/uL — ABNORMAL HIGH (ref 4.0–10.5)

## 2014-11-18 LAB — GLUCOSE, CAPILLARY
Glucose-Capillary: 215 mg/dL — ABNORMAL HIGH (ref 70–99)
Glucose-Capillary: 248 mg/dL — ABNORMAL HIGH (ref 70–99)
Glucose-Capillary: 194 mg/dL — ABNORMAL HIGH (ref 70–99)
Glucose-Capillary: 183 mg/dL — ABNORMAL HIGH (ref 70–99)

## 2014-11-18 MED ORDER — FUROSEMIDE 10 MG/ML IJ SOLN: 40.0000 mg | Freq: Every day | INTRAMUSCULAR | Status: DC

## 2014-11-18 NOTE — Progress Notes (Signed)
[facility] PROGRESS NOTE  Cynthia Todd ZOX:[id number] DOB: 08/05/1919 DOA: 11/16/2014 PCP: Dorrene GermanAVBUERE,EDWIN A, MD  Assessment/Plan:  Active Problems:   Acute respiratory failure - Multifactorial CHF (diastolic) and bronchitis - We'll continue current regimen as patient is improving on this regimen - Given elevation of serum creatinine we'll plan on placing on 40 of Lasix daily IV.    Hypertensive urgency - resolved - no new complaints regarding headaches, blurred vision, or abdominal pain.  Elevated troponin - In context of patient presents with acute diastolic CHF. Most likely demand ischemia. Patient not complaining of any chest discomfort  Code Status:  Full  Family Communication: discussed with family at bedside Disposition Plan: Discharge next a.m. with continued improvement  Consultants:  None  Procedures:  None  Antibiotics:  Levaquin  HPI/Subjective: Patient has no new complaints. No acute issues overnight  Objective: Filed Vitals:   11/18/14 0443  BP: 147/61  Pulse: 75  Temp: 98 F (36.7 C)  Resp: 18    Intake/Output Summary (Last 24 hours) at 11/18/14 1330 Last data filed at 11/18/14 0935  Gross per 24 hour  Intake    340 ml  Output    300 ml  Net     40 ml   Filed Weights   11/17/14 1619 11/18/14 0443  Weight: 61 kg (134 lb 7.7 oz) 61.2 kg (134 lb 14.7 oz)    Exam:   General:  Patient in no acute distress. Alert and awake  Cardiovascular: Regular rate and rhythm, no murmurs or rubs  Respiratory: Clear to auscultation, decreased breath sounds at bases, equal chest rise  Abdomen: Soft, nondistended, nontender  Musculoskeletal: No cyanosis or clubbing. On limited exam   Data Reviewed: Basic Metabolic Panel:  Recent Labs Lab 11/17/14 0016 11/17/14 0638 11/18/14 0456  NA 133* 131* 132*  K 4.1 3.7 4.0  CL 101 99 98  CO2 21 23 21   GLUCOSE 172* 226* 230*  BUN 30* 27* 49*  CREATININE 1.30* 1.42* 1.85*  CALCIUM 9.0 8.8 8.8    Liver Function Tests:  Recent Labs Lab 11/17/14 0016 11/17/14 0638 11/18/14 0456  AST 43* 35 33  ALT 64* 56* 40*  ALKPHOS 107 104 79  BILITOT 0.7 0.8 0.6  PROT 8.1 8.5* 7.3  ALBUMIN 3.6 3.6 3.2*   No results for input(s): LIPASE, AMYLASE in the last 168 hours. No results for input(s): AMMONIA in the last 168 hours. CBC:  Recent Labs Lab 11/17/14 0016 11/17/14 0638 11/18/14 0456  WBC 9.8 9.4 14.0*  NEUTROABS 7.9* 8.7* 12.7*  HGB 12.5 12.6 11.7*  HCT 36.6 37.0 34.1*  MCV 91.7 91.6 91.4  PLT 248 245 226   Cardiac Enzymes:  Recent Labs Lab 11/17/14 0016 11/17/14 0336 11/17/14 0915 11/17/14 1429  TROPONINI 0.06* 0.06* 0.05* 0.04*   BNP (last 3 results)  Recent Labs  11/17/14 0016  BNP 1531.8*    ProBNP (last 3 results) No results for input(s): PROBNP in the last 8760 hours.  CBG:  Recent Labs Lab 11/17/14 1407 11/17/14 1507 11/17/14 1630 11/17/14 2229 11/18/14 0757  GLUCAP 495* 492* 349* 124* 215*    No results found for this or any previous visit (from the past 240 hour(s)).   Studies: Dg Chest 2 View  11/17/2014   CLINICAL DATA:  Coughing and wheezing for 2 days.  Hypertension.  EXAM: CHEST  2 VIEW  COMPARISON:  06/13/2013  FINDINGS: There is moderate vascular and interstitial congestion. There is mild posterior base opacity which  may represent edema, but infectious infiltrate cannot be entirely excluded. No effusions are evident. There is mild cardiomegaly, unchanged.  IMPRESSION: Probable mild congestive heart failure.   Electronically Signed   By: Ellery Plunk M.D.   On: 11/17/2014 01:05    Scheduled Meds: . aspirin  325 mg Oral Daily  . furosemide  40 mg Intravenous BID  . heparin  5,000 Units Subcutaneous 3 times per day  . insulin aspart  0-15 Units Subcutaneous TID WC  . insulin aspart  5 Units Subcutaneous TID WC  . insulin glargine  30 Units Subcutaneous Daily  . isosorbide-hydrALAZINE  1 tablet Oral BID  . levofloxacin  (LEVAQUIN) IV  500 mg Intravenous Q24H  . methylPREDNISolone (SOLU-MEDROL) injection  60 mg Intravenous Q12H  . metoprolol succinate  50 mg Oral Daily  . sodium chloride  3 mL Intravenous Q12H  . sodium chloride  3 mL Intravenous Q12H   Continuous Infusions:    Time spent: > 35 minutes    Penny Pia  [facility] Pager [phone]. If 7PM-7AM, please contact night-coverage at [email or web address], password [facility] 11/18/2014, 1:30 PM  LOS: 1 day

## 2014-11-19 DIAGNOSIS — R079 Chest pain, unspecified: Secondary | ICD-10-CM

## 2014-11-19 DIAGNOSIS — N179 Acute kidney failure, unspecified: Secondary | ICD-10-CM

## 2014-11-19 DIAGNOSIS — R7989 Other specified abnormal findings of blood chemistry: Secondary | ICD-10-CM

## 2014-11-19 LAB — EXPECTORATED SPUTUM ASSESSMENT W GRAM STAIN, RFLX TO RESP C

## 2014-11-19 LAB — CBC WITH DIFFERENTIAL/PLATELET
BASOS ABS: 0 10*3/uL (ref 0.0–0.1)
Basophils Relative: 0 % (ref 0–1)
EOS PCT: 0 % (ref 0–5)
Eosinophils Absolute: 0 10*3/uL (ref 0.0–0.7)
HCT: 34.3 % — ABNORMAL LOW (ref 36.0–46.0)
HEMOGLOBIN: 11.5 g/dL — AB (ref 12.0–15.0)
LYMPHS ABS: 0.7 10*3/uL (ref 0.7–4.0)
Lymphocytes Relative: 5 % — ABNORMAL LOW (ref 12–46)
MCH: 30.9 pg (ref 26.0–34.0)
MCHC: 33.5 g/dL (ref 30.0–36.0)
MCV: 92.2 fL (ref 78.0–100.0)
MONO ABS: 0.4 10*3/uL (ref 0.1–1.0)
MONOS PCT: 3 % (ref 3–12)
NEUTROS PCT: 92 % — AB (ref 43–77)
Neutro Abs: 12 10*3/uL — ABNORMAL HIGH (ref 1.7–7.7)
Platelets: 255 10*3/uL (ref 150–400)
RBC: 3.72 MIL/uL — ABNORMAL LOW (ref 3.87–5.11)
RDW: 13.5 % (ref 11.5–15.5)
WBC: 13.1 10*3/uL — AB (ref 4.0–10.5)

## 2014-11-19 LAB — TROPONIN I
TROPONIN I: 0.06 ng/mL — AB (ref <0.031)
Troponin I: 0.06 ng/mL — ABNORMAL HIGH (ref <0.031)
Troponin I: 0.06 ng/mL — ABNORMAL HIGH (ref <0.031)

## 2014-11-19 LAB — GLUCOSE, CAPILLARY
GLUCOSE-CAPILLARY: 386 mg/dL — AB (ref 70–99)
Glucose-Capillary: 174 mg/dL — ABNORMAL HIGH (ref 70–99)
Glucose-Capillary: 191 mg/dL — ABNORMAL HIGH (ref 70–99)
Glucose-Capillary: 188 mg/dL — ABNORMAL HIGH (ref 70–99)

## 2014-11-19 LAB — COMPREHENSIVE METABOLIC PANEL
ALBUMIN: 2.9 g/dL — AB (ref 3.5–5.2)
ALK PHOS: 69 U/L (ref 39–117)
ALT: 37 U/L — ABNORMAL HIGH (ref 0–35)
AST: 33 U/L (ref 0–37)
Anion gap: 13 (ref 5–15)
BILIRUBIN TOTAL: 0.5 mg/dL (ref 0.3–1.2)
BUN: 75 mg/dL — AB (ref 6–23)
CO2: 23 mmol/L (ref 19–32)
Calcium: 8.7 mg/dL (ref 8.4–10.5)
Chloride: 100 mmol/L (ref 96–112)
Creatinine, Ser: 2.18 mg/dL — ABNORMAL HIGH (ref 0.50–1.10)
GFR calc non Af Amer: 18 mL/min — ABNORMAL LOW (ref >90)
GFR, EST AFRICAN AMERICAN: 21 mL/min — AB (ref >90)
GLUCOSE: 190 mg/dL — AB (ref 70–99)
Potassium: 3.6 mmol/L (ref 3.5–5.1)
Sodium: 136 mmol/L (ref 135–145)
TOTAL PROTEIN: 7 g/dL (ref 6.0–8.3)

## 2014-11-19 LAB — EXPECTORATED SPUTUM ASSESSMENT W REFEX TO RESP CULTURE

## 2014-11-19 MED ORDER — LEVOFLOXACIN IN D5W 250 MG/50ML IV SOLN
250.0000 mg | INTRAVENOUS | Status: DC
  Filled 2014-11-19: qty 50

## 2014-11-19 NOTE — Progress Notes (Signed)
[facility] PROGRESS NOTE  Cynthia Todd ZOX:[id number] DOB: 1918-10-02 DOA: 11/16/2014 PCP: Dorrene German, MD  Brief narrative: Patient is a 81 year old female with past medical history diastolic CHF, pulmonary hypertension, hypertension, IDDM. Presented to the hospital with hypertensive urgency and shortness of breath as well as elevated troponins.  Assessment/Plan:  Active Problems:   Acute respiratory failure - Multifactorial CHF (diastolic) and bronchitis - We'll continue current regimen as patient is improving on this regimen - Given elevation of serum creatinine I will hold Lasix    Hypertensive urgency - resolved - no new complaints regarding headaches, blurred vision, or abdominal pain.  Elevated troponin - In context of patient presents with acute diastolic CHF. Most likely demand ischemia. Patient not complaining of any chest discomfort - f/u with echocardiogram.  Code Status:  Full  Family Communication: discussed with family at bedside Disposition Plan: Discharge next a.m. with continued improvement  Consultants:  None  Procedures:  None  Antibiotics:  Levaquin  HPI/Subjective: Patient has no new complaints. No acute issues overnight  Objective: Filed Vitals:   11/19/14 0457  BP: 140/60  Pulse: 68  Temp: 97.8 F (36.6 C)  Resp: 18    Intake/Output Summary (Last 24 hours) at 11/19/14 1237 Last data filed at 11/19/14 0830  Gross per 24 hour  Intake    943 ml  Output      0 ml  Net    943 ml   Filed Weights   11/17/14 1619 11/18/14 0443 11/19/14 0457  Weight: 61 kg (134 lb 7.7 oz) 61.2 kg (134 lb 14.7 oz) 60.6 kg (133 lb 9.6 oz)    Exam:   General:  Patient in no acute distress. Alert and awake  Cardiovascular: Regular rate and rhythm, no murmurs or rubs  Respiratory: Clear to auscultation, decreased breath sounds at bases, equal chest rise  Abdomen: Soft, nondistended, nontender  Musculoskeletal: No cyanosis or clubbing. On  limited exam   Data Reviewed: Basic Metabolic Panel:  Recent Labs Lab 11/17/14 0016 11/17/14 0638 11/18/14 0456 11/19/14 0534  NA 133* 131* 132* 136  K 4.1 3.7 4.0 3.6  CL 101 99 98 100  CO2 GLUCOSE 172* 226* 230* 190*  BUN 30* 27* 49* 75*  CREATININE 1.30* 1.42* 1.85* 2.18*  CALCIUM 9.0 8.8 8.8 8.7   Liver Function Tests:  Recent Labs Lab 11/17/14 0016 11/17/14 0638 11/18/14 0456 11/19/14 0534  AST 43* 35 33 33  ALT 64* 56* 40* 37*  ALKPHOS 107 104 79 69  BILITOT 0.7 0.8 0.6 0.5  PROT 8.1 8.5* 7.3 7.0  ALBUMIN 3.6 3.6 3.2* 2.9*   No results for input(s): LIPASE, AMYLASE in the last 168 hours. No results for input(s): AMMONIA in the last 168 hours. CBC:  Recent Labs Lab 11/17/14 0016 11/17/14 0638 11/18/14 0456 11/19/14 0534  WBC 9.8 9.4 14.0* 13.1*  NEUTROABS 7.9* 8.7* 12.7* 12.0*  HGB 12.5 12.6 11.7* 11.5*  HCT 36.6 37.0 34.1* 34.3*  MCV 91.7 91.6 91.4 92.2  PLT 248 245 226 255   Cardiac Enzymes:  Recent Labs Lab 11/17/14 0016 11/17/14 0336 11/17/14 0915 11/17/14 1429 11/19/14 1057  TROPONINI 0.06* 0.06* 0.05* 0.04* 0.06*   BNP (last 3 results)  Recent Labs  11/17/14 0016  BNP 1531.8*    ProBNP (last 3 results) No results for input(s): PROBNP in the last 8760 hours.  CBG:  Recent Labs Lab 11/18/14 0757 11/18/14 1154 11/18/14 1722 11/18/14 2217 11/19/14 0724  GLUCAP  215* 248* 194* 183* 174*    Recent Results (from the past 240 hour(s))  Culture, expectorated sputum-assessment     Status: None   Collection Time: 11/19/14 11:36 AM  Result Value Ref Range Status   Specimen Description SPUTUM  Final   Special Requests NONE  Final   Sputum evaluation   Final    MICROSCOPIC FINDINGS SUGGEST THAT THIS SPECIMEN IS NOT REPRESENTATIVE OF LOWER RESPIRATORY SECRETIONS. PLEASE RECOLLECT. J BURNS AT 1210 ON 03.20.2016 BY NBROOKS    Report Status 11/19/2014 FINAL  Final     Studies: No results found.  Scheduled  Meds: . aspirin  325 mg Oral Daily  . heparin  5,000 Units Subcutaneous 3 times per day  . insulin aspart  0-15 Units Subcutaneous TID WC  . insulin aspart  5 Units Subcutaneous TID WC  . insulin glargine  30 Units Subcutaneous Daily  . isosorbide-hydrALAZINE  1 tablet Oral BID  . levofloxacin (LEVAQUIN) IV  500 mg Intravenous Q24H  . methylPREDNISolone (SOLU-MEDROL) injection  60 mg Intravenous Q12H  . metoprolol succinate  50 mg Oral Daily  . sodium chloride  3 mL Intravenous Q12H  . sodium chloride  3 mL Intravenous Q12H   Continuous Infusions:    Time spent: > 35 minutes    Penny PiaVEGA, Maritza Goldsborough  [facility] Pager [phone]. If 7PM-7AM, please contact night-coverage at [email or web address], password [email or web address] 11/19/2014, 12:37 PM  LOS: 2 days

## 2014-11-19 NOTE — Progress Notes (Signed)
UR Completed.  [phone]  

## 2014-11-19 NOTE — Progress Notes (Signed)
  Echocardiogram 2D Echocardiogram has been performed.  Cynthia EvertsRix, Cynthia Todd A 11/19/2014, 11:02 AM

## 2014-11-20 LAB — CBC WITH DIFFERENTIAL/PLATELET
BASOS PCT: 0 % (ref 0–1)
Basophils Absolute: 0 10*3/uL (ref 0.0–0.1)
EOS ABS: 0 10*3/uL (ref 0.0–0.7)
EOS PCT: 0 % (ref 0–5)
HCT: 37.4 % (ref 36.0–46.0)
Hemoglobin: 12.5 g/dL (ref 12.0–15.0)
LYMPHS ABS: 0.7 10*3/uL (ref 0.7–4.0)
Lymphocytes Relative: 6 % — ABNORMAL LOW (ref 12–46)
MCH: 30.5 pg (ref 26.0–34.0)
MCHC: 33.4 g/dL (ref 30.0–36.0)
MCV: 91.2 fL (ref 78.0–100.0)
MONOS PCT: 2 % — AB (ref 3–12)
Monocytes Absolute: 0.3 10*3/uL (ref 0.1–1.0)
Neutro Abs: 11.3 10*3/uL — ABNORMAL HIGH (ref 1.7–7.7)
Neutrophils Relative %: 92 % — ABNORMAL HIGH (ref 43–77)
Platelets: 264 10*3/uL (ref 150–400)
RBC: 4.1 MIL/uL (ref 3.87–5.11)
RDW: 13.3 % (ref 11.5–15.5)
WBC: 12.3 10*3/uL — ABNORMAL HIGH (ref 4.0–10.5)

## 2014-11-20 LAB — COMPREHENSIVE METABOLIC PANEL
ALK PHOS: 68 U/L (ref 39–117)
ALT: 35 U/L (ref 0–35)
ANION GAP: 13 (ref 5–15)
AST: 26 U/L (ref 0–37)
Albumin: 3.1 g/dL — ABNORMAL LOW (ref 3.5–5.2)
BUN: 76 mg/dL — ABNORMAL HIGH (ref 6–23)
CALCIUM: 8.3 mg/dL — AB (ref 8.4–10.5)
CHLORIDE: 98 mmol/L (ref 96–112)
CO2: 21 mmol/L (ref 19–32)
CREATININE: 2.12 mg/dL — AB (ref 0.50–1.10)
GFR, EST AFRICAN AMERICAN: 22 mL/min — AB (ref >90)
GFR, EST NON AFRICAN AMERICAN: 19 mL/min — AB (ref >90)
Glucose, Bld: 178 mg/dL — ABNORMAL HIGH (ref 70–99)
POTASSIUM: 3.6 mmol/L (ref 3.5–5.1)
SODIUM: 132 mmol/L — AB (ref 135–145)
Total Bilirubin: 0.5 mg/dL (ref 0.3–1.2)
Total Protein: 7.3 g/dL (ref 6.0–8.3)

## 2014-11-20 LAB — GLUCOSE, CAPILLARY
GLUCOSE-CAPILLARY: 156 mg/dL — AB (ref 70–99)
Glucose-Capillary: 200 mg/dL — ABNORMAL HIGH (ref 70–99)

## 2014-11-20 MED ORDER — CEFDINIR 300 MG PO CAPS: 300.0000 mg | ORAL_CAPSULE | Freq: Every day | ORAL | Status: DC

## 2014-11-20 MED ORDER — ASPIRIN 81 MG PO TABS: 81.0000 mg | ORAL_TABLET | Freq: Every day | ORAL | Status: DC

## 2014-11-20 MED ORDER — INSULIN GLARGINE 100 UNIT/ML ~~LOC~~ SOLN: 25.0000 [IU] | Freq: Every day | SUBCUTANEOUS | Status: DC

## 2014-11-20 NOTE — Care Management Note (Signed)
    Page 1 of 1   11/20/2014     2:20:32 PM CARE MANAGEMENT NOTE 11/20/2014  Patient:  Cynthia Todd,Cynthia Todd   Account Number:  [id number]  Date Initiated:  11/20/2014  Documentation initiated by:  Lanier ClamMAHABIR,Mccauley Diehl  Subjective/Objective Assessment:   81 y/o f admitted w/resp failure.     Action/Plan:   From home w/family.   Anticipated DC Date:  11/20/2014   Anticipated DC Plan:  HOME/SELF CARE      DC Planning Services  CM consult      Choice offered to / List presented to:             Status of service:  Completed, signed off Medicare Important Message given?   (If response is "NO", the following Medicare IM given date fields will be blank) Date Medicare IM given:   Medicare IM given by:   Date Additional Medicare IM given:   Additional Medicare IM given by:    Discharge Disposition:  HOME/SELF CARE  Per UR Regulation:  Reviewed for med. necessity/level of care/duration of stay  If discussed at Long Length of Stay Meetings, dates discussed:    Comments:  11/20/14 Lanier ClamKathy Keerthi Hazell RN BSN NCM [phone] Dtr in rm able to speak [place].No d/c needs or orders.

## 2014-11-20 NOTE — Clinical Documentation Improvement (Signed)
Patient has received IV diuretics this admission for acute diastolic heart failure. Patient's creatinine increased 0.88 mg/dL in 48 hours (41/32/4403/18/16 to 11/19/14) BUN/Cr/GFR this admission Component     Latest Ref Rng 11/17/2014 11/17/2014 11/18/2014 11/19/2014        12:16 AM  6:38 AM    BUN     6 - 23 mg/dL 30 (H) 27 (H) 49 (H) 75 (H)  Creatinine     0.50 - 1.10 mg/dL 0.101.30 (H) 2.721.42 (H) 5.361.85 (H) 2.18 (H)  GFR calc non Af Amer     >90 mL/min 34 (L) 31 (L) 22 (L) 18 (L)   Component     Latest Ref Rng 11/20/2014  BUN     6 - 23 mg/dL 76 (H)  Creatinine     0.50 - 1.10 mg/dL 6.442.12 (H)  GFR calc non Af Amer     >90 mL/min 19 (L)   Possible Clinical Conditions:  - Acute Kidney Injury 2/2 ............................................  - Other Condition  - Unable to Clinically Determine  Thank You, Jerral Ralphathy R Emiline Mancebo ,RN Clinical Documentation Specialist:  [phone] [facility] Health Information Management

## 2014-11-20 NOTE — Discharge Summary (Signed)
Physician Discharge Summary  Cynthia Todd ZOX:[id number] DOB: Jan 12, 1919 DOA: 11/16/2014  PCP: Dorrene German, MD  Admit date: 11/16/2014 Discharge date: 11/20/2014  Time spent: > 35 minutes  Recommendations for Outpatient Follow-up:  1.  Patient's serum creatinine elevated after lasix administration. Will hold lasix on discharge 2. Treat with oral antibiotic for 3 more days on discharge to complete a total of 7 days of treatment 3. Will hold ARB with elevated S creatinine 4. Hold glipizide and decreased lantus dose due to elevated serum creatinine  Discharge Diagnoses:  Active Problems:   Acute respiratory failure   Hypertensive urgency   Discharge Condition: stable  Diet recommendation: diabetic diet/heart healthy  Filed Weights   11/17/14 1619 11/18/14 0443 11/19/14 0457  Weight: 61 kg (134 lb 7.7 oz) 61.2 kg (134 lb 14.7 oz) 60.6 kg (133 lb 9.6 oz)    History of present illness:  From original HPI: 81 y.o. year old female with significant past medical history of diastolic CHF, pulm HTN, HTN, IDDM presenting with acute resp failure, hypertensive urgency. Family report pt w/ increased cough, SOB, wheezing over past 1-2 days. No fevers or chills. Per grandson, pt ran out of one her BP meds earlier in the week. Cough has progressively gotten worse.  Hospital Course:  Acute respiratory failure - Improved with lasix and antibiotics - Will not continue prednisone as patient does not have wheezing and I would like to avoid increasing the change of patient becoming fluid overloaded given her history of diastolic CHF - continue omnicef for 3 more days on d/c to complete a total course of 7 days of antibiotic treatment  Elevated S creatinine - hold nephrotoxic agents on discharge ARB and lasix - Should improve with continued improvement in oral intake. Nursing reports that her po intake has been good  DM - hold glipizide while elevated s creatinine - continue lantus at lower dose  25 Units SQ daily  Elevated troponin - Pt did not complain of chest pain - no regional wall motion abnormality on echocardiogram  Procedures:  Echocardiogram  Consultations:  None  Discharge Exam: Filed Vitals:   11/20/14 0605  BP: 163/59  Pulse: 59  Temp: 97.9 F (36.6 C)  Resp: 18    General: pt in nad, alert and awake, smiling at times Cardiovascular: rrr, no rubs Respiratory: cta bl, no wheezes, breathing comfortably on room air.  Discharge Instructions   Discharge Instructions    Call MD for:  difficulty breathing, headache or visual disturbances    Complete by:  As directed      Call MD for:  extreme fatigue    Complete by:  As directed      Call MD for:  persistant nausea and vomiting    Complete by:  As directed      Call MD for:  severe uncontrolled pain    Complete by:  As directed      Call MD for:  temperature >100.4    Complete by:  As directed      Diet - low sodium heart healthy    Complete by:  As directed      Increase activity slowly    Complete by:  As directed           Current Discharge Medication List    START taking these medications   Details  aspirin 81 MG tablet Take 1 tablet (81 mg total) by mouth daily. Qty: 30 tablet, Refills: 0    cefdinir (OMNICEF)  300 MG capsule Take 1 capsule (300 mg total) by mouth daily. Qty: 3 capsule, Refills: 0      CONTINUE these medications which have CHANGED   Details  insulin glargine (LANTUS) 100 UNIT/ML injection Inject 0.25 mLs (25 Units total) into the skin daily. Qty: 10 mL, Refills: 12      CONTINUE these medications which have NOT CHANGED   Details  albuterol (PROVENTIL HFA;VENTOLIN HFA) 108 (90 BASE) MCG/ACT inhaler Inhale 2 puffs into the lungs every 6 (six) hours as needed for wheezing or shortness of breath.    cetirizine (ZYRTEC) 10 MG tablet Take 10 mg by mouth daily as needed for allergies.    Diclofenac Sodium 1.5 % SOLN Place 40 each onto the skin as needed (pain).     isosorbide-hydrALAZINE (BIDIL) 20-37.5 MG per tablet Take 1 tablet by mouth 2 (two) times daily.    metoprolol succinate (TOPROL-XL) 50 MG 24 hr tablet Take 50 mg by mouth daily. Take with or immediately following a meal.      STOP taking these medications     glimepiride (AMARYL) 4 MG tablet      losartan (COZAAR) 100 MG tablet      furosemide (LASIX) 40 MG tablet      hydrALAZINE (APRESOLINE) 50 MG tablet      isosorbide mononitrate (IMDUR) 30 MG 24 hr tablet      metoprolol (LOPRESSOR) 50 MG tablet        No Known Allergies    The results of significant diagnostics from this hospitalization (including imaging, microbiology, ancillary and laboratory) are listed below for reference.    Significant Diagnostic Studies: Dg Chest 2 View  11/17/2014   CLINICAL DATA:  Coughing and wheezing for 2 days.  Hypertension.  EXAM: CHEST  2 VIEW  COMPARISON:  06/13/2013  FINDINGS: There is moderate vascular and interstitial congestion. There is mild posterior base opacity which may represent edema, but infectious infiltrate cannot be entirely excluded. No effusions are evident. There is mild cardiomegaly, unchanged.  IMPRESSION: Probable mild congestive heart failure.   Electronically Signed   By: Ellery Plunkaniel R Mitchell M.D.   On: 11/17/2014 01:05    Microbiology: Recent Results (from the past 240 hour(s))  Culture, expectorated sputum-assessment     Status: None   Collection Time: 11/19/14 11:36 AM  Result Value Ref Range Status   Specimen Description SPUTUM  Final   Special Requests NONE  Final   Sputum evaluation   Final    MICROSCOPIC FINDINGS SUGGEST THAT THIS SPECIMEN IS NOT REPRESENTATIVE OF LOWER RESPIRATORY SECRETIONS. PLEASE RECOLLECT. J BURNS AT 1210 ON 03.20.2016 BY NBROOKS    Report Status 11/19/2014 FINAL  Final     Labs: Basic Metabolic Panel:  Recent Labs Lab 11/17/14 0016 11/17/14 0638 11/18/14 0456 11/19/14 0534 11/20/14 0514  NA 133* 131* 132* 136 132*  K  4.1 3.7 4.0 3.6 3.6  CL 101 99 98 100 98  CO2 21 23 21 23 21   GLUCOSE 172* 226* 230* 190* 178*  BUN 30* 27* 49* 75* 76*  CREATININE 1.30* 1.42* 1.85* 2.18* 2.12*  CALCIUM 9.0 8.8 8.8 8.7 8.3*   Liver Function Tests:  Recent Labs Lab 11/17/14 0016 11/17/14 0638 11/18/14 0456 11/19/14 0534 11/20/14 0514  AST 43* 35 33 33 26  ALT 64* 56* 40* 37* 35  ALKPHOS 107 104 79 69 68  BILITOT 0.7 0.8 0.6 0.5 0.5  PROT 8.1 8.5* 7.3 7.0 7.3  ALBUMIN 3.6 3.6 3.2* 2.9* 3.1*  No results for input(s): LIPASE, AMYLASE in the last 168 hours. No results for input(s): AMMONIA in the last 168 hours. CBC:  Recent Labs Lab 11/17/14 0016 11/17/14 1478 11/18/14 0456 11/19/14 0534 11/20/14 0514  WBC 9.8 9.4 14.0* 13.1* 12.3*  NEUTROABS 7.9* 8.7* 12.7* 12.0* 11.3*  HGB 12.5 12.6 11.7* 11.5* 12.5  HCT 36.6 37.0 34.1* 34.3* 37.4  MCV 91.7 91.6 91.4 92.2 91.2  PLT 248 245 226 255 264   Cardiac Enzymes:  Recent Labs Lab 11/17/14 0915 11/17/14 1429 11/19/14 1057 11/19/14 1612 11/19/14 2205  TROPONINI 0.05* 0.04* 0.06* 0.06* 0.06*   BNP: BNP (last 3 results)  Recent Labs  11/17/14 0016  BNP 1531.8*    ProBNP (last 3 results) No results for input(s): PROBNP in the last 8760 hours.  CBG:  Recent Labs Lab 11/19/14 0724 11/19/14 1201 11/19/14 1637 11/19/14 2215 11/20/14 0804  GLUCAP 174* 386* 191* 188* 156*     Signed:  Penny Pia  [facility] 11/20/2014, 10:42 AM

## 2016-06-19 ENCOUNTER — Emergency Department (HOSPITAL_COMMUNITY): Payer: Medicare Other

## 2016-06-19 ENCOUNTER — Observation Stay (HOSPITAL_COMMUNITY)
Admission: EM | Admit: 2016-06-19 | Discharge: 2016-06-20 | Disposition: A | Discharge status: Home / Self Care | Payer: Medicare Other | Attending: Internal Medicine | Admitting: Internal Medicine

## 2016-06-19 ENCOUNTER — Encounter (HOSPITAL_COMMUNITY): Payer: Self-pay | Admitting: Radiology

## 2016-06-19 DIAGNOSIS — I700 Atherosclerosis of aorta: Secondary | ICD-10-CM | POA: Insufficient documentation

## 2016-06-19 DIAGNOSIS — R410 Disorientation, unspecified: Secondary | ICD-10-CM | POA: Insufficient documentation

## 2016-06-19 DIAGNOSIS — Z7982 Long term (current) use of aspirin: Secondary | ICD-10-CM | POA: Insufficient documentation

## 2016-06-19 DIAGNOSIS — I160 Hypertensive urgency: Secondary | ICD-10-CM | POA: Insufficient documentation

## 2016-06-19 DIAGNOSIS — R55 Syncope and collapse: Principal | ICD-10-CM | POA: Insufficient documentation

## 2016-06-19 DIAGNOSIS — I5032 Chronic diastolic (congestive) heart failure: Secondary | ICD-10-CM | POA: Diagnosis not present

## 2016-06-19 DIAGNOSIS — Z794 Long term (current) use of insulin: Secondary | ICD-10-CM | POA: Diagnosis not present

## 2016-06-19 DIAGNOSIS — N183 Chronic kidney disease, stage 3 unspecified: Secondary | ICD-10-CM | POA: Diagnosis present

## 2016-06-19 DIAGNOSIS — I130 Hypertensive heart and chronic kidney disease with heart failure and stage 1 through stage 4 chronic kidney disease, or unspecified chronic kidney disease: Secondary | ICD-10-CM | POA: Insufficient documentation

## 2016-06-19 DIAGNOSIS — I6523 Occlusion and stenosis of bilateral carotid arteries: Secondary | ICD-10-CM | POA: Diagnosis not present

## 2016-06-19 DIAGNOSIS — K219 Gastro-esophageal reflux disease without esophagitis: Secondary | ICD-10-CM | POA: Diagnosis not present

## 2016-06-19 DIAGNOSIS — I10 Essential (primary) hypertension: Secondary | ICD-10-CM

## 2016-06-19 DIAGNOSIS — E1122 Type 2 diabetes mellitus with diabetic chronic kidney disease: Secondary | ICD-10-CM | POA: Diagnosis not present

## 2016-06-19 DIAGNOSIS — IMO0001 Reserved for inherently not codable concepts without codable children: Secondary | ICD-10-CM

## 2016-06-19 DIAGNOSIS — I083 Combined rheumatic disorders of mitral, aortic and tricuspid valves: Secondary | ICD-10-CM | POA: Insufficient documentation

## 2016-06-19 DIAGNOSIS — E119 Type 2 diabetes mellitus without complications: Secondary | ICD-10-CM

## 2016-06-19 HISTORY — DX: Chronic kidney disease, stage 3 (moderate): N18.3

## 2016-06-19 HISTORY — DX: Gastro-esophageal reflux disease without esophagitis: K21.9

## 2016-06-19 HISTORY — DX: Chronic kidney disease, stage 3 unspecified: N18.30

## 2016-06-19 LAB — URINALYSIS, ROUTINE W REFLEX MICROSCOPIC
BILIRUBIN URINE: NEGATIVE
Glucose, UA: NEGATIVE mg/dL
Hgb urine dipstick: NEGATIVE
Ketones, ur: NEGATIVE mg/dL
Leukocytes, UA: NEGATIVE
NITRITE: NEGATIVE
PROTEIN: 100 mg/dL — AB
Specific Gravity, Urine: 1.015 (ref 1.005–1.030)
pH: 6.5 (ref 5.0–8.0)

## 2016-06-19 LAB — MAGNESIUM: Magnesium: 2 mg/dL (ref 1.7–2.4)

## 2016-06-19 LAB — RAPID URINE DRUG SCREEN, HOSP PERFORMED
Amphetamines: NOT DETECTED
BARBITURATES: NOT DETECTED
Benzodiazepines: NOT DETECTED
Cocaine: NOT DETECTED
Opiates: NOT DETECTED
Tetrahydrocannabinol: NOT DETECTED

## 2016-06-19 LAB — HEPATIC FUNCTION PANEL
ALT: 20 U/L (ref 14–54)
AST: 28 U/L (ref 15–41)
Albumin: 3.4 g/dL — ABNORMAL LOW (ref 3.5–5.0)
Alkaline Phosphatase: 70 U/L (ref 38–126)
BILIRUBIN TOTAL: 0.7 mg/dL (ref 0.3–1.2)
Bilirubin, Direct: <0.1 mg/dL — ABNORMAL LOW (ref 0.1–0.5)
Total Protein: 7.4 g/dL (ref 6.5–8.1)

## 2016-06-19 LAB — DIFFERENTIAL
BASOS PCT: 1 %
Basophils Absolute: 0.1 10*3/uL (ref 0.0–0.1)
EOS ABS: 0.2 10*3/uL (ref 0.0–0.7)
EOS PCT: 5 %
Lymphocytes Relative: 26 %
Lymphs Abs: 1 10*3/uL (ref 0.7–4.0)
Monocytes Absolute: 0.3 10*3/uL (ref 0.1–1.0)
Monocytes Relative: 8 %
NEUTROS PCT: 60 %
Neutro Abs: 2.2 10*3/uL (ref 1.7–7.7)

## 2016-06-19 LAB — CBC
HCT: 38.1 % (ref 36.0–46.0)
Hemoglobin: 13.2 g/dL (ref 12.0–15.0)
MCH: 31.7 pg (ref 26.0–34.0)
MCHC: 34.6 g/dL (ref 30.0–36.0)
MCV: 91.6 fL (ref 78.0–100.0)
Platelets: 189 10*3/uL (ref 150–400)
RBC: 4.16 MIL/uL (ref 3.87–5.11)
RDW: 13 % (ref 11.5–15.5)
WBC: 3.8 10*3/uL — ABNORMAL LOW (ref 4.0–10.5)

## 2016-06-19 LAB — BASIC METABOLIC PANEL
Anion gap: 8 (ref 5–15)
BUN: 24 mg/dL — ABNORMAL HIGH (ref 6–20)
CALCIUM: 8.8 mg/dL — AB (ref 8.9–10.3)
CO2: 26 mmol/L (ref 22–32)
CREATININE: 1.48 mg/dL — AB (ref 0.44–1.00)
Chloride: 98 mmol/L — ABNORMAL LOW (ref 101–111)
GFR, EST AFRICAN AMERICAN: 33 mL/min — AB (ref >60)
GFR, EST NON AFRICAN AMERICAN: 29 mL/min — AB (ref >60)
Glucose, Bld: 235 mg/dL — ABNORMAL HIGH (ref 65–99)
Potassium: 4.1 mmol/L (ref 3.5–5.1)
Sodium: 132 mmol/L — ABNORMAL LOW (ref 135–145)

## 2016-06-19 LAB — BRAIN NATRIURETIC PEPTIDE: B Natriuretic Peptide: 974.4 pg/mL — ABNORMAL HIGH (ref 0.0–100.0)

## 2016-06-19 LAB — I-STAT TROPONIN, ED: Troponin i, poc: 0.01 ng/mL (ref 0.00–0.08)

## 2016-06-19 LAB — CBG MONITORING, ED
GLUCOSE-CAPILLARY: 164 mg/dL — AB (ref 65–99)
GLUCOSE-CAPILLARY: 153 mg/dL — AB (ref 65–99)

## 2016-06-19 LAB — TSH: TSH: 3.998 u[IU]/mL (ref 0.350–4.500)

## 2016-06-19 LAB — URINE MICROSCOPIC-ADD ON

## 2016-06-19 LAB — TROPONIN I: TROPONIN I: 0.04 ng/mL — AB (ref <0.03)

## 2016-06-19 LAB — PHOSPHORUS: PHOSPHORUS: 3.8 mg/dL (ref 2.5–4.6)

## 2016-06-19 LAB — GLUCOSE, CAPILLARY: Glucose-Capillary: 154 mg/dL — ABNORMAL HIGH (ref 65–99)

## 2016-06-19 MED ORDER — ONDANSETRON HCL 4 MG PO TABS: 4.0000 mg | ORAL_TABLET | Freq: Four times a day (QID) | ORAL | Status: DC | PRN

## 2016-06-19 MED ORDER — ISOSORB DINITRATE-HYDRALAZINE 20-37.5 MG PO TABS: 1.0000 | ORAL_TABLET | Freq: Every day | ORAL | Status: DC

## 2016-06-19 MED ORDER — ORAL CARE MOUTH RINSE: 15.0000 mL | Freq: Two times a day (BID) | OROMUCOSAL | Status: DC

## 2016-06-19 MED ORDER — ACETAMINOPHEN 650 MG RE SUPP: 650.0000 mg | Freq: Four times a day (QID) | RECTAL | Status: DC | PRN

## 2016-06-19 MED ORDER — SENNOSIDES-DOCUSATE SODIUM 8.6-50 MG PO TABS: 1.0000 | ORAL_TABLET | Freq: Every evening | ORAL | Status: DC | PRN

## 2016-06-19 MED ORDER — SIMETHICONE 80 MG PO CHEW
80.0000 mg | CHEWABLE_TABLET | Freq: Four times a day (QID) | ORAL | Status: DC
  Administered 2016-06-19 – 2016-06-20 (×2): 80 mg via ORAL
  Filled 2016-06-19 (×2): qty 1

## 2016-06-19 MED ORDER — SODIUM CHLORIDE 0.9 % IV SOLN
INTRAVENOUS | Status: DC
  Administered 2016-06-19: 19:00:00 via INTRAVENOUS

## 2016-06-19 MED ORDER — ASPIRIN EC 81 MG PO TBEC: 81.0000 mg | DELAYED_RELEASE_TABLET | Freq: Every day | ORAL | Status: DC | PRN

## 2016-06-19 MED ORDER — SODIUM CHLORIDE 0.9 % IV BOLUS (SEPSIS)
1000.0000 mL | Freq: Once | INTRAVENOUS | Status: AC
Start: 2016-06-19 — End: 2016-06-19
  Administered 2016-06-19: 1000 mL via INTRAVENOUS

## 2016-06-19 MED ORDER — ENOXAPARIN SODIUM 30 MG/0.3ML ~~LOC~~ SOLN
30.0000 mg | SUBCUTANEOUS | Status: DC
  Administered 2016-06-19: 30 mg via SUBCUTANEOUS
  Filled 2016-06-19: qty 0.3

## 2016-06-19 MED ORDER — ACETAMINOPHEN 325 MG PO TABS: 650.0000 mg | ORAL_TABLET | Freq: Four times a day (QID) | ORAL | Status: DC | PRN

## 2016-06-19 MED ORDER — ALUM & MAG HYDROXIDE-SIMETH 200-200-20 MG/5ML PO SUSP: 30.0000 mL | Freq: Four times a day (QID) | ORAL | Status: DC | PRN

## 2016-06-19 MED ORDER — SORBITOL 70 % SOLN: 30.0000 mL | Freq: Every day | Status: DC | PRN

## 2016-06-19 MED ORDER — METOPROLOL SUCCINATE ER 50 MG PO TB24
50.0000 mg | ORAL_TABLET | Freq: Every day | ORAL | Status: DC
  Administered 2016-06-20: 50 mg via ORAL
  Filled 2016-06-19: qty 1

## 2016-06-19 MED ORDER — HYPROMELLOSE (GONIOSCOPIC) 2.5 % OP SOLN: 3.0000 [drp] | Freq: Three times a day (TID) | OPHTHALMIC | Status: DC

## 2016-06-19 MED ORDER — ONDANSETRON HCL 4 MG/2ML IJ SOLN: 4.0000 mg | Freq: Four times a day (QID) | INTRAMUSCULAR | Status: DC | PRN

## 2016-06-19 MED ORDER — HYDRALAZINE HCL 20 MG/ML IJ SOLN
5.0000 mg | Freq: Four times a day (QID) | INTRAMUSCULAR | Status: DC | PRN
  Administered 2016-06-19: 5 mg via INTRAVENOUS
  Filled 2016-06-19: qty 1

## 2016-06-19 MED ORDER — PANTOPRAZOLE SODIUM 40 MG PO TBEC
40.0000 mg | DELAYED_RELEASE_TABLET | Freq: Every day | ORAL | Status: DC
  Administered 2016-06-20: 40 mg via ORAL
  Filled 2016-06-19: qty 1

## 2016-06-19 MED ORDER — CHLORHEXIDINE GLUCONATE 0.12 % MT SOLN
15.0000 mL | Freq: Two times a day (BID) | OROMUCOSAL | Status: DC
  Administered 2016-06-19 – 2016-06-20 (×2): 15 mL via OROMUCOSAL
  Filled 2016-06-19 (×2): qty 15

## 2016-06-19 MED ORDER — TRAMADOL HCL 50 MG PO TABS: 50.0000 mg | ORAL_TABLET | Freq: Four times a day (QID) | ORAL | Status: DC | PRN

## 2016-06-19 MED ORDER — SODIUM CHLORIDE 0.9% FLUSH
3.0000 mL | Freq: Two times a day (BID) | INTRAVENOUS | Status: DC
  Administered 2016-06-19: 3 mL via INTRAVENOUS

## 2016-06-19 MED ORDER — INSULIN ASPART 100 UNIT/ML ~~LOC~~ SOLN
0.0000 [IU] | Freq: Three times a day (TID) | SUBCUTANEOUS | Status: DC
Start: 2016-06-20 — End: 2016-06-20
  Administered 2016-06-20: 2 [IU] via SUBCUTANEOUS

## 2016-06-19 MED ORDER — POLYVINYL ALCOHOL 1.4 % OP SOLN
3.0000 [drp] | OPHTHALMIC | Status: DC | PRN
  Filled 2016-06-19: qty 15

## 2016-06-19 NOTE — Progress Notes (Signed)
Pt wanted IV d/c'd and does not want a new IV until the am. Will continue to monitor closely. Mardene CelesteAsaro, Makylah Bossard I

## 2016-06-19 NOTE — ED Notes (Signed)
Pt made multiple attempts to give urine sample using bed pan earlier today. This RN as well as Cammy CopaAbigail, RN and Arlys JohnBrian NT attempted to In and Out cath pt. Twice. Unsuccessful each time. Pt clamped down muscles making it impossible to pass catheter. MD made aware. Pt to drink fluid in attempt to get her to use bedpan later.

## 2016-06-19 NOTE — ED Notes (Signed)
Pt made aware of need for urine sample. Pt placed on bedpan for second time. Pt made aware that we will have to in and out cath if unable to get a sample.

## 2016-06-19 NOTE — ED Triage Notes (Signed)
Pt presents via EMS. Per EMS, she was nauseated and dry heaving.  Per the family member, patient was last known well at 1000.  At that time she was walking and stated that she couldn't walk anymore. Family member states that patient seemed to pass out and was not very responsive for 10-15 minutes.  Pt is moaning but denies pain when asked.  BP: 167/66 HR: 58 RR: 22 98% RA CBG: 185

## 2016-06-19 NOTE — Progress Notes (Signed)
CRITICAL VALUE ALERT  Critical value received:  Troponin 0.04  Date of notification:  06/19/2016  Time of notification:  1925  Critical value read back:Yes.    Nurse who received alert:  Jerrel IvorySandra Gagliano, RN  MD notified (1st page):  Merdis DelayK. Schorr, NP  Time of first page:  [phone]

## 2016-06-19 NOTE — ED Notes (Signed)
Bed: [id number] Expected date:  Expected time:  Means of arrival:  Comments: [age] yo F, n/v

## 2016-06-19 NOTE — ED Provider Notes (Signed)
[facility]-EMERGENCY DEPT Provider Note   CSN: [id number] Arrival date & time: 06/19/16  1210     History   Chief Complaint Chief Complaint  Patient presents with  . Near Syncope  . Dizziness    HPI Cynthia Todd is a 82 y.o. female.  HPI 82 year old female who presents with a syncopal episode and confusion. History is provided by the patient's daughter. Level 5 caveat due to AMS. States yesterday evening, patient with some visual hallucinations, thinking people have been in the house with her when they were not. This morning in her usual state of health. Ate breakfast and took a walk. During walk, patient state that she felt very unwell and could not walk further. Another family brought out of chair, arthritis she was sitting down, she seemed to pass out. Her eyes rolled back. There is no seizure activity, tongue biting or urinary incontinence. They were unable to get her to respond for 10-15 minutes. States that they try to give her water, but it would leak out of her mouth. When she came to, she was very confused, disoriented, not answering questions appropriately. EMS was called and per EMS patient initially dry heaving and nauseated.  Currently family states that she is disoriented. Denies any pain.   Past Medical History:  Diagnosis Date  . Diabetes mellitus   . Hypertension     Patient Active Problem List   Diagnosis Date Noted  . Syncope 06/19/2016  . Hypertensive urgency 11/17/2014  . Diabetic nephropathy (HCC) 06/15/2013  . Acute respiratory failure (HCC) 06/15/2013  . Insulin dependent diabetes mellitus (HCC) 06/14/2013  . Acute on chronic diastolic CHF (congestive heart failure) (HCC) 06/13/2013  . Influenza 10/02/2011  . Diastolic heart failure, NYHA class 1 (HCC) 09/29/2011  . Pulmonary HTN 09/29/2011  . Shortness of breath 09/28/2011  . Hypokalemia 09/28/2011  . Chest pain on breathing 09/28/2011  . Diabetes mellitus 09/28/2011  . Malignant hypertension  09/28/2011    No past surgical history on file.  OB History    No data available       Home Medications    Prior to Admission medications   Medication Sig Start Date End Date Taking? Authorizing Provider  aspirin 81 MG tablet Take 1 tablet (81 mg total) by mouth daily. Patient taking differently: Take 81 mg by mouth daily as needed. Does not take daily - Takes as needed for pain 11/20/14  Yes Penny Pia, MD  glimepiride (AMARYL) 4 MG tablet Take 4 mg by mouth daily. 05/13/16  Yes Historical Provider, MD  hydroxypropyl methylcellulose / hypromellose (ISOPTO TEARS / GONIOVISC) 2.5 % ophthalmic solution Place 3 drops into both eyes 3 (three) times daily. For dry eyes   Yes Historical Provider, MD  insulin glargine (LANTUS) 100 UNIT/ML injection Inject 0.25 mLs (25 Units total) into the skin daily. Patient taking differently: Inject 25 Units into the skin daily. Only given insulin if sugar is "bad" - not given daily 11/20/14  Yes Penny Pia, MD  isosorbide-hydrALAZINE (BIDIL) 20-37.5 MG per tablet Take 1 tablet by mouth daily. Takes at least once daily. Only sometimes takes twice daily   Yes Historical Provider, MD  metoprolol succinate (TOPROL-XL) 50 MG 24 hr tablet Take 50 mg by mouth daily. Take with or immediately following a meal.   Yes Historical Provider, MD  omeprazole (PRILOSEC) 20 MG capsule Take 20 mg by mouth 2 (two) times daily. 04/15/16  Yes Historical Provider, MD  cefdinir (OMNICEF) 300 MG capsule Take 1 capsule (  300 mg total) by mouth daily. Patient not taking: Reported on 06/19/2016 11/20/14   Penny Piarlando Vega, MD    Family History No family history on file.  Social History Social History  Substance Use Topics  . Smoking status: Never Smoker  . Smokeless tobacco: Not on file  . Alcohol use No     Allergies   Review of patient's allergies indicates not on file.   Review of Systems Review of Systems 10/14 systems reviewed and are negative other than those stated  in the HPI   Physical Exam Updated Vital Signs BP 159/71 (BP Location: Left Arm)   Pulse (!) 56   Temp 97.6 F (36.4 C) (Oral)   Resp 18   SpO2 100%   Physical Exam Physical Exam  Nursing note and vitals reviewed. Constitutional: non-toxic, and in no acute distress Head: Normocephalic and atraumatic.  Mouth/Throat: Oropharynx is clear and moist.  Neck: Normal range of motion. Neck supple.  Cardiovascular: Normal rate and regular rhythm.   Pulmonary/Chest: Effort normal and breath sounds normal.  Abdominal: Soft. There is no tenderness. There is no rebound and no guarding.  Musculoskeletal: Normal range of motion.  Neurological: Alert, disoriented, only oriented to self, no facial droop, fluent speech, moves all extremities symmetrically, sensation to light touch grossly in tact throughout Skin: Skin is warm and dry.  Psychiatric: Cooperative   ED Treatments / Results  Labs (all labs ordered are listed, but only abnormal results are displayed) Labs Reviewed  BASIC METABOLIC PANEL - Abnormal; Notable for the following:       Result Value   Sodium 132 (*)    Chloride 98 (*)    Glucose, Bld 235 (*)    BUN 24 (*)    Creatinine, Ser 1.48 (*)    Calcium 8.8 (*)    GFR calc non Af Amer 29 (*)    GFR calc Af Amer 33 (*)    All other components within normal limits  CBC - Abnormal; Notable for the following:    WBC 3.8 (*)    All other components within normal limits  HEPATIC FUNCTION PANEL - Abnormal; Notable for the following:    Albumin 3.4 (*)    Bilirubin, Direct <0.1 (*)    All other components within normal limits  CBG MONITORING, ED - Abnormal; Notable for the following:    Glucose-Capillary 164 (*)    All other components within normal limits  URINE CULTURE  DIFFERENTIAL  URINALYSIS, ROUTINE W REFLEX MICROSCOPIC (NOT AT [facility])  RAPID URINE DRUG SCREEN, HOSP PERFORMED  CBG MONITORING, ED  I-STAT TROPOININ, ED    EKG  EKG  Interpretation  Date/Time:  Thursday June 19 2016 12:36:16 EDT Ventricular Rate:  52 PR Interval:    QRS Duration: 92 QT Interval:  534 QTC Calculation: 497 R Axis:   65 Text Interpretation:  Sinus rhythm Consider left ventricular hypertrophy Borderline prolonged QT interval similar to prior EKG  Confirmed by Brigett Estell MD, Ashanta Amoroso [phone]([id number]) on 06/19/2016 2:31:37 PM       Radiology Dg Chest 2 View  Result Date: 06/19/2016 CLINICAL DATA:  Altered mental status.  Nausea. EXAM: CHEST  2 VIEW COMPARISON:  11/17/2014 FINDINGS: The cardiac silhouette remains mildly enlarged. Thoracic aortic atherosclerosis is again noted. There is mild pulmonary vascular congestion which has decreased from the prior study. Interstitial densities on the prior study have improved but not completely resolved, most notable in the left lung base though also present in the right perihilar region  as well. There is a small left pleural effusion. No pneumothorax. Thoracic spondylosis is noted. IMPRESSION: 1. Pulmonary vascular congestion and suspected mild interstitial edema, less than on the prior study. 2. Small left pleural effusion. 3. Aortic atherosclerosis. Electronically Signed   By: Sebastian Ache M.D.   On: 06/19/2016 14:06   Ct Head Wo Contrast  Result Date: 06/19/2016 CLINICAL DATA:  Altered mental status, nausea EXAM: CT HEAD WITHOUT CONTRAST TECHNIQUE: Contiguous axial images were obtained from the base of the skull through the vertex without intravenous contrast. COMPARISON:  None. FINDINGS: Brain: The ventricular system is prominent as are the cortical sulci and CSF spaces consistent with diffuse atrophy. The septum is midline in position. The fourth ventricle and basilar cisterns are unremarkable. Moderate small vessel ischemic changes noted in the periventricular white matter and small lacunar infarcts are present bilaterally. However no acute hemorrhage, mass lesion, or acute infarction is seen. and old best left  posterior parieto-occipital infarct is noted with encephalomalacia. Vascular: No vascular abnormality seen on this unenhanced study. Skull: On bone window images no calvarial fracture is noted. Sinuses/Orbits: The paranasal sinuses appear clear. Other: None IMPRESSION: 1. Atrophy and moderate small vessel ischemic change with old lacunar infarcts bilaterally. 2. No definite acute intracranial abnormality. Electronically Signed   By: Dwyane Dee M.D.   On: 06/19/2016 15:03    Procedures Procedures (including critical care time)  Medications Ordered in ED Medications  sodium chloride 0.9 % bolus 1,000 mL (1,000 mLs Intravenous New Bag/Given 06/19/16 1549)     Initial Impression / Assessment and Plan / ED Course  I have reviewed the triage vital signs and the nursing notes.  Pertinent labs & imaging results that were available during my care of the patient were reviewed by me and considered in my medical decision making (see chart for details).  Clinical Course    Present to a syncopal episode and altered mental status. She is nontoxic and in no acute distress on presentation. I hemodynamically stable and afebrile. No focal neurological deficits noted on exam. Remainder of exam is unremarkable and nonfocal.   In regards to her syncopal episode, EKG shows borderline QTC prolongation, but no other stigmata of arrhythmia and no acute ischemia. Troponin is negative. Basic blood work without significant anemia, elevated BUN/creatinine ratio, no evidence of severe dehydration. C/f tachy/bradyarrhythmia vs orthostasis as potential etiology of syncope.   She still is disoriented on exam, but no focal neurological deficits noted. CT head visualized and negative for acute intracranial processes. CXR visualized and without infiltrate or other acute cardiopulmonary processes. Concern for potential UTI, but patient unable to urinate, and difficult obtaining urine from straight cath. She'll receive IV  hydration and nurses will continue to try to get urine.  Given her disorientation which is not her baseline and syncopal episode, we'll plan to admit for observation for syncopal workup and close monitoring.    Final Clinical Impressions(s) / ED Diagnoses   Final diagnoses:  Syncope and collapse  Disorientation    New Prescriptions New Prescriptions   No medications on file     Lavera Guise, MD 06/19/16 [phone]

## 2016-06-19 NOTE — H&P (Signed)
History and Physical    Cynthia Todd ZOX:[id number] DOB: 01-24-1919 DOA: 06/19/2016  PCP: Dorrene German, MD  Patient coming from: Home  Chief Complaint: Smiley Houseman  HPI: Cynthia Todd is a 82 y.o. female with medical history significant of insulin-dependent diabetes mellitus, hypertension, history of diastolic heart failure who presents to the ED with a syncopal episode. Patient's daughter and granddaughter at bedside and helping with translation.  Note is patient woke up on the morning of admission was doing fine in her breakfast took all her medications and will talking to the family about drink she had had the night before. Patient then subsequently asked to go outside to take a walk. Patient walked around the car and axilla to the other side of the car subsequently got very tired and stated she needed to sit. Ninetta Lights was brought in as patient sat down it was noted that her eyes rolled back and patient passed out and was unresponsive for approximately 15 minutes. Family tried given patient some water however it would leak out of her mouth. Family also noticed that patient did have her bilateral upper extremities clenched. Patient was given some orange juice and subsequently became arousable. 911 was called and patient was brought to the ED. Per family CBG was checked by EMS however not sure what the CBG levels were. Per nursing CBGs was in the 180s. Family denies any bowel or urinary incontinence. No recent fevers, no chills. Per family patient feels cold all the time. No chest pain. No shortness of breath. No abdominal pain, no nausea, no vomiting, no dysuria, no constipation. Patient with some occasional diarrhea once to twice weekly. No melena, no hematemesis, no hematochezia, no coughing. Patient does endorse some heartburn and burping. Patient denies any dysuria. Patient unable to state whether she felt dizzy prior to episode of syncope. Per family patient has had some decline in  memory over the past 6 months. Family denies any hallucinations. Patient was seen in the emergency room head CT which was done was negative for any acute abnormalities. Orthostatics were done which was negative. Patient noted also to be on Amaryl which from PCPs office visit patient was supposed to discontinue. [facility] were called to admit the patient for further evaluation.  ED Course: Comprehensive metabolic profile done had a sodium of 132 chloride of 98 BUN of 24 creatinine of 1.48 glucose of 235 otherwise was within normal limits. EKG with no ischemic changes. Point-of-care troponin negative. CBC with a white count of 3.8 otherwise was within normal limits. Urinalysis was nitrite negative leukocytes negative, cloudy. Chest x-ray with some pulmonary vascular congestion and suspected mild interstitial edema less than on the prior study. Small left pleural effusion. Aortic atherosclerosis. Patient was given a bolus of IV fluids.  Review of Systems: As per HPI otherwise 10 point review of systems negative.   Past Medical History:  Diagnosis Date  . CKD (chronic kidney disease) stage 3, GFR 30-59 ml/min 06/19/2016  . Diabetes mellitus   . GERD (gastroesophageal reflux disease) 06/19/2016  . Hypertension     History reviewed. No pertinent surgical history.   reports that she has never smoked. She has never used smokeless tobacco. She reports that she does not drink alcohol or use drugs.  No Known Allergies  History reviewed. No pertinent family history. Family history reviewed and not pertinent. Patient states her father died when she was age 15 and not sure what he died from. Patient states mother also died prior to  age 73 and not sure what mother died from. Unacceptable: Noncontributory, unremarkable, or negative.  Prior to Admission medications   Medication Sig Start Date End Date Taking? Authorizing Provider  aspirin 81 MG tablet Take 1 tablet (81 mg total) by mouth  daily. Patient taking differently: Take 81 mg by mouth daily as needed. Does not take daily - Takes as needed for pain 11/20/14  Yes Penny Pia, MD  glimepiride (AMARYL) 4 MG tablet Take 4 mg by mouth daily. 05/13/16  Yes Historical Provider, MD  hydroxypropyl methylcellulose / hypromellose (ISOPTO TEARS / GONIOVISC) 2.5 % ophthalmic solution Place 3 drops into both eyes 3 (three) times daily. For dry eyes   Yes Historical Provider, MD  insulin glargine (LANTUS) 100 UNIT/ML injection Inject 0.25 mLs (25 Units total) into the skin daily. Patient taking differently: Inject 25 Units into the skin daily. Only given insulin if sugar is "bad" - not given daily 11/20/14  Yes Penny Pia, MD  isosorbide-hydrALAZINE (BIDIL) 20-37.5 MG per tablet Take 1 tablet by mouth daily. Takes at least once daily. Only sometimes takes twice daily   Yes Historical Provider, MD  metoprolol succinate (TOPROL-XL) 50 MG 24 hr tablet Take 50 mg by mouth daily. Take with or immediately following a meal.   Yes Historical Provider, MD  omeprazole (PRILOSEC) 20 MG capsule Take 20 mg by mouth 2 (two) times daily. 04/15/16  Yes Historical Provider, MD    Physical Exam: Vitals:   06/19/16 1308 06/19/16 1549 06/19/16 1730 06/19/16 1828  BP: 145/56 159/71 (!) 204/72 (!) 221/80  Pulse: (!) 53 (!) 56 (!) 59 72  Resp: 18 18 16 16   Temp: 97.7 F (36.5 C) 97.6 F (36.4 C)  98.1 F (36.7 C)  TempSrc: Oral Oral  Oral  SpO2: 100% 100% 98% 100%      Constitutional: NAD, calm, comfortable Vitals:   06/19/16 1308 06/19/16 1549 06/19/16 1730 06/19/16 1828  BP: 145/56 159/71 (!) 204/72 (!) 221/80  Pulse: (!) 53 (!) 56 (!) 59 72  Resp: 18 18 16 16   Temp: 97.7 F (36.5 C) 97.6 F (36.4 C)  98.1 F (36.7 C)  TempSrc: Oral Oral  Oral  SpO2: 100% 100% 98% 100%   Eyes: PERRLA, EOMI, lids and conjunctivae normal, Arcus senalis. ENMT: Mucous membranes are moist. Posterior pharynx clear of any exudate or lesions.Normal dentition.   Neck: normal, supple, no masses, no thyromegaly Respiratory: clear to auscultation bilaterally, no wheezing, no crackles. Normal respiratory effort. No accessory muscle use.  Cardiovascular: Regular rate and rhythm, no murmurs / rubs / gallops. No extremity edema. 2+ pedal pulses. No carotid bruits.  Abdomen: no tenderness, no masses palpated. No hepatosplenomegaly. Bowel sounds positive.  Musculoskeletal: no clubbing / cyanosis. No joint deformity upper and lower extremities. Good ROM, no contractures. Normal muscle tone.  Skin: no rashes, lesions, ulcers. No induration Neurologic: CN 2-12 grossly intact. Sensation intact, DTR normal. Strength 5/5 in all 4.  Psychiatric: Normal -fair judgment and insight. Alert and oriented x 2. Normal mood.   Labs on Admission: I have personally reviewed following labs and imaging studies  CBC:  Recent Labs Lab 06/19/16 1245  WBC 3.8*  NEUTROABS 2.2  HGB 13.2  HCT 38.1  MCV 91.6  PLT 189   Basic Metabolic Panel:  Recent Labs Lab 06/19/16 1245  NA 132*  K 4.1  CL 98*  CO2 26  GLUCOSE 235*  BUN 24*  CREATININE 1.48*  CALCIUM 8.8*   GFR: CrCl cannot be  calculated (Unknown ideal weight.). Liver Function Tests:  Recent Labs Lab 06/19/16 1245  AST 28  ALT 20  ALKPHOS 70  BILITOT 0.7  PROT 7.4  ALBUMIN 3.4*   No results for input(s): LIPASE, AMYLASE in the last 168 hours. No results for input(s): AMMONIA in the last 168 hours. Coagulation Profile: No results for input(s): INR, PROTIME in the last 168 hours. Cardiac Enzymes: No results for input(s): CKTOTAL, CKMB, CKMBINDEX, TROPONINI in the last 168 hours. BNP (last 3 results) No results for input(s): PROBNP in the last 8760 hours. HbA1C: No results for input(s): HGBA1C in the last 72 hours. CBG:  Recent Labs Lab 06/19/16 1229 06/19/16 1723  GLUCAP 164* 153*   Lipid Profile: No results for input(s): CHOL, HDL, LDLCALC, TRIG, CHOLHDL, LDLDIRECT in the last 72  hours. Thyroid Function Tests: No results for input(s): TSH, T4TOTAL, FREET4, T3FREE, THYROIDAB in the last 72 hours. Anemia Panel: No results for input(s): VITAMINB12, FOLATE, FERRITIN, TIBC, IRON, RETICCTPCT in the last 72 hours. Urine analysis:    Component Value Date/Time   COLORURINE YELLOW 06/19/2016 1235   APPEARANCEUR CLOUDY (A) 06/19/2016 1235   LABSPEC 1.015 06/19/2016 1235   PHURINE 6.5 06/19/2016 1235   GLUCOSEU NEGATIVE 06/19/2016 1235   HGBUR NEGATIVE 06/19/2016 1235   BILIRUBINUR NEGATIVE 06/19/2016 1235   KETONESUR NEGATIVE 06/19/2016 1235   PROTEINUR 100 (A) 06/19/2016 1235   UROBILINOGEN 0.2 06/13/2013 0431   NITRITE NEGATIVE 06/19/2016 1235   LEUKOCYTESUR NEGATIVE 06/19/2016 1235   Sepsis Labs: !!!!!!!!!!!!!!!!!!!!!!!!!!!!!!!!!!!!!!!!!!!! @LABRCNTIP (procalcitonin:4,lacticidven:4) )No results found for this or any previous visit (from the past 240 hour(s)).   Radiological Exams on Admission: Dg Chest 2 View  Result Date: 06/19/2016 CLINICAL DATA:  Altered mental status.  Nausea. EXAM: CHEST  2 VIEW COMPARISON:  11/17/2014 FINDINGS: The cardiac silhouette remains mildly enlarged. Thoracic aortic atherosclerosis is again noted. There is mild pulmonary vascular congestion which has decreased from the prior study. Interstitial densities on the prior study have improved but not completely resolved, most notable in the left lung base though also present in the right perihilar region as well. There is a small left pleural effusion. No pneumothorax. Thoracic spondylosis is noted. IMPRESSION: 1. Pulmonary vascular congestion and suspected mild interstitial edema, less than on the prior study. 2. Small left pleural effusion. 3. Aortic atherosclerosis. Electronically Signed   By: Sebastian AcheAllen  Grady M.D.   On: 06/19/2016 14:06   Ct Head Wo Contrast  Result Date: 06/19/2016 CLINICAL DATA:  Altered mental status, nausea EXAM: CT HEAD WITHOUT CONTRAST TECHNIQUE: Contiguous axial images  were obtained from the base of the skull through the vertex without intravenous contrast. COMPARISON:  None. FINDINGS: Brain: The ventricular system is prominent as are the cortical sulci and CSF spaces consistent with diffuse atrophy. The septum is midline in position. The fourth ventricle and basilar cisterns are unremarkable. Moderate small vessel ischemic changes noted in the periventricular white matter and small lacunar infarcts are present bilaterally. However no acute hemorrhage, mass lesion, or acute infarction is seen. and old best left posterior parieto-occipital infarct is noted with encephalomalacia. Vascular: No vascular abnormality seen on this unenhanced study. Skull: On bone window images no calvarial fracture is noted. Sinuses/Orbits: The paranasal sinuses appear clear. Other: None IMPRESSION: 1. Atrophy and moderate small vessel ischemic change with old lacunar infarcts bilaterally. 2. No definite acute intracranial abnormality. Electronically Signed   By: Dwyane DeePaul  Barry M.D.   On: 06/19/2016 15:03    EKG: Independently reviewed. Normal sinus rhythm. No  ischemic changes noted.  Assessment/Plan Principal Problem:   Syncope Active Problems:   Diabetes mellitus (HCC)   Malignant hypertension   Insulin dependent diabetes mellitus (HCC)   Hypertensive urgency   GERD (gastroesophageal reflux disease)   CKD (chronic kidney disease) stage 3, GFR 30-59 ml/min    #1 syncope Patient presented with what seems like a syncopal episode and per family patient was unresponsive for about approximately 15 minutes and noted to have eyes rolled back and arms clenched. No bladder or bowel incontinence noted. Check orthostatics. Head CT negative for any acute infarct. No signs or symptoms of infection. Will admit to telemetry, will check carotid Dopplers, 2-D echo, MRI of the head, EEG. BP management. Cycle cardiac enzymes every 6 hours 3, check a TSH, check a B-12 levels, check RPR, check RBC folate.  Monitor CBGs. Follow.  #2 malignant hypertension/hypertensive urgency Resume home regimen of BiDil and Toprol-XL. Hydralazine when necessary.  #3 insulin-dependent diabetes mellitus Check a hemoglobin A1c. Place on a sliding scale insulin. Per paper from PCPs office patient was supposed to stop taking Amaryl and a such this will be discontinued. Follow.  #4 gastroesophageal reflux disease PPI.  #5 chronic kidney disease stage III Stable.    DVT prophylaxis: Lovenox Code Status: Full Family Communication: Updated family at bedside. Disposition Plan: Home when medically stable. Consults called: None Admission status: Observation   Jermiya Reichl MD [facility] Pager [phone]  If 7PM-7AM, please contact night-coverage [email or web address] Password [email or web address]  06/19/2016, 6:40 PM

## 2016-06-19 NOTE — ED Notes (Signed)
PT CAN GO TO FLOOR AT 1738 

## 2016-06-20 ENCOUNTER — Observation Stay (HOSPITAL_BASED_OUTPATIENT_CLINIC_OR_DEPARTMENT_OTHER): Payer: Medicare Other

## 2016-06-20 ENCOUNTER — Observation Stay (HOSPITAL_COMMUNITY): Payer: Medicare Other

## 2016-06-20 ENCOUNTER — Observation Stay (HOSPITAL_COMMUNITY)
Admit: 2016-06-20 | Discharge: 2016-06-20 | Disposition: A | Discharge status: Home / Self Care | Payer: Medicare Other | Attending: Internal Medicine | Admitting: Internal Medicine

## 2016-06-20 DIAGNOSIS — R55 Syncope and collapse: Secondary | ICD-10-CM

## 2016-06-20 DIAGNOSIS — I639 Cerebral infarction, unspecified: Secondary | ICD-10-CM

## 2016-06-20 HISTORY — DX: Cerebral infarction, unspecified: I63.9

## 2016-06-20 LAB — GLUCOSE, CAPILLARY
GLUCOSE-CAPILLARY: 170 mg/dL — AB (ref 65–99)
GLUCOSE-CAPILLARY: 187 mg/dL — AB (ref 65–99)
Glucose-Capillary: 62 mg/dL — ABNORMAL LOW (ref 65–99)

## 2016-06-20 LAB — COMPREHENSIVE METABOLIC PANEL
ALBUMIN: 3.2 g/dL — AB (ref 3.5–5.0)
ALK PHOS: 74 U/L (ref 38–126)
ALT: 25 U/L (ref 14–54)
AST: 32 U/L (ref 15–41)
Anion gap: 5 (ref 5–15)
BILIRUBIN TOTAL: 0.4 mg/dL (ref 0.3–1.2)
BUN: 24 mg/dL — AB (ref 6–20)
CALCIUM: 8.4 mg/dL — AB (ref 8.9–10.3)
CO2: 25 mmol/L (ref 22–32)
Chloride: 105 mmol/L (ref 101–111)
Creatinine, Ser: 1.3 mg/dL — ABNORMAL HIGH (ref 0.44–1.00)
GFR calc Af Amer: 39 mL/min — ABNORMAL LOW (ref >60)
GFR calc non Af Amer: 34 mL/min — ABNORMAL LOW (ref >60)
GLUCOSE: 118 mg/dL — AB (ref 65–99)
Potassium: 4.1 mmol/L (ref 3.5–5.1)
Sodium: 135 mmol/L (ref 135–145)
TOTAL PROTEIN: 7.1 g/dL (ref 6.5–8.1)

## 2016-06-20 LAB — TROPONIN I
Troponin I: 0.04 ng/mL (ref <0.03)
Troponin I: 0.04 ng/mL (ref <0.03)

## 2016-06-20 LAB — HIV ANTIBODY (ROUTINE TESTING W REFLEX): HIV SCREEN 4TH GENERATION: NONREACTIVE

## 2016-06-20 LAB — ECHOCARDIOGRAM COMPLETE
Height: 64 in
Weight: 1908.3 oz

## 2016-06-20 LAB — CBC
HCT: 34.7 % — ABNORMAL LOW (ref 36.0–46.0)
Hemoglobin: 11.5 g/dL — ABNORMAL LOW (ref 12.0–15.0)
MCH: 31.2 pg (ref 26.0–34.0)
MCHC: 33.1 g/dL (ref 30.0–36.0)
MCV: 94 fL (ref 78.0–100.0)
Platelets: 189 10*3/uL (ref 150–400)
RBC: 3.69 MIL/uL — ABNORMAL LOW (ref 3.87–5.11)
RDW: 13.2 % (ref 11.5–15.5)
WBC: 4.1 10*3/uL (ref 4.0–10.5)

## 2016-06-20 LAB — URINE CULTURE: Culture: NO GROWTH

## 2016-06-20 LAB — VITAMIN B12: Vitamin B-12: 376 pg/mL (ref 180–914)

## 2016-06-20 MED ORDER — ISOSORB DINITRATE-HYDRALAZINE 20-37.5 MG PO TABS
1.0000 | ORAL_TABLET | Freq: Once | ORAL | Status: AC
  Administered 2016-06-20: 1 via ORAL
  Filled 2016-06-20: qty 1

## 2016-06-20 MED ORDER — ISOSORB DINITRATE-HYDRALAZINE 20-37.5 MG PO TABS
1.0000 | ORAL_TABLET | Freq: Two times a day (BID) | ORAL | Status: DC
  Administered 2016-06-20: 1 via ORAL
  Filled 2016-06-20: qty 1

## 2016-06-20 MED ORDER — HYDRALAZINE HCL 20 MG/ML IJ SOLN: 5.0000 mg | INTRAMUSCULAR | Status: DC | PRN

## 2016-06-20 MED ORDER — ISOSORB DINITRATE-HYDRALAZINE 20-37.5 MG PO TABS: 2.0000 | ORAL_TABLET | Freq: Two times a day (BID) | ORAL | 0 refills | Status: DC

## 2016-06-20 MED ORDER — ISOSORB DINITRATE-HYDRALAZINE 20-37.5 MG PO TABS: 2.0000 | ORAL_TABLET | Freq: Two times a day (BID) | ORAL | Status: DC

## 2016-06-20 MED ORDER — CYANOCOBALAMIN 250 MCG PO TABS: 250.0000 ug | ORAL_TABLET | Freq: Every day | ORAL | 0 refills | Status: DC

## 2016-06-20 NOTE — Care Management Obs Status (Signed)
MEDICARE OBSERVATION STATUS NOTIFICATION   Patient Details  Name: Cynthia Todd MRN: [id number] Date of Birth: 04/14/1919   Medicare Observation Status Notification Given:  Yes    MahabirOlegario Messier, Kayvon Mo, RN 06/20/2016, 10:53 AM

## 2016-06-20 NOTE — Progress Notes (Signed)
EEG Completed; Results Pending  

## 2016-06-20 NOTE — Evaluation (Signed)
Physical Therapy Evaluation Patient Details Name: Cynthia Todd Betancur MRN: [id number] DOB: 11/21/1918 Today's Date: 06/20/2016   History of Present Illness  Cynthia Todd Earp is a 82 y.o. female with medical history significant of insulin-dependent diabetes mellitus, hypertension, history of diastolic heart failure admitted for syncopal episode.  Clinical Impression  Pt evaluated by PT with no further acute PT needs identified. All education has been completed and the pt has no further questions. See below for any follow-up PT or equipment needs. PT is supervision with all mobility. Pt's daughter reported that they are capable to resume full-time care. Recommending RW so that pt can ambulate with increased stability and safety. PT is signing off.    Follow Up Recommendations No PT follow up    Equipment Recommendations  Rolling walker with 5" wheels    Recommendations for Other Services       Precautions / Restrictions Precautions Precautions: Fall Restrictions Weight Bearing Restrictions: No      Mobility  Bed Mobility Overal bed mobility: Independent                Transfers Overall transfer level: Needs assistance Equipment used: Rolling walker (2 wheeled);None Transfers: Sit to/from Stand Sit to Stand: Supervision         General transfer comment: pt performed rise and controlled descent without contact with RW (for safety PRN and for ambulation)  Ambulation/Gait Ambulation/Gait assistance: Supervision Ambulation Distance (Feet): 40 Feet Assistive device: Rolling walker (2 wheeled) Gait Pattern/deviations: Step-through pattern;Decreased stride length     General Gait Details: supervision for safety; required multimodal cues for RW positioning  Stairs            Wheelchair Mobility    Modified Rankin (Stroke Patients Only)       Balance Overall balance assessment: Needs assistance   Sitting balance-Leahy Scale: Good     Standing balance support:  Bilateral upper extremity supported;During functional activity Standing balance-Leahy Scale: Poor Standing balance comment: requires RW for balance and support during gait; reports no history of falls                             Pertinent Vitals/Pain Pain Assessment: No/denies pain    Home Living Family/patient expects to be discharged to:: Private residence Living Arrangements: Children;Other relatives (daughter and granddaughter) Available Help at Discharge: Family Type of Home: House Home Access: Stairs to enter Entrance Stairs-Rails: None Entrance Stairs-Number of Steps: 1 Home Layout: One level Home Equipment: Cane - single point;Shower seat Additional Comments: daughter reports that the pt will attempt to perform most ADLs on her own but that it is preferred that she or the granddaughter take care of the pt for safety and comfort    Prior Function Level of Independence: Independent with assistive device(s)   Gait / Transfers Assistance Needed: uses cane but daughter states that patient should be using a RW  ADL's / Homemaking Assistance Needed: Pt is able to perform ADLs, however family provides assist for bathing, dressing, meal prep, home mgt        Hand Dominance   Dominant Hand: Right    Extremity/Trunk Assessment   Upper Extremity Assessment: Overall WFL for tasks assessed           Lower Extremity Assessment: Overall WFL for tasks assessed         Communication   Communication: Prefers language other than [place];No difficulties  Cognition Arousal/Alertness: Awake/alert Behavior [facility]: [facility]  for tasks assessed/performed Overall Cognitive Status: Within Functional Limits for tasks assessed                      General Comments      Exercises     Assessment/Plan    PT Assessment Patent does not need any further PT services  PT Problem List            PT Treatment Interventions      PT Goals (Current goals can  be found in the Care Plan section)  Acute Rehab PT Goals PT Goal Formulation: All assessment and education complete, DC therapy    Frequency     Barriers to discharge        Co-evaluation               End of Session Equipment Utilized During Treatment: Gait belt Activity Tolerance: Patient tolerated treatment well Patient left: in chair;with call bell/phone within reach;with family/visitor present      Functional Assessment Tool Used: clinical judgement Functional Limitation: Mobility: Walking and moving around Mobility: Walking and Moving Around Current Status (Z6109): At least 1 percent but less than 20 percent impaired, limited or restricted Mobility: Walking and Moving Around Goal Status [id number]): At least 1 percent but less than 20 percent impaired, limited or restricted Mobility: Walking and Moving Around Discharge Status [phone]): At least 1 percent but less than 20 percent impaired, limited or restricted    Time: 9147-8295 PT Time Calculation (min) (ACUTE ONLY): 11 min   Charges:   PT Evaluation $PT Eval Low Complexity: 1 Procedure     PT G Codes:   PT G-Codes **NOT FOR INPATIENT CLASS** Functional Assessment Tool Used: clinical judgement Functional Limitation: Mobility: Walking and moving around Mobility: Walking and Moving Around Current Status (A2130): At least 1 percent but less than 20 percent impaired, limited or restricted Mobility: Walking and Moving Around Goal Status [phone]): At least 1 percent but less than 20 percent impaired, limited or restricted Mobility: Walking and Moving Around Discharge Status [phone]): At least 1 percent but less than 20 percent impaired, limited or restricted    Karalee Height 06/20/2016, 1:38 PM Karalee Height, SPT

## 2016-06-20 NOTE — Care Management Note (Signed)
Case Management Note  Patient Details  Name: Beatris SiMaryan M Bernasconi MRN: [id number] Date of Birth: 10/13/1918  Subjective/Objective: 82 y/o f admitted w/syncope. From home w/supportive family. PT cons-await recc.                   Action/Plan:d/c plan home.   Expected Discharge Date:   (unknown)               Expected Discharge Plan:  Home/Self Care  In-House Referral:     Discharge planning Services  CM Consult  Post Acute Care Choice:    Choice offered to:     DME Arranged:    DME Agency:     HH Arranged:    HH Agency:     Status of Service:  In process, will continue to follow  If discussed at Long Length of Stay Meetings, dates discussed:    Additional Comments:  Lanier ClamMahabir, Jyron Turman, RN 06/20/2016, 10:53 AM

## 2016-06-20 NOTE — Discharge Instructions (Signed)
Follow with Primary MD Dorrene GermanEdwin A Avbuere, MD in 7 days   Get CBC, CMP, checked  by Primary MD next visit.    Activity: As tolerated with Full fall precautions use walker/cane & assistance as needed   Disposition Home    Diet: Heart Healthy,  carbohydrae modified  , with feeding assistance and aspiration precautions.  For Heart failure patients - Check your Weight same time everyday, if you gain over 2 pounds, or you develop in leg swelling, experience more shortness of breath or chest pain, call your Primary MD immediately. Follow Cardiac Low Salt Diet and 1.5 lit/day fluid restriction.   On your next visit with your primary care physician please Get Medicines reviewed and adjusted.   Please request your Prim.MD to go over all Hospital Tests and Procedure/Radiological results at the follow up, please get all Hospital records sent to your Prim MD by signing hospital release before you go home.   If you experience worsening of your admission symptoms, develop shortness of breath, life threatening emergency, suicidal or homicidal thoughts you must seek medical attention immediately by calling 911 or calling your MD immediately  if symptoms less severe.  You Must read complete instructions/literature along with all the possible adverse reactions/side effects for all the Medicines you take and that have been prescribed to you. Take any new Medicines after you have completely understood and accpet all the possible adverse reactions/side effects.   Do not drive, operating heavy machinery, perform activities at heights, swimming or participation in water activities or provide baby sitting services if your were admitted for syncope or siezures until you have seen by Primary MD or a Neurologist and advised to do so again.  Do not drive when taking Pain medications.    Do not take more than prescribed Pain, Sleep and Anxiety Medications  Special Instructions: If you have smoked or chewed Tobacco   in the last 2 yrs please stop smoking, stop any regular Alcohol  and or any Recreational drug use.  Wear Seat belts while driving.   Please note  You were cared for by a hospitalist during your hospital stay. If you have any questions about your discharge medications or the care you received while you were in the hospital after you are discharged, you can call the unit and asked to speak with the hospitalist on call if the hospitalist that took care of you is not available. Once you are discharged, your primary care physician will handle any further medical issues. Please note that NO REFILLS for any discharge medications will be authorized once you are discharged, as it is imperative that you return to your primary care physician (or establish a relationship with a primary care physician if you do not have one) for your aftercare needs so that they can reassess your need for medications and monitor your lab values.

## 2016-06-20 NOTE — Discharge Summary (Signed)
Cynthia Todd, is a 82 y.o. female  DOB [birth year]  MRN [id number].  Admission date:  06/19/2016  Admitting Physician  Rodolph Bong, MD  Discharge Date:  06/20/2016   Primary MD  Dorrene German, MD  Recommendations for primary care physician for things to follow:  -  Please follow still pending at time of discharge on final results on the EEG reading, still pending at time of discharge.   Admission Diagnosis  Syncope and collapse [R55] Disorientation [R41.0]   Discharge Diagnosis  Syncope and collapse [R55] Disorientation [R41.0]    Principal Problem:   Syncope Active Problems:   Diabetes mellitus (HCC)   Malignant hypertension   Insulin dependent diabetes mellitus (HCC)   Hypertensive urgency   GERD (gastroesophageal reflux disease)   CKD (chronic kidney disease) stage 3, GFR 30-59 ml/min      Past Medical History:  Diagnosis Date  . CKD (chronic kidney disease) stage 3, GFR 30-59 ml/min 06/19/2016  . Diabetes mellitus   . GERD (gastroesophageal reflux disease) 06/19/2016  . Hypertension     History reviewed. No pertinent surgical history.     History of present illness and  Hospital Course:     Kindly see H&P for history of present illness and admission details, please review complete Labs, Consult reports and Test reports for all details in brief  HPI  from the history and physical done on the day of admission 06/19/2016  HPI: Cynthia Todd is a 82 y.o. female with medical history significant of insulin-dependent diabetes mellitus, hypertension, history of diastolic heart failure who presents to the ED with a syncopal episode. Patient's daughter and granddaughter at bedside and helping with translation.  Note is patient woke up on the morning of admission was doing fine in her breakfast took all her medications and will talking to the family about drink she had had the  night before. Patient then subsequently asked to go outside to take a walk. Patient walked around the car and axilla to the other side of the car subsequently got very tired and stated she needed to sit. Cynthia Todd was brought in as patient sat down it was noted that her eyes rolled back and patient passed out and was unresponsive for approximately 15 minutes. Family tried given patient some water however it would leak out of her mouth. Family also noticed that patient did have her bilateral upper extremities clenched. Patient was given some orange juice and subsequently became arousable. 911 was called and patient was brought to the ED. Per family CBG was checked by EMS however not sure what the CBG levels were. Per nursing CBGs was in the 180s. Family denies any bowel or urinary incontinence. No recent fevers, no chills. Per family patient feels cold all the time. No chest pain. No shortness of breath. No abdominal pain, no nausea, no vomiting, no dysuria, no constipation. Patient with some occasional diarrhea once to twice weekly. No melena, no hematemesis, no hematochezia, no coughing. Patient does endorse some heartburn and  burping. Patient denies any dysuria. Patient unable to state whether she felt dizzy prior to episode of syncope. Per family patient has had some decline in memory over the past 6 months. Family denies any hallucinations. Patient was seen in the emergency room head CT which was done was negative for any acute abnormalities. Orthostatics were done which was negative. Patient noted also to be on Amaryl which from PCPs office visit patient was supposed to discontinue. [facility] were called to admit the patient for further evaluation.  ED Course: Comprehensive metabolic profile done had a sodium of 132 chloride of 98 BUN of 24 creatinine of 1.48 glucose of 235 otherwise was within normal limits. EKG with no ischemic changes. Point-of-care troponin negative. CBC with a white count of 3.8  otherwise was within normal limits. Urinalysis was nitrite negative leukocytes negative, cloudy. Chest x-ray with some pulmonary vascular congestion and suspected mild interstitial edema less than on the prior study. Small left pleural effusion. Aortic atherosclerosis. Patient was given a bolus of IV fluids.   Hospital Course   Syncope - I do think this was most likely related to poorly controlled blood pressure, patient with extensive workup including CT head significant for atrophy and moderate small vessel ischemic changes and old lacunar infarct, otherwise no acute abnormalities, MRI brain significant for chronic left PCA infarct, left-sided subdural hygroma, and extensive chronic small vessel disease, but no evidence of intracranial abnormalities, the echo significant for EF 55-60%, no regional wall motion abnormalities,, as well as significant formild AI and moderate MR, moderate LAE and Grade 3 DD with high left and right heart filling pressures (RVSP of 68 mmHg), carotid Doppler technically difficult, but with no significant stenosis, EEG was done, readings still pending at time of discharge, she was not orthostatic. - TSH within normal limits, B-12 on the lower side, will discharge on supplement  Hypertension - Poorly controlled, continue with Toprol-XL, BiDil was increased to 2 tablets oral twice daily  Insulin-dependent diabetes mellitus - Resume home regimen on discharge  GERD - Continue with PPI  CKD - at baseline.  Elevated troponins - Non-ACS pattern, 0.04> 0.04> 0.04, denies any chest pain or shortness of breath, he echo with no regional wall motion abnormality  Discharge Condition:  Stable Discussed with the granddaughter and grandson at bedside   Follow UP  Follow-up Information    Dorrene GermanEdwin A Avbuere, MD Follow up in 1 week(s).   Specialty:  Internal Medicine Contact information: [address] [place] [id number] [phone]             Discharge  Instructions  and  Discharge Medications     Discharge Instructions    Discharge instructions    Complete by:  As directed    Follow with Primary MD Dorrene GermanEdwin A Avbuere, MD in 7 days   Get CBC, CMP, checked  by Primary MD next visit.    Activity: As tolerated with Full fall precautions use walker/cane & assistance as needed   Disposition Home    Diet: Heart Healthy,  Carbohydrate modified  , with feeding assistance and aspiration precautions.  For Heart failure patients - Check your Weight same time everyday, if you gain over 2 pounds, or you develop in leg swelling, experience more shortness of breath or chest pain, call your Primary MD immediately. Follow Cardiac Low Salt Diet and 1.5 lit/day fluid restriction.   On your next visit with your primary care physician please Get Medicines reviewed and adjusted.   Please request  your Prim.MD to go over all Hospital Tests and Procedure/Radiological results at the follow up, please get all Hospital records sent to your Prim MD by signing hospital release before you go home.   If you experience worsening of your admission symptoms, develop shortness of breath, life threatening emergency, suicidal or homicidal thoughts you must seek medical attention immediately by calling 911 or calling your MD immediately  if symptoms less severe.  You Must read complete instructions/literature along with all the possible adverse reactions/side effects for all the Medicines you take and that have been prescribed to you. Take any new Medicines after you have completely understood and accpet all the possible adverse reactions/side effects.   Do not drive, operating heavy machinery, perform activities at heights, swimming or participation in water activities or provide baby sitting services if your were admitted for syncope or siezures until you have seen by Primary MD or a Neurologist and advised to do so again.  Do not drive when taking Pain medications.     Do not take more than prescribed Pain, Sleep and Anxiety Medications  Special Instructions: If you have smoked or chewed Tobacco  in the last 2 yrs please stop smoking, stop any regular Alcohol  and or any Recreational drug use.  Wear Seat belts while driving.   Please note  You were cared for by a hospitalist during your hospital stay. If you have any questions about your discharge medications or the care you received while you were in the hospital after you are discharged, you can call the unit and asked to speak with the hospitalist on call if the hospitalist that took care of you is not available. Once you are discharged, your primary care physician will handle any further medical issues. Please note that NO REFILLS for any discharge medications will be authorized once you are discharged, as it is imperative that you return to your primary care physician (or establish a relationship with a primary care physician if you do not have one) for your aftercare needs so that they can reassess your need for medications and monitor your lab values.   Increase activity slowly    Complete by:  As directed        Medication List    TAKE these medications   aspirin 81 MG tablet Take 1 tablet (81 mg total) by mouth daily. What changed:  when to take this  reasons to take this  additional instructions   glimepiride 4 MG tablet Commonly known as:  AMARYL Take 4 mg by mouth daily.   hydroxypropyl methylcellulose / hypromellose 2.5 % ophthalmic solution Commonly known as:  ISOPTO TEARS / GONIOVISC Place 3 drops into both eyes 3 (three) times daily. For dry eyes   insulin glargine 100 UNIT/ML injection Commonly known as:  LANTUS Inject 0.25 mLs (25 Units total) into the skin daily. What changed:  additional instructions   isosorbide-hydrALAZINE 20-37.5 MG tablet Commonly known as:  BIDIL Take 2 tablets by mouth 2 (two) times daily. Takes at least once daily. Only sometimes takes  twice daily What changed:  how much to take  when to take this   metoprolol succinate 50 MG 24 hr tablet Commonly known as:  TOPROL-XL Take 50 mg by mouth daily. Take with or immediately following a meal.   omeprazole 20 MG capsule Commonly known as:  PRILOSEC Take 20 mg by mouth 2 (two) times daily.   vitamin B-12 250 MCG tablet Commonly known as:  CYANOCOBALAMIN Take 1  tablet (250 mcg total) by mouth daily.         Diet and Activity recommendation: See Discharge Instructions above   Consults obtained -  None   Major procedures and Radiology Reports - PLEASE review detailed and final reports for all details, in brief -     Dg Chest 2 View  Result Date: 06/19/2016 CLINICAL DATA:  Altered mental status.  Nausea. EXAM: CHEST  2 VIEW COMPARISON:  11/17/2014 FINDINGS: The cardiac silhouette remains mildly enlarged. Thoracic aortic atherosclerosis is again noted. There is mild pulmonary vascular congestion which has decreased from the prior study. Interstitial densities on the prior study have improved but not completely resolved, most notable in the left lung base though also present in the right perihilar region as well. There is a small left pleural effusion. No pneumothorax. Thoracic spondylosis is noted. IMPRESSION: 1. Pulmonary vascular congestion and suspected mild interstitial edema, less than on the prior study. 2. Small left pleural effusion. 3. Aortic atherosclerosis. Electronically Signed   By: Sebastian Ache M.D.   On: 06/19/2016 14:06   Ct Head Wo Contrast  Result Date: 06/19/2016 CLINICAL DATA:  Altered mental status, nausea EXAM: CT HEAD WITHOUT CONTRAST TECHNIQUE: Contiguous axial images were obtained from the base of the skull through the vertex without intravenous contrast. COMPARISON:  None. FINDINGS: Brain: The ventricular system is prominent as are the cortical sulci and CSF spaces consistent with diffuse atrophy. The septum is midline in position. The fourth  ventricle and basilar cisterns are unremarkable. Moderate small vessel ischemic changes noted in the periventricular white matter and small lacunar infarcts are present bilaterally. However no acute hemorrhage, mass lesion, or acute infarction is seen. and old best left posterior parieto-occipital infarct is noted with encephalomalacia. Vascular: No vascular abnormality seen on this unenhanced study. Skull: On bone window images no calvarial fracture is noted. Sinuses/Orbits: The paranasal sinuses appear clear. Other: None IMPRESSION: 1. Atrophy and moderate small vessel ischemic change with old lacunar infarcts bilaterally. 2. No definite acute intracranial abnormality. Electronically Signed   By: Dwyane Dee M.D.   On: 06/19/2016 15:03   Mri Brain Without Contrast  Result Date: 06/20/2016 CLINICAL DATA:  Syncope.  Altered mental status and nausea. EXAM: MRI HEAD WITHOUT CONTRAST TECHNIQUE: Multiplanar, multiecho pulse sequences of the brain and surrounding structures were obtained without intravenous contrast. COMPARISON:  Head CT 06/19/2016 FINDINGS: Brain: There is no evidence of acute infarct, mass, or midline shift. Asymmetric but small CSF collection in the posterior fossa along the lateral margin of the left cerebellar hemisphere measures up to 8 mm in thickness and may reflect a small subdural hygroma with slight flattening of the cerebellum. There is an old left PCA territory infarct involving the occipital lobe and small portion of the parietal lobe. There is advanced cerebral atrophy. Patchy to confluent T2 hyperintensities in the cerebral white matter bilaterally are nonspecific but compatible with moderately extensive chronic small vessel ischemia. Mild chronic small vessel ischemic disease is noted in the brainstem. There are small chronic infarcts in the left cerebellum, bilateral basal ganglia, left thalamus, and bilateral deep cerebral white matter. Vascular: Major intracranial vascular flow  voids are preserved. Skull and upper cervical spine: Mild hyperostosis frontalis interna. Advanced multilevel cervical disc degeneration without gross spinal cord compression. Sinuses/Orbits: Prior bilateral cataract extraction. Minimal scattered mucosal thickening in the paranasal sinuses. No significant mastoid fluid. Other: None. IMPRESSION: 1. No acute intracranial abnormality. 2. Moderately extensive chronic small vessel ischemic disease with chronic lacunar  infarcts as above. 3. Chronic left PCA infarct. 4. Suspected small left-sided subdural hygroma in the posterior fossa. Electronically Signed   By: Sebastian Ache M.D.   On: 06/20/2016 15:03    Micro Results    Recent Results (from the past 240 hour(s))  Culture, Urine     Status: None   Collection Time: 06/19/16  4:20 PM  Result Value Ref Range Status   Specimen Description URINE, CLEAN CATCH  Final   Special Requests NONE  Final   Culture NO GROWTH Performed at [facility]   Final   Report Status 06/20/2016 FINAL  Final       Today   Subjective:   Cynthia Todd today has no headache,no chest or  abdominal pain, denies any complaints  Objective:   Blood pressure (!) 135/45, pulse 64, temperature 98.5 F (36.9 C), temperature source Oral, resp. rate 16, height 5\' 4"  (1.626 m), weight 54.1 kg (119 lb 4.3 oz), SpO2 100 %.   Intake/Output Summary (Last 24 hours) at 06/20/16 1601 Last data filed at 06/20/16 0900  Gross per 24 hour  Intake              360 ml  Output                0 ml  Net              360 ml    Exam Awake Alert, pleasent Supple Neck,No JVD,  Symmetrical Chest wall movement, Good air movement bilaterally, CTAB RRR,No Gallops,Rubs or new Murmurs, +ve B.Sounds, Abd Soft, Non tender, No organomegaly appriciated, No rebound -guarding or rigidity. No Cyanosis, Clubbing or edema, No new Rash or bruise  Data Review   CBC w Diff:  Lab Results  Component Value Date   WBC 4.1 06/20/2016   HGB 11.5  (L) 06/20/2016   HCT 34.7 (L) 06/20/2016   PLT 189 06/20/2016   LYMPHOPCT 26 06/19/2016   MONOPCT 8 06/19/2016   EOSPCT 5 06/19/2016   BASOPCT 1 06/19/2016    CMP:  Lab Results  Component Value Date   NA 135 06/20/2016   K 4.1 06/20/2016   CL 105 06/20/2016   CO2 25 06/20/2016   BUN 24 (H) 06/20/2016   CREATININE 1.30 (H) 06/20/2016   PROT 7.1 06/20/2016   ALBUMIN 3.2 (L) 06/20/2016   BILITOT 0.4 06/20/2016   ALKPHOS 74 06/20/2016   AST 32 06/20/2016   ALT 25 06/20/2016  .   Total Time in preparing paper work, data evaluation and todays exam - 35 minutes  Cynthia Todd M.D on 06/20/2016 at 4:01 PM  [facility]   Office  [phone]

## 2016-06-20 NOTE — Evaluation (Signed)
Occupational Therapy Evaluation Patient Details Name: Cynthia Todd MRN: [id number] DOB: 1919-01-07 Today's Date: 06/20/2016    History of Present Illness Cynthia Todd is a 82 y.o. female with medical history significant of insulin-dependent diabetes mellitus, hypertension, history of diastolic heart failure who presents to the ED with a syncopal episode.   Clinical Impression   Pt is at baseline PLOF with ADLs. Pt is bale to perform her own ADLs, however family provides assist. Pt using cane at home and daughter states that she is not steady and safe using it any longer and that they would like to have a RW. No further acute OT indicated at this time, defer functional mobility safety/balance to PT    Follow Up Recommendations  No OT follow up;Supervision - Intermittent    Equipment Recommendations  None recommended by OT , RW for safe mobility?   Recommendations for Other Services PT consult     Precautions / Restrictions Precautions Precautions: Fall Restrictions Weight Bearing Restrictions: No      Mobility Bed Mobility Overal bed mobility: Independent                Transfers Overall transfer level: Needs assistance Equipment used: 1 person hand held assist;Rolling walker (2 wheeled) Transfers: Sit to/from Stand                Balance Overall balance assessment: Needs assistance   Sitting balance-Leahy Scale: Good       Standing balance-Leahy Scale: Poor Standing balance comment: balance/safety improved with using RW                            ADL Overall ADL's : At baseline                                       General ADL Comments: Pt is able to perform ADLs, however family provides assist     Vision Vision Assessment?: No apparent visual deficits          Pertinent Vitals/Pain Pain Assessment: No/denies pain     Hand Dominance Right   Extremity/Trunk Assessment Upper Extremity Assessment Upper  Extremity Assessment: Overall WFL for tasks assessed   Lower Extremity Assessment Lower Extremity Assessment: Defer to PT evaluation       Communication Communication Communication: Prefers language other than [place];No difficulties (Pt is from [place], minimal [place])   Cognition Arousal/Alertness: Awake/alert Behavior During Therapy: WFL for tasks assessed/performed Overall Cognitive Status: Within Functional Limits for tasks assessed                     General Comments   pt very pleasant and cooperative, family very supportive                 Home Living Family/patient expects to be discharged to:: Private residence Living Arrangements: Children;Other relatives Available Help at Discharge: Family Type of Home: House Home Access: Stairs to enter [employer] of Steps: 1 Entrance Stairs-Rails: None Home Layout: One level     Bathroom Shower/Tub: Producer, television/film/video: Standard     Home Equipment: Cane - single point;Shower seat          Prior Functioning/Environment Level of Independence: Independent with assistive device(s)  Gait / Transfers Assistance Needed: uses cane but daughter states that patient should be using a RW  ADL's / Homemaking Assistance Needed: Pt is able to perform ADLs, however family provides assist for bathing, dressing, meal prep, home mgt            OT Problem List: Impaired balance (sitting and/or standing)   OT Treatment/Interventions:      OT Goals(Current goals can be found in the care plan section) Acute Rehab OT Goals Patient Stated Goal: retrun home per family OT Goal Formulation: With patient  OT Frequency:     Barriers to D/C:  no barriers                        End of Session Equipment Utilized During Treatment: Gait belt;Rolling walker  Activity Tolerance: Patient tolerated treatment well Patient left: in bed;with call bell/phone within reach;with family/visitor present    Time: 1101-1120 OT Time Calculation (min): 19 min Charges:  OT General Charges $OT Visit: 1 Procedure OT Evaluation $OT Eval Moderate Complexity: 1 Procedure G-Codes: OT G-codes **NOT FOR INPATIENT CLASS** Functional Assessment Tool Used: clinical judgement Functional Limitation: Self care Self Care Current Status (W0981(G8987): 0 percent impaired, limited or restricted Self Care Goal Status (X9147(G8988): 0 percent impaired, limited or restricted Self Care Discharge Status (W2956(G8989): 0 percent impaired, limited or restricted  Galen ManilaSpencer, Keyden Pavlov Jeanette 06/20/2016, 12:55 PM

## 2016-06-20 NOTE — Progress Notes (Signed)
Hypoglycemic Event  CBG: 62  Treatment: 15 GM carbohydrate snack  Symptoms: None  Follow-up CBG: ZOXW:[id number]:[id number] CBG Result:170  Possible Reasons for Event: Unknown  Comments/MD notified:    Deneise Levereynolds, Letesha Klecker Baise

## 2016-06-20 NOTE — Progress Notes (Signed)
VASCULAR LAB PRELIMINARY  PRELIMINARY  PRELIMINARY  PRELIMINARY  Carotid duplex completed.    Preliminary report:  Technically difficult due to severe tortuosity and high bifurcations bilaterally. Bilateral:  1-39% ICA stenosis.  Vertebral artery flow is antegrade.     Cynthia Todd, RVS 06/20/2016, 9:47 AM

## 2016-06-20 NOTE — Progress Notes (Signed)
  Echocardiogram 2D Echocardiogram has been performed.  Nolon RodBrown, Tony 06/20/2016, 1:01 PM

## 2016-06-20 NOTE — Procedures (Signed)
.  History: 82 year old with syncope  Sedation: none  Technique: This is a 21 channel routine scalp EEG performed at the bedside with bipolar and monopolar montages arranged in accordance to the international 10/20 system of electrode placement. One channel was dedicated to EKG recording.    Background: The background consists of intermixed alpha and beta activities. There is a well defined posterior dominant rhythm of 8.5 Hz that attenuates with eye opening. Sleep is not recorded Photic stimulation is not performed.  Clinical Interpretation: This normal EEG is recorded in the waking state. There was no seizure or seizure predisposition recorded on this study. Please note that a normal EEG does not preclude the possibility of epilepsy.    Lebron QuamAntonia Todd [place] [facility]   If 7pm- 7am, please page neurology on call as listed in AMION.

## 2016-06-21 LAB — HEMOGLOBIN A1C
HEMOGLOBIN A1C: 5.7 % — AB (ref 4.8–5.6)
Mean Plasma Glucose: 117 mg/dL

## 2016-06-21 LAB — RPR, QUANT+TP ABS (REFLEX)
Rapid Plasma Reagin, Quant: 1:2 {titer} — ABNORMAL HIGH
TREPONEMA PALLIDUM AB: POSITIVE — AB

## 2016-06-21 LAB — RPR: RPR: REACTIVE — AB

## 2016-06-22 LAB — VAS US CAROTID
LCCADDIAS: -28 cm/s
LCCAPSYS: 107 cm/s
LEFT ECA DIAS: 18 cm/s
LEFT VERTEBRAL DIAS: -7 cm/s
LICADSYS: 86 cm/s
Left CCA dist sys: -190 cm/s
Left CCA prox dias: 17 cm/s
Left ICA dist dias: 11 cm/s
Left ICA prox dias: -23 cm/s
Left ICA prox sys: -117 cm/s
RCCADSYS: -75 cm/s
RCCAPDIAS: -16 cm/s
RCCAPSYS: -73 cm/s
RIGHT ECA DIAS: -5 cm/s
RIGHT VERTEBRAL DIAS: -27 cm/s

## 2016-06-23 LAB — FOLATE RBC
FOLATE, HEMOLYSATE: 538.9 ng/mL
Folate, RBC: 1590 ng/mL (ref >498)
HEMATOCRIT: 33.9 % — AB (ref 34.0–46.6)

## 2017-02-13 ENCOUNTER — Emergency Department (HOSPITAL_COMMUNITY): Payer: Medicare Other

## 2017-02-13 ENCOUNTER — Encounter (HOSPITAL_COMMUNITY): Payer: Self-pay | Admitting: Emergency Medicine

## 2017-02-13 ENCOUNTER — Inpatient Hospital Stay (HOSPITAL_COMMUNITY)
Admission: EM | Admit: 2017-02-13 | Discharge: 2017-02-19 | DRG: 470 | Disposition: A | Discharge status: Skilled Nursing Facility | Payer: Medicare Other | Attending: Family Medicine | Admitting: Family Medicine

## 2017-02-13 DIAGNOSIS — I5032 Chronic diastolic (congestive) heart failure: Secondary | ICD-10-CM | POA: Diagnosis present

## 2017-02-13 DIAGNOSIS — S72001A Fracture of unspecified part of neck of right femur, initial encounter for closed fracture: Secondary | ICD-10-CM | POA: Diagnosis not present

## 2017-02-13 DIAGNOSIS — N179 Acute kidney failure, unspecified: Secondary | ICD-10-CM

## 2017-02-13 DIAGNOSIS — I5030 Unspecified diastolic (congestive) heart failure: Secondary | ICD-10-CM | POA: Diagnosis not present

## 2017-02-13 DIAGNOSIS — R945 Abnormal results of liver function studies: Secondary | ICD-10-CM

## 2017-02-13 DIAGNOSIS — I350 Nonrheumatic aortic (valve) stenosis: Secondary | ICD-10-CM | POA: Diagnosis not present

## 2017-02-13 DIAGNOSIS — E871 Hypo-osmolality and hyponatremia: Secondary | ICD-10-CM | POA: Diagnosis not present

## 2017-02-13 DIAGNOSIS — I517 Cardiomegaly: Secondary | ICD-10-CM | POA: Diagnosis present

## 2017-02-13 DIAGNOSIS — N183 Chronic kidney disease, stage 3 unspecified: Secondary | ICD-10-CM

## 2017-02-13 DIAGNOSIS — R339 Retention of urine, unspecified: Secondary | ICD-10-CM | POA: Diagnosis not present

## 2017-02-13 DIAGNOSIS — R338 Other retention of urine: Secondary | ICD-10-CM

## 2017-02-13 DIAGNOSIS — Z01810 Encounter for preprocedural cardiovascular examination: Secondary | ICD-10-CM | POA: Diagnosis not present

## 2017-02-13 DIAGNOSIS — W1839XA Other fall on same level, initial encounter: Secondary | ICD-10-CM | POA: Diagnosis present

## 2017-02-13 DIAGNOSIS — S72001D Fracture of unspecified part of neck of right femur, subsequent encounter for closed fracture with routine healing: Secondary | ICD-10-CM

## 2017-02-13 DIAGNOSIS — Z794 Long term (current) use of insulin: Secondary | ICD-10-CM

## 2017-02-13 DIAGNOSIS — E119 Type 2 diabetes mellitus without complications: Secondary | ICD-10-CM

## 2017-02-13 DIAGNOSIS — Z96649 Presence of unspecified artificial hip joint: Secondary | ICD-10-CM | POA: Diagnosis not present

## 2017-02-13 DIAGNOSIS — E875 Hyperkalemia: Secondary | ICD-10-CM | POA: Diagnosis not present

## 2017-02-13 DIAGNOSIS — I272 Pulmonary hypertension, unspecified: Secondary | ICD-10-CM | POA: Diagnosis not present

## 2017-02-13 DIAGNOSIS — Z7982 Long term (current) use of aspirin: Secondary | ICD-10-CM | POA: Diagnosis not present

## 2017-02-13 DIAGNOSIS — K7689 Other specified diseases of liver: Secondary | ICD-10-CM | POA: Diagnosis not present

## 2017-02-13 DIAGNOSIS — W19XXXA Unspecified fall, initial encounter: Secondary | ICD-10-CM

## 2017-02-13 DIAGNOSIS — I130 Hypertensive heart and chronic kidney disease with heart failure and stage 1 through stage 4 chronic kidney disease, or unspecified chronic kidney disease: Secondary | ICD-10-CM | POA: Diagnosis not present

## 2017-02-13 DIAGNOSIS — D696 Thrombocytopenia, unspecified: Secondary | ICD-10-CM | POA: Diagnosis not present

## 2017-02-13 DIAGNOSIS — E1122 Type 2 diabetes mellitus with diabetic chronic kidney disease: Secondary | ICD-10-CM | POA: Diagnosis present

## 2017-02-13 DIAGNOSIS — Z79899 Other long term (current) drug therapy: Secondary | ICD-10-CM | POA: Diagnosis not present

## 2017-02-13 DIAGNOSIS — D631 Anemia in chronic kidney disease: Secondary | ICD-10-CM | POA: Diagnosis present

## 2017-02-13 HISTORY — DX: Nontoxic single thyroid nodule: E04.1

## 2017-02-13 HISTORY — DX: Cerebral infarction, unspecified: I63.9

## 2017-02-13 HISTORY — DX: Chronic diastolic (congestive) heart failure: I50.32

## 2017-02-13 HISTORY — DX: Unspecified dementia, unspecified severity, without behavioral disturbance, psychotic disturbance, mood disturbance, and anxiety: F03.90

## 2017-02-13 LAB — COMPREHENSIVE METABOLIC PANEL
ALK PHOS: 156 U/L — AB (ref 38–126)
ALT: 28 U/L (ref 14–54)
AST: 44 U/L — ABNORMAL HIGH (ref 15–41)
Albumin: 3.4 g/dL — ABNORMAL LOW (ref 3.5–5.0)
Anion gap: 9 (ref 5–15)
BUN: 32 mg/dL — ABNORMAL HIGH (ref 6–20)
CALCIUM: 9 mg/dL (ref 8.9–10.3)
CO2: 23 mmol/L (ref 22–32)
CREATININE: 2.13 mg/dL — AB (ref 0.44–1.00)
Chloride: 104 mmol/L (ref 101–111)
GFR, EST AFRICAN AMERICAN: 21 mL/min — AB (ref >60)
GFR, EST NON AFRICAN AMERICAN: 18 mL/min — AB (ref >60)
Glucose, Bld: 178 mg/dL — ABNORMAL HIGH (ref 65–99)
Potassium: 4.2 mmol/L (ref 3.5–5.1)
Sodium: 136 mmol/L (ref 135–145)
Total Bilirubin: 1.8 mg/dL — ABNORMAL HIGH (ref 0.3–1.2)
Total Protein: 8.3 g/dL — ABNORMAL HIGH (ref 6.5–8.1)

## 2017-02-13 LAB — CBC WITH DIFFERENTIAL/PLATELET
Basophils Absolute: 0 10*3/uL (ref 0.0–0.1)
Basophils Relative: 0 %
EOS PCT: 0 %
Eosinophils Absolute: 0 10*3/uL (ref 0.0–0.7)
HEMATOCRIT: 38.6 % (ref 36.0–46.0)
Hemoglobin: 12.5 g/dL (ref 12.0–15.0)
LYMPHS ABS: 0.4 10*3/uL — AB (ref 0.7–4.0)
LYMPHS PCT: 5 %
MCH: 31.6 pg (ref 26.0–34.0)
MCHC: 32.4 g/dL (ref 30.0–36.0)
MCV: 97.5 fL (ref 78.0–100.0)
Monocytes Absolute: 0.3 10*3/uL (ref 0.1–1.0)
Monocytes Relative: 5 %
Neutro Abs: 6.6 10*3/uL (ref 1.7–7.7)
Neutrophils Relative %: 90 %
Platelets: 129 10*3/uL — ABNORMAL LOW (ref 150–400)
RBC: 3.96 MIL/uL (ref 3.87–5.11)
RDW: 15.8 % — ABNORMAL HIGH (ref 11.5–15.5)
WBC: 7.4 10*3/uL (ref 4.0–10.5)

## 2017-02-13 MED ORDER — METOPROLOL SUCCINATE ER 50 MG PO TB24
50.0000 mg | ORAL_TABLET | Freq: Every day | ORAL | Status: DC
Start: 2017-02-14 — End: 2017-02-19
  Administered 2017-02-14 – 2017-02-19 (×6): 50 mg via ORAL
  Filled 2017-02-13 (×6): qty 1

## 2017-02-13 MED ORDER — LEVOTHYROXINE SODIUM 50 MCG PO TABS
50.0000 ug | ORAL_TABLET | Freq: Every day | ORAL | Status: DC
  Administered 2017-02-15 – 2017-02-17 (×3): 50 ug via ORAL
  Filled 2017-02-13 (×3): qty 1

## 2017-02-13 MED ORDER — HYDROCODONE-ACETAMINOPHEN 5-325 MG PO TABS: 1.0000 | ORAL_TABLET | Freq: Four times a day (QID) | ORAL | Status: DC | PRN

## 2017-02-13 MED ORDER — ISOSORB DINITRATE-HYDRALAZINE 20-37.5 MG PO TABS
1.0000 | ORAL_TABLET | Freq: Two times a day (BID) | ORAL | Status: DC
  Administered 2017-02-14: 1 via ORAL
  Filled 2017-02-13 (×2): qty 1

## 2017-02-13 MED ORDER — MORPHINE SULFATE (PF) 2 MG/ML IV SOLN
2.0000 mg | Freq: Once | INTRAVENOUS | Status: AC
  Administered 2017-02-13: 2 mg via INTRAMUSCULAR
  Filled 2017-02-13: qty 1

## 2017-02-13 MED ORDER — INSULIN GLARGINE 100 UNIT/ML ~~LOC~~ SOLN
5.0000 [IU] | Freq: Every day | SUBCUTANEOUS | Status: DC
  Administered 2017-02-15 – 2017-02-19 (×5): 5 [IU] via SUBCUTANEOUS
  Filled 2017-02-13 (×6): qty 0.05

## 2017-02-13 MED ORDER — SODIUM CHLORIDE 0.45 % IV SOLN
INTRAVENOUS | Status: AC
  Administered 2017-02-13: 23:00:00 via INTRAVENOUS

## 2017-02-13 MED ORDER — ADULT MULTIVITAMIN W/MINERALS CH
1.0000 | ORAL_TABLET | Freq: Every day | ORAL | Status: DC
  Administered 2017-02-15 – 2017-02-19 (×5): 1 via ORAL
  Filled 2017-02-13 (×5): qty 1

## 2017-02-13 MED ORDER — PANTOPRAZOLE SODIUM 40 MG PO TBEC
40.0000 mg | DELAYED_RELEASE_TABLET | Freq: Every day | ORAL | Status: DC
  Administered 2017-02-15 – 2017-02-19 (×5): 40 mg via ORAL
  Filled 2017-02-13 (×5): qty 1

## 2017-02-13 MED ORDER — MORPHINE SULFATE (PF) 2 MG/ML IV SOLN
INTRAVENOUS | Status: AC
  Filled 2017-02-13: qty 1

## 2017-02-13 MED ORDER — MORPHINE SULFATE (PF) 4 MG/ML IV SOLN: 0.5000 mg | INTRAVENOUS | Status: DC | PRN

## 2017-02-13 MED ORDER — INSULIN ASPART 100 UNIT/ML ~~LOC~~ SOLN
0.0000 [IU] | Freq: Three times a day (TID) | SUBCUTANEOUS | Status: DC
  Administered 2017-02-15 – 2017-02-16 (×3): 3 [IU] via SUBCUTANEOUS
  Administered 2017-02-16: 2 [IU] via SUBCUTANEOUS
  Administered 2017-02-16 – 2017-02-17 (×3): 3 [IU] via SUBCUTANEOUS
  Administered 2017-02-18 (×3): 2 [IU] via SUBCUTANEOUS

## 2017-02-13 MED ORDER — MORPHINE SULFATE (PF) 2 MG/ML IV SOLN
2.0000 mg | Freq: Once | INTRAVENOUS | Status: AC
  Administered 2017-02-13: 2 mg via INTRAVENOUS

## 2017-02-13 MED ORDER — ALBUTEROL SULFATE (2.5 MG/3ML) 0.083% IN NEBU
2.5000 mg | INHALATION_SOLUTION | Freq: Four times a day (QID) | RESPIRATORY_TRACT | Status: DC | PRN
Start: 2017-02-13 — End: 2017-02-19
  Administered 2017-02-14: 2.5 mg via RESPIRATORY_TRACT
  Filled 2017-02-13: qty 3

## 2017-02-13 MED ORDER — MORPHINE SULFATE (PF) 2 MG/ML IV SOLN: 4.0000 mg | Freq: Once | INTRAVENOUS | Status: DC

## 2017-02-13 MED ORDER — ASPIRIN 81 MG PO CHEW: 81.0000 mg | CHEWABLE_TABLET | Freq: Every day | ORAL | Status: DC

## 2017-02-13 MED ORDER — SENNA 8.6 MG PO TABS
1.0000 | ORAL_TABLET | Freq: Two times a day (BID) | ORAL | Status: DC
  Administered 2017-02-14 – 2017-02-19 (×7): 8.6 mg via ORAL
  Filled 2017-02-13 (×11): qty 1

## 2017-02-13 NOTE — Consult Note (Signed)
Reason for Consult:right hip fracture Referring Physician: [name] is an 83 y.o. female.  HPI: 83 year old female who lives with her family at home who fellgetting off of the toilet today. She was unable to walk afterwards and taken to the hospital where she was found to havea displaced femoral neck fracture. She has pain with any motion or weightbearing.  Past Medical History:  Diagnosis Date  . CKD (chronic kidney disease) stage 3, GFR 30-59 ml/min 06/19/2016  . Diabetes mellitus   . GERD (gastroesophageal reflux disease) 06/19/2016  . Hypertension     History reviewed. No pertinent surgical history.  No family history on file.  Social History:  reports that she has never smoked. She has never used smokeless tobacco. She reports that she does not drink alcohol or use drugs.  Allergies: No Known Allergies  Medications: I have reviewed the patient's current medications.  Results for orders placed or performed during the hospital encounter of 02/13/17 (from the past 48 hour(s))  CBC with Differential/Platelet     Status: Abnormal   Collection Time: 02/13/17  2:23 PM  Result Value Ref Range   WBC 7.4 4.0 - 10.5 K/uL   RBC 3.96 3.87 - 5.11 MIL/uL   Hemoglobin 12.5 12.0 - 15.0 g/dL   HCT 38.6 36.0 - 46.0 %   MCV 97.5 78.0 - 100.0 fL   MCH 31.6 26.0 - 34.0 pg   MCHC 32.4 30.0 - 36.0 g/dL   RDW 15.8 (H) 11.5 - 15.5 %   Platelets 129 (L) 150 - 400 K/uL   Neutrophils Relative % 90 %   Neutro Abs 6.6 1.7 - 7.7 K/uL   Lymphocytes Relative 5 %   Lymphs Abs 0.4 (L) 0.7 - 4.0 K/uL   Monocytes Relative 5 %   Monocytes Absolute 0.3 0.1 - 1.0 K/uL   Eosinophils Relative 0 %   Eosinophils Absolute 0.0 0.0 - 0.7 K/uL   Basophils Relative 0 %   Basophils Absolute 0.0 0.0 - 0.1 K/uL  Comprehensive metabolic panel     Status: Abnormal   Collection Time: 02/13/17  2:23 PM  Result Value Ref Range   Sodium 136 135 - 145 mmol/L   Potassium 4.2 3.5 - 5.1 mmol/L   Chloride  104 101 - 111 mmol/L   CO2 23 22 - 32 mmol/L   Glucose, Bld 178 (H) 65 - 99 mg/dL   BUN 32 (H) 6 - 20 mg/dL   Creatinine, Ser 2.13 (H) 0.44 - 1.00 mg/dL   Calcium 9.0 8.9 - 10.3 mg/dL   Total Protein 8.3 (H) 6.5 - 8.1 g/dL   Albumin 3.4 (L) 3.5 - 5.0 g/dL   AST 44 (H) 15 - 41 U/L   ALT 28 14 - 54 U/L   Alkaline Phosphatase 156 (H) 38 - 126 U/L   Total Bilirubin 1.8 (H) 0.3 - 1.2 mg/dL   GFR calc non Af Amer 18 (L) >60 mL/min   GFR calc Af Amer 21 (L) >60 mL/min    Comment: (NOTE) The eGFR has been calculated using the CKD EPI equation. This calculation has not been validated in all clinical situations. eGFR's persistently <60 mL/min signify possible Chronic Kidney Disease.    Anion gap 9 5 - 15    Dg Chest 1 View  Result Date: 02/13/2017 CLINICAL DATA:  Fall. EXAM: CHEST 1 VIEW COMPARISON:  06/19/2016 FINDINGS: New cardiopericardial enlargement with possible pericardial effusion morphology. New pleural effusion on the right that is small  volume. Vascular congestion. No air bronchogram or pneumothorax. Osteopenia and diffuse spondylosis. No acute osseous finding. IMPRESSION: 1. Cardiopericardial enlargement that is new from 2017. There could be a pericardial effusion based on the shape. 2. Small volume right pleural fluid. 3. Pulmonary vascular congestion. Electronically Signed   By: Monte Fantasia M.D.   On: 02/13/2017 15:04   Dg Knee Complete 4 Views Right  Result Date: 02/13/2017 CLINICAL DATA:  Right knee pain after fall today. EXAM: RIGHT KNEE - COMPLETE 4+ VIEW COMPARISON:  None. FINDINGS: No evidence of fracture, dislocation, or joint effusion. Severe narrowing of the medial and lateral joint spaces is noted. Spurring of the patella is noted. Vascular calcifications are noted. IMPRESSION: Severe degenerative joint disease. No acute abnormality seen in the right knee. Electronically Signed   By: Marijo Conception, M.D.   On: 02/13/2017 15:05   Dg Hip Unilat W Or Wo Pelvis 2-3 Views  Right  Result Date: 02/13/2017 CLINICAL DATA:  Fall.  Right hip and knee pain.  Initial encounter. EXAM: DG HIP (WITH OR WITHOUT PELVIS) 2-3V RIGHT COMPARISON:  None. FINDINGS: The right femur appears foreshortened and is in external rotation with appearance compatible with a femoral neck fracture. Detailed assessment is limited by external rotation. The right femoral head remains seated in the acetabulum. Vascular calcifications are noted. IMPRESSION: Right femoral neck fracture. Electronically Signed   By: Logan Bores M.D.   On: 02/13/2017 15:10    Review of Systems  Unable to perform ROS: Language   Blood pressure (!) 121/97, pulse 77, temperature 97.7 F (36.5 C), temperature source Oral, resp. rate 20, SpO2 93 %. Physical Exam  Constitutional: She appears well-nourished.  HENT:  Head: Atraumatic.  Respiratory: Effort normal.  Musculoskeletal:  Right lower extremity is shortened and externally rotated.  Skin is intact at the hip and distally. Skin over the pretibial area is somewhat shiny  And hairless consistent with some peripheral vascular disease likely.  Neurological:  Sleepy  Skin: Skin is warm and dry.    Assessment/Plan: Right displaced femoral neck fracture in an elderly female with multiple medical comorbidities I spoke with the family about treatment options.  If she is healthy enough to proceed with surgery I would recommend hemiarthroplasty to try and increase chances that she will be comfortable and hopefully be able to walk again.  This also minimizes potential complications of bedrest. She apparently has significant cardiac enlargement On x-ray. This will be evaluated with echo to help further determine surgical risk and whether any further intervention will need to be done prior to surgery. For now she will tentatively be put on the schedule for tomorrow and n.p.o. after midnight.    Cynthia Todd 02/13/2017, 5:27 PM

## 2017-02-13 NOTE — ED Notes (Signed)
Attempt IV twice unsuccessful to right AC and right hand.

## 2017-02-13 NOTE — ED Notes (Addendum)
Attempt to call report to 5N; secretary verbalizes will have RN call [phone] and ask to speak with this Clinical research associatewriter when available.

## 2017-02-13 NOTE — ED Notes (Signed)
With pt assessment pt and daughter verbalize pt was trying to sit down in chair, missed chair causing fall. Pt to from right hip to right knee; right pedal pulse noted; pt denies numbness. Pt denies headache, dizziness, or other symptoms causing fall. Daughter states pt took hypertension medications this morning. Pt noted to be hypertensive with triage. Pt moans with movement of right leg. Splint to right knee placed by EMS to immobilize joint.

## 2017-02-13 NOTE — ED Notes (Signed)
[facility] here to transport pt to [facility]. 

## 2017-02-13 NOTE — H&P (Addendum)
[facility] History and Physical  Cynthia Todd ZOX:[id number] DOB: 05/22/1919 DOA: 02/13/2017   PCP: Fleet Contras, MD    Chief Complaint: Fall with right hip pain  HPI: Cynthia Todd is a 83 y.o. female with a past medical history of chronic diastolic CHF, diabetes, who had a mechanical fall earlier today resulting in pain in the right hip as well as knee. Patient does not speak any English, so not much history was available. She was noted to be grimacing and so was thought to be in pain. No chest pain or shortness of breath. History is very limited.  Home Medications: Prior to Admission medications   Medication Sig Start Date End Date Taking? Authorizing Provider  albuterol (PROVENTIL HFA;VENTOLIN HFA) 108 (90 Base) MCG/ACT inhaler Inhale 2 puffs into the lungs every 6 (six) hours as needed for wheezing or shortness of breath.   Yes [provider]  albuterol (PROVENTIL) (2.5 MG/3ML) 0.083% nebulizer solution Take 2.5 mg by nebulization every 6 (six) hours as needed for wheezing or shortness of breath.   Yes [provider]  aspirin 81 MG tablet Take 1 tablet (81 mg total) by mouth daily. Patient taking differently: Take 81 mg by mouth daily as needed for pain or fever. Does not take daily - Takes as needed for pain 11/20/14  Yes Penny Pia, MD  cetirizine (ZYRTEC) 10 MG tablet Take 10 mg by mouth daily as needed for allergies.   Yes [provider]  furosemide (LASIX) 20 MG tablet Take 20 mg by mouth daily as needed for fluid.   Yes [provider]  ibuprofen (ADVIL,MOTRIN) 200 MG tablet Take 200 mg by mouth every 6 (six) hours as needed for fever, headache, mild pain, moderate pain or cramping.   Yes [provider]  insulin glargine (LANTUS) 100 UNIT/ML injection Inject 0.25 mLs (25 Units total) into the skin daily. Patient taking differently: Inject 10 Units into the skin daily. Only given insulin if sugar is "bad" - not given daily  11/20/14  Yes Penny Pia, MD  isosorbide-hydrALAZINE (BIDIL) 20-37.5 MG tablet Take 2 tablets by mouth 2 (two) times daily. Takes at least once daily. Only sometimes takes twice daily Patient taking differently: Take 1 tablet by mouth 2 (two) times daily. Takes at least once daily. Only sometimes takes twice daily 06/20/16  Yes Elgergawy, Leana Roe, MD  levothyroxine (SYNTHROID, LEVOTHROID) 50 MCG tablet Take 50 mcg by mouth daily before breakfast.   Yes [provider]  losartan-hydrochlorothiazide (HYZAAR) 50-12.5 MG tablet Take 1 tablet by mouth daily.   Yes [provider]  metoprolol succinate (TOPROL-XL) 50 MG 24 hr tablet Take 50 mg by mouth daily. Take with or immediately following a meal.   Yes [provider]  Multiple Vitamin (MULTIVITAMIN WITH MINERALS) TABS tablet Take 1 tablet by mouth daily.   Yes [provider]  omeprazole (PRILOSEC) 20 MG capsule Take 20 mg by mouth 2 (two) times daily. 04/15/16  Yes [provider]  vitamin B-12 (CYANOCOBALAMIN) 250 MCG tablet Take 1 tablet (250 mcg total) by mouth daily. Patient not taking: Reported on 02/13/2017 06/20/16   Elgergawy, Leana Roe, MD    Allergies: No Known Allergies  Past Medical History: Past Medical History:  Diagnosis Date  . CKD (chronic kidney disease) stage 3, GFR 30-59 ml/min 06/19/2016  . Diabetes mellitus   . GERD (gastroesophageal reflux disease) 06/19/2016  . Hypertension     History reviewed. No pertinent surgical history.  Social History:  Social History   Social History  . Marital status: Widowed    Spouse name: N/A  . Number of children: N/A  . Years of education: N/A   Occupational History  . Not on file.   Social History Main Topics  . Smoking status: Never Smoker  . Smokeless tobacco: Never Used  . Alcohol use No  . Drug use: No  . Sexual activity: No   Other Topics Concern  . Not on file   Social History Narrative  . No narrative on file     Family History: Unable to do due to language barriers. No interpreter available.  Review of Systems - unable to do due to language barriers. No interpreter available  Physical Examination  Vitals:   02/13/17 1324 02/13/17 1506 02/13/17 1541 02/13/17 1708  BP: (!) 232/101 (!) 153/133 (!) 188/85 (!) 121/97  Pulse:  80 78 77  Resp:  20 20 20   Temp:    97.7 F (36.5 C)  TempSrc:    Oral  SpO2:  99% 98% 93%    BP (!) 121/97 (BP Location: Right Arm)   Pulse 77   Temp 97.7 F (36.5 C) (Oral)   Resp 20   SpO2 93%   General appearance: alert, cooperative, appears stated age and no distress Head: Normocephalic, without obvious abnormality, atraumatic Eyes: conjunctivae/corneas clear. PERRL, EOM's intact. Throat: lips, mucosa, and tongue normal; teeth and gums normal Resp: clear to auscultation bilaterally Cardio: regular rate and rhythm, S1, S2 normal, no murmur, click, rub or gallop GI: soft, non-tender; bowel sounds normal; no masses,  no organomegaly Extremities: Right leg is externally rotated Pulses: 2+ and symmetric Skin: Skin color, texture, turgor normal. No rashes or lesions Neurologic: No focal deficits   Labs on Admission: I have personally reviewed following labs and imaging studies  CBC:  Recent Labs Lab 02/13/17 1423  WBC 7.4  NEUTROABS 6.6  HGB 12.5  HCT 38.6  MCV 97.5  PLT 129*   Basic Metabolic Panel:  Recent Labs Lab 02/13/17 1423  NA 136  K 4.2  CL 104  CO2 23  GLUCOSE 178*  BUN 32*  CREATININE 2.13*  CALCIUM 9.0   GFR: CrCl cannot be calculated (Unknown ideal weight.). Liver Function Tests:  Recent Labs Lab 02/13/17 1423  AST 44*  ALT 28  ALKPHOS 156*  BILITOT 1.8*  PROT 8.3*  ALBUMIN 3.4*    Radiological Exams on Admission: Dg Chest 1 View  Result Date: 02/13/2017 CLINICAL DATA:  Fall. EXAM: CHEST 1 VIEW COMPARISON:  06/19/2016 FINDINGS: New cardiopericardial enlargement with possible pericardial effusion morphology.  New pleural effusion on the right that is small volume. Vascular congestion. No air bronchogram or pneumothorax. Osteopenia and diffuse spondylosis. No acute osseous finding. IMPRESSION: 1. Cardiopericardial enlargement that is new from 2017. There could be a pericardial effusion based on the shape. 2. Small volume right pleural fluid. 3. Pulmonary vascular congestion. Electronically Signed   By: Marnee SpringJonathon  Watts M.D.   On: 02/13/2017 15:04   Dg Knee Complete 4 Views Right  Result Date: 02/13/2017 CLINICAL DATA:  Right knee pain after fall today. EXAM: RIGHT KNEE - COMPLETE 4+ VIEW COMPARISON:  None. FINDINGS: No evidence of fracture, dislocation, or joint effusion. Severe narrowing of the medial and lateral joint spaces is noted. Spurring of the patella is noted. Vascular calcifications are noted. IMPRESSION: Severe degenerative joint disease. No acute abnormality seen in the right knee. Electronically Signed   By: Roque LiasJames  Green  Montez Hageman, M.D.   On: 02/13/2017 15:05   Dg Hip Unilat W Or Wo Pelvis 2-3 Views Right  Result Date: 02/13/2017 CLINICAL DATA:  Fall.  Right hip and knee pain.  Initial encounter. EXAM: DG HIP (WITH OR WITHOUT PELVIS) 2-3V RIGHT COMPARISON:  None. FINDINGS: The right femur appears foreshortened and is in external rotation with appearance compatible with a femoral neck fracture. Detailed assessment is limited by external rotation. The right femoral head remains seated in the acetabulum. Vascular calcifications are noted. IMPRESSION: Right femoral neck fracture. Electronically Signed   By: Sebastian Ache M.D.   On: 02/13/2017 15:10    My interpretation of Electrocardiogram: Sinus rhythm with baseline wander. No ischemic changes noted.   Problem List  Active Problems:   Diabetes mellitus (HCC)   Diastolic heart failure, NYHA class 1 (HCC)   Pulmonary HTN (HCC)   CKD (chronic kidney disease) stage 3, GFR 30-59 ml/min   Closed right hip fracture (HCC)   Cardiomegaly   Assessment:  This is a 83 year old female who had a mechanical fall resulting in right hip fracture  Plan: #1. Right hip fracture: Orthopedics has been consulted. Dr. Ave Filter wanted her to go to surgery today. However, chest x-ray shows new cardiomegaly compared to 2017. This was discussed with Dr. Rennis Golden with cardiology. Would prefer to have an echocardiogram done before clearing her for surgery. This will be ordered as soon as possible. Orthopedic surgeon also requested transferred to [facility], which will be done tonight. Pain control. Once echocardiogram has been done findings may have to be discussed further with cardiology before clearing the patient for surgery. This has been communicated through to Dr. Ave Filter.  #2. Cardiomegaly: This is new compared to x-ray done last year. She could have pericardial effusion. Echocardiogram from last year showed normal systolic function. Diastolic dysfunction was noted. No pericardial effusion was noted at that time.  #3 Chronic diastolic congestive heart failure: She appears to be euvolemic. Monitor volume status closely. Continue her other cardiac medications.  #4 history of chronic kidney disease, stage III: Creatinine appears to be higher than her previous value, which was 1.3 in October. She'll be gently hydrated. Repeat renal function tests tomorrow. Monitor urine output. Hold her ARB and diuretic for now.  #5 Diabetes mellitus type II: She is on Lantus. The dose will be reduced to half. Sliding scale insulin coverage.   DVT Prophylaxis: SCDs for now Code Status: Full code Family Communication: No family at bedside  Consults called: Orthopedics  Admission status: Inpatient   Severity of Illness: The appropriate patient status for this patient is INPATIENT. Inpatient status is judged to be reasonable and necessary in order to provide the required intensity of service to ensure the patient's safety. The patient's presenting symptoms, physical exam findings,  and initial radiographic and laboratory data in the context of their chronic comorbidities is felt to place them at high risk for further clinical deterioration. Furthermore, it is not anticipated that the patient will be medically stable for discharge from the hospital within 2 midnights of admission. The following factors support the patient status of inpatient.   " The patient's presenting symptoms include fall with hip fracture. " The worrisome physical exam findings include cardiomegaly. " The initial radiographic and laboratory data are worrisome because of cardiomegaly. " The chronic co-morbidities include diabetes.   * I certify that at the point of admission it is my clinical judgment that the patient will require inpatient hospital care spanning beyond 2 midnights  from the point of admission due to high intensity of service, high risk for further deterioration and high frequency of surveillance required.*    Further management decisions will depend on results of further testing and patient's response to treatment.   [facility]  [facility] Pager [phone]  If 7PM-7AM, please contact night-coverage [email or web address] Password [facility]  02/13/2017, 6:14 PM

## 2017-02-13 NOTE — ED Provider Notes (Signed)
Cynthia Todd is a 83 y.o. female, with a history of CKD, DM, GERD, and HTN, presenting to the ED with injuries from a fall that occurred just prior to arrival.    HPI from Trisha Mangle, PA-C: "Fall  This is a new problem. The current episode started less than 1 hour ago. The problem occurs constantly. The problem has been gradually worsening. Nothing aggravates the symptoms. Nothing relieves the symptoms. She has tried nothing for the symptoms.  Knee Pain    Pt fell out of a chair and hit right hip and right knee.  Pt complains of severe pain.  Pt unable to stand and walk,  Pt has a history of diabetes and high blood pressure."  Results for orders placed or performed during the hospital encounter of 02/13/17  CBC with Differential/Platelet  Result Value Ref Range   WBC 7.4 4.0 - 10.5 K/uL   RBC 3.96 3.87 - 5.11 MIL/uL   Hemoglobin 12.5 12.0 - 15.0 g/dL   HCT 82.9 56.2 - 13.0 %   MCV 97.5 78.0 - 100.0 fL   MCH 31.6 26.0 - 34.0 pg   MCHC 32.4 30.0 - 36.0 g/dL   RDW 86.5 (H) 78.4 - 69.6 %   Platelets 129 (L) 150 - 400 K/uL   Neutrophils Relative % 90 %   Neutro Abs 6.6 1.7 - 7.7 K/uL   Lymphocytes Relative 5 %   Lymphs Abs 0.4 (L) 0.7 - 4.0 K/uL   Monocytes Relative 5 %   Monocytes Absolute 0.3 0.1 - 1.0 K/uL   Eosinophils Relative 0 %   Eosinophils Absolute 0.0 0.0 - 0.7 K/uL   Basophils Relative 0 %   Basophils Absolute 0.0 0.0 - 0.1 K/uL  Comprehensive metabolic panel  Result Value Ref Range   Sodium 136 135 - 145 mmol/L   Potassium 4.2 3.5 - 5.1 mmol/L   Chloride 104 101 - 111 mmol/L   CO2 23 22 - 32 mmol/L   Glucose, Bld 178 (H) 65 - 99 mg/dL   BUN 32 (H) 6 - 20 mg/dL   Creatinine, Ser 2.95 (H) 0.44 - 1.00 mg/dL   Calcium 9.0 8.9 - 28.4 mg/dL   Total Protein 8.3 (H) 6.5 - 8.1 g/dL   Albumin 3.4 (L) 3.5 - 5.0 g/dL   AST 44 (H) 15 - 41 U/L   ALT 28 14 - 54 U/L   Alkaline Phosphatase 156 (H) 38 - 126 U/L   Total Bilirubin 1.8 (H) 0.3 - 1.2 mg/dL   GFR calc non Af  Amer 18 (L) >60 mL/min   GFR calc Af Amer 21 (L) >60 mL/min   Anion gap 9 5 - 15   Dg Chest 1 View  Result Date: 02/13/2017 CLINICAL DATA:  Fall. EXAM: CHEST 1 VIEW COMPARISON:  06/19/2016 FINDINGS: New cardiopericardial enlargement with possible pericardial effusion morphology. New pleural effusion on the right that is small volume. Vascular congestion. No air bronchogram or pneumothorax. Osteopenia and diffuse spondylosis. No acute osseous finding. IMPRESSION: 1. Cardiopericardial enlargement that is new from 2017. There could be a pericardial effusion based on the shape. 2. Small volume right pleural fluid. 3. Pulmonary vascular congestion. Electronically Signed   By: Marnee Spring M.D.   On: 02/13/2017 15:04   Dg Knee Complete 4 Views Right  Result Date: 02/13/2017 CLINICAL DATA:  Right knee pain after fall today. EXAM: RIGHT KNEE - COMPLETE 4+ VIEW COMPARISON:  None. FINDINGS: No evidence of fracture, dislocation, or joint effusion. Severe  narrowing of the medial and lateral joint spaces is noted. Spurring of the patella is noted. Vascular calcifications are noted. IMPRESSION: Severe degenerative joint disease. No acute abnormality seen in the right knee. Electronically Signed   By: Lupita RaiderJames  Green Jr, M.D.   On: 02/13/2017 15:05   Dg Hip Unilat W Or Wo Pelvis 2-3 Views Right  Result Date: 02/13/2017 CLINICAL DATA:  Fall.  Right hip and knee pain.  Initial encounter. EXAM: DG HIP (WITH OR WITHOUT PELVIS) 2-3V RIGHT COMPARISON:  None. FINDINGS: The right femur appears foreshortened and is in external rotation with appearance compatible with a femoral neck fracture. Detailed assessment is limited by external rotation. The right femoral head remains seated in the acetabulum. Vascular calcifications are noted. IMPRESSION: Right femoral neck fracture. Electronically Signed   By: Sebastian AcheAllen  Grady M.D.   On: 02/13/2017 15:10    Clinical Course as of Feb 13 1713  Fri Feb 13, 2017  1603 Spoke with Dr.  Ave Filterhandler, orthopedic surgeon, who [facility]. Would like patient transferred directly to the holding area for the OR "as quickly as possible." Requests that I check with Dr. Rito EhrlichKrishnan to assure he is ok with the patient going directly to holding area.  [SJ]  1627 Spoke with Dr. Rito EhrlichKrishnan, hospitalist, to update him on Dr. Veda Canninghandler's request. Requests that we hold the transfer because he is not sure patient is fit for surgery today. He will contact Dr. Ave Filterhandler directly and set up the transfer if it is still relevant. I halted the transfer process that had already been put into motion based on Dr. Veda Canninghandler's request.  [SJ]    Clinical Course User Index [SJ] Anselm PancoastJoy, Jenelle Drennon C, [place]PA-C   Vitals:   02/13/17 1324 02/13/17 1506 02/13/17 1541 02/13/17 1708  BP: (!) 232/101 (!) 153/133 (!) 188/85 (!) 121/97  Pulse:  80 78 77  Resp:  20 20 20   Temp:    97.7 F (36.5 C)  TempSrc:    Oral  SpO2:  99% 98% 93%       Anselm PancoastJoy, Keshana Klemz C, PA-C 02/13/17 1712    Anselm PancoastJoy, Tarrance Januszewski C, PA-C 02/13/17 1714    Rolland PorterJames, Mark, MD 02/16/17 [phone]

## 2017-02-13 NOTE — ED Provider Notes (Addendum)
Pt seen and evaluated. D/W K. Sofia Pa-C. Pt fell from chair--ground. C/O right knee and right hip pain. RLE held with hip ext rotated , and knee flexed. In marked pain. Plan IV pain meds, XR, further eval.  Per daughter, no symptoms leading to fall. "just didn't sit down right".   Will need pre-op eval as patient with history of Pulm htn, chf, dm, CKD, etc.   Cynthia Todd, Cynthia Knotek, MD 02/13/17 1406    Cynthia Todd, Cynthia Minahan, MD 02/13/17 [phone]

## 2017-02-13 NOTE — ED Triage Notes (Signed)
Per EMS pt tripped outside resulting in right knee pain; no LOC or blood thinner use.

## 2017-02-13 NOTE — ED Notes (Signed)
Bed: [id number] Expected date:  Expected time:  Means of arrival:  Comments: EMS-fall-knee pain

## 2017-02-13 NOTE — ED Notes (Signed)
Attempt to call 5N to acquire if RN is ready to receive report; per secretary awaiting charge RN to assign pt to a Charity fundraiserN.

## 2017-02-13 NOTE — ED Provider Notes (Signed)
[facility]-EMERGENCY DEPT Provider Note   CSN: [id number] Arrival date & time: 02/13/17  1310     History   Chief Complaint Chief Complaint  Patient presents with  . Fall  . Knee Pain    HPI Cynthia Todd is a 83 y.o. female.  The history is provided by the patient. No language interpreter was used.  Fall  This is a new problem. The current episode started less than 1 hour ago. The problem occurs constantly. The problem has been gradually worsening. Nothing aggravates the symptoms. Nothing relieves the symptoms. She has tried nothing for the symptoms.  Knee Pain     Pt fell out of a chair and hit right hip and right knee.  Pt complains of severe pain.  Pt unable to stand and walk,  Pt has a history of diabetes and high blood pressure.  Past Medical History:  Diagnosis Date  . CKD (chronic kidney disease) stage 3, GFR 30-59 ml/min 06/19/2016  . Diabetes mellitus   . GERD (gastroesophageal reflux disease) 06/19/2016  . Hypertension     Patient Active Problem List   Diagnosis Date Noted  . Syncope 06/19/2016  . GERD (gastroesophageal reflux disease) 06/19/2016  . CKD (chronic kidney disease) stage 3, GFR 30-59 ml/min 06/19/2016  . Hypertensive urgency 11/17/2014  . Diabetic nephropathy (HCC) 06/15/2013  . Acute respiratory failure (HCC) 06/15/2013  . Insulin dependent diabetes mellitus (HCC) 06/14/2013  . Acute on chronic diastolic CHF (congestive heart failure) (HCC) 06/13/2013  . Influenza 10/02/2011  . Diastolic heart failure, NYHA class 1 (HCC) 09/29/2011  . Pulmonary HTN (HCC) 09/29/2011  . Shortness of breath 09/28/2011  . Hypokalemia 09/28/2011  . Chest pain on breathing 09/28/2011  . Diabetes mellitus (HCC) 09/28/2011  . Malignant hypertension 09/28/2011    History reviewed. No pertinent surgical history.  OB History    No data available       Home Medications    Prior to Admission medications   Medication Sig Start Date End Date Taking? Authorizing  Provider  aspirin 81 MG tablet Take 1 tablet (81 mg total) by mouth daily. Patient taking differently: Take 81 mg by mouth daily as needed. Does not take daily - Takes as needed for pain 11/20/14   Penny Pia, MD  glimepiride (AMARYL) 4 MG tablet Take 4 mg by mouth daily. 05/13/16   [provider]  hydroxypropyl methylcellulose / hypromellose (ISOPTO TEARS / GONIOVISC) 2.5 % ophthalmic solution Place 3 drops into both eyes 3 (three) times daily. For dry eyes    [provider]  insulin glargine (LANTUS) 100 UNIT/ML injection Inject 0.25 mLs (25 Units total) into the skin daily. Patient taking differently: Inject 25 Units into the skin daily. Only given insulin if sugar is "bad" - not given daily 11/20/14   Penny Pia, MD  isosorbide-hydrALAZINE (BIDIL) 20-37.5 MG tablet Take 2 tablets by mouth 2 (two) times daily. Takes at least once daily. Only sometimes takes twice daily 06/20/16   Elgergawy, Leana Roe, MD  metoprolol succinate (TOPROL-XL) 50 MG 24 hr tablet Take 50 mg by mouth daily. Take with or immediately following a meal.    [provider]  omeprazole (PRILOSEC) 20 MG capsule Take 20 mg by mouth 2 (two) times daily. 04/15/16   [provider]  vitamin B-12 (CYANOCOBALAMIN) 250 MCG tablet Take 1 tablet (250 mcg total) by mouth daily. 06/20/16   Elgergawy, Leana Roe, MD    Family History No family history on file.  Social  History Social History  Substance Use Topics  . Smoking status: Never Smoker  . Smokeless tobacco: Never Used  . Alcohol use No     Allergies   Patient has no known allergies.   Review of Systems Review of Systems  All other systems reviewed and are negative.    Physical Exam Updated Vital Signs BP (!) 232/101 (BP Location: Left Arm)   Pulse 87   Temp 98.1 F (36.7 C) (Oral)   Resp (!) 22   SpO2 97%   Physical Exam  Constitutional: She appears well-developed and well-nourished. No distress.  HENT:  Head:  Normocephalic and atraumatic.  Eyes: Conjunctivae are normal.  Neck: Neck supple.  Cardiovascular: Normal rate and regular rhythm.   No murmur heard. Pulmonary/Chest: Effort normal and breath sounds normal. No respiratory distress.  Abdominal: Soft. There is no tenderness.  Musculoskeletal: She exhibits no edema.  Deformity right hip and right knee  nv and ns intact  Neurological: She is alert.  Skin: Skin is warm and dry.  Psychiatric: She has a normal mood and affect.  Nursing note and vitals reviewed.    ED Treatments / Results  Labs (all labs ordered are listed, but only abnormal results are displayed) Labs Reviewed  CBC WITH DIFFERENTIAL/PLATELET - Abnormal; Notable for the following:       Result Value   RDW 15.8 (*)    Platelets 129 (*)    Lymphs Abs 0.4 (*)    All other components within normal limits  COMPREHENSIVE METABOLIC PANEL - Abnormal; Notable for the following:    Glucose, Bld 178 (*)    BUN 32 (*)    Creatinine, Ser 2.13 (*)    Total Protein 8.3 (*)    Albumin 3.4 (*)    AST 44 (*)    Alkaline Phosphatase 156 (*)    Total Bilirubin 1.8 (*)    GFR calc non Af Amer 18 (*)    GFR calc Af Amer 21 (*)    All other components within normal limits    EKG  EKG Interpretation None       Radiology Dg Chest 1 View  Result Date: 02/13/2017 CLINICAL DATA:  Fall. EXAM: CHEST 1 VIEW COMPARISON:  06/19/2016 FINDINGS: New cardiopericardial enlargement with possible pericardial effusion morphology. New pleural effusion on the right that is small volume. Vascular congestion. No air bronchogram or pneumothorax. Osteopenia and diffuse spondylosis. No acute osseous finding. IMPRESSION: 1. Cardiopericardial enlargement that is new from 2017. There could be a pericardial effusion based on the shape. 2. Small volume right pleural fluid. 3. Pulmonary vascular congestion. Electronically Signed   By: Marnee SpringJonathon  Watts M.D.   On: 02/13/2017 15:04   Dg Knee Complete 4 Views  Right  Result Date: 02/13/2017 CLINICAL DATA:  Right knee pain after fall today. EXAM: RIGHT KNEE - COMPLETE 4+ VIEW COMPARISON:  None. FINDINGS: No evidence of fracture, dislocation, or joint effusion. Severe narrowing of the medial and lateral joint spaces is noted. Spurring of the patella is noted. Vascular calcifications are noted. IMPRESSION: Severe degenerative joint disease. No acute abnormality seen in the right knee. Electronically Signed   By: Lupita RaiderJames  Green Jr, M.D.   On: 02/13/2017 15:05   Dg Hip Unilat W Or Wo Pelvis 2-3 Views Right  Result Date: 02/13/2017 CLINICAL DATA:  Fall.  Right hip and knee pain.  Initial encounter. EXAM: DG HIP (WITH OR WITHOUT PELVIS) 2-3V RIGHT COMPARISON:  None. FINDINGS: The right femur appears foreshortened and is  in external rotation with appearance compatible with a femoral neck fracture. Detailed assessment is limited by external rotation. The right femoral head remains seated in the acetabulum. Vascular calcifications are noted. IMPRESSION: Right femoral neck fracture. Electronically Signed   By: Sebastian Ache M.D.   On: 02/13/2017 15:10    Procedures Procedures (including critical care time)  Medications Ordered in ED Medications  morphine 2 MG/ML injection 4 mg (not administered)     Initial Impression / Assessment and Plan / ED Course  I have reviewed the triage vital signs and the nursing notes.  Pertinent labs & imaging results that were available during my care of the patient were reviewed by me and considered in my medical decision making (see chart for details).       Final Clinical Impressions(s) / ED Diagnoses   Final diagnoses:  Closed fracture of neck of right femur, initial encounter [facility])    New Prescriptions New Prescriptions   No medications on file   Consult to hospitalist for admission. Orthopaedist consulted for fracture   Osie Cheeks 02/13/17 1523    Rolland Porter, MD 02/18/17 1055

## 2017-02-14 ENCOUNTER — Inpatient Hospital Stay (HOSPITAL_COMMUNITY): Payer: Medicare Other

## 2017-02-14 ENCOUNTER — Inpatient Hospital Stay (HOSPITAL_COMMUNITY): Payer: Medicare Other | Admitting: Certified Registered"

## 2017-02-14 ENCOUNTER — Encounter (HOSPITAL_COMMUNITY): Payer: Self-pay | Admitting: Internal Medicine

## 2017-02-14 ENCOUNTER — Encounter (HOSPITAL_COMMUNITY)
Admission: EM | Disposition: A | Discharge status: Skilled Nursing Facility | Payer: Self-pay | Source: Home / Self Care | Attending: Internal Medicine

## 2017-02-14 DIAGNOSIS — Z01810 Encounter for preprocedural cardiovascular examination: Secondary | ICD-10-CM

## 2017-02-14 DIAGNOSIS — I350 Nonrheumatic aortic (valve) stenosis: Secondary | ICD-10-CM

## 2017-02-14 DIAGNOSIS — I5032 Chronic diastolic (congestive) heart failure: Secondary | ICD-10-CM

## 2017-02-14 DIAGNOSIS — S72001A Fracture of unspecified part of neck of right femur, initial encounter for closed fracture: Secondary | ICD-10-CM

## 2017-02-14 HISTORY — PX: HIP ARTHROPLASTY: SHX981

## 2017-02-14 LAB — GLUCOSE, CAPILLARY
GLUCOSE-CAPILLARY: 92 mg/dL (ref 65–99)
GLUCOSE-CAPILLARY: 90 mg/dL (ref 65–99)
GLUCOSE-CAPILLARY: 86 mg/dL (ref 65–99)
Glucose-Capillary: 107 mg/dL — ABNORMAL HIGH (ref 65–99)
Glucose-Capillary: 89 mg/dL (ref 65–99)

## 2017-02-14 LAB — CBC
HEMATOCRIT: 35.2 % — AB (ref 36.0–46.0)
HEMOGLOBIN: 11.5 g/dL — AB (ref 12.0–15.0)
MCH: 32.4 pg (ref 26.0–34.0)
MCHC: 32.7 g/dL (ref 30.0–36.0)
MCV: 99.2 fL (ref 78.0–100.0)
Platelets: 115 10*3/uL — ABNORMAL LOW (ref 150–400)
RBC: 3.55 MIL/uL — ABNORMAL LOW (ref 3.87–5.11)
RDW: 15.8 % — AB (ref 11.5–15.5)
WBC: 5.1 10*3/uL (ref 4.0–10.5)

## 2017-02-14 LAB — ECHOCARDIOGRAM COMPLETE

## 2017-02-14 LAB — BASIC METABOLIC PANEL
ANION GAP: 9 (ref 5–15)
BUN: 31 mg/dL — AB (ref 6–20)
CO2: 22 mmol/L (ref 22–32)
Calcium: 8.6 mg/dL — ABNORMAL LOW (ref 8.9–10.3)
Chloride: 105 mmol/L (ref 101–111)
Creatinine, Ser: 2.09 mg/dL — ABNORMAL HIGH (ref 0.44–1.00)
GFR calc Af Amer: 22 mL/min — ABNORMAL LOW (ref >60)
GFR, EST NON AFRICAN AMERICAN: 19 mL/min — AB (ref >60)
Glucose, Bld: 126 mg/dL — ABNORMAL HIGH (ref 65–99)
POTASSIUM: 4.3 mmol/L (ref 3.5–5.1)
SODIUM: 136 mmol/L (ref 135–145)

## 2017-02-14 LAB — SURGICAL PCR SCREEN
MRSA, PCR: NEGATIVE
STAPHYLOCOCCUS AUREUS: NEGATIVE

## 2017-02-14 LAB — BRAIN NATRIURETIC PEPTIDE: B Natriuretic Peptide: 2528.5 pg/mL — ABNORMAL HIGH (ref 0.0–100.0)

## 2017-02-14 SURGERY — HEMIARTHROPLASTY, HIP, DIRECT ANTERIOR APPROACH, FOR FRACTURE
Anesthesia: Choice | Site: Hip | Laterality: Right

## 2017-02-14 MED ORDER — FENTANYL CITRATE (PF) 100 MCG/2ML IJ SOLN
INTRAMUSCULAR | Status: AC
  Administered 2017-02-14: 25 ug via INTRAVENOUS
  Filled 2017-02-14: qty 2

## 2017-02-14 MED ORDER — FENTANYL CITRATE (PF) 250 MCG/5ML IJ SOLN
INTRAMUSCULAR | Status: DC | PRN
  Administered 2017-02-14 (×4): 25 ug via INTRAVENOUS

## 2017-02-14 MED ORDER — METOCLOPRAMIDE HCL 5 MG/ML IJ SOLN: 5.0000 mg | Freq: Three times a day (TID) | INTRAMUSCULAR | Status: DC | PRN

## 2017-02-14 MED ORDER — LACTATED RINGERS IV SOLN
INTRAVENOUS | Status: DC
  Administered 2017-02-14: 20:00:00 via INTRAVENOUS

## 2017-02-14 MED ORDER — CHLORHEXIDINE GLUCONATE 4 % EX LIQD: 60.0000 mL | Freq: Once | CUTANEOUS | Status: DC

## 2017-02-14 MED ORDER — FENTANYL CITRATE (PF) 100 MCG/2ML IJ SOLN
25.0000 ug | INTRAMUSCULAR | Status: DC | PRN
  Administered 2017-02-14: 25 ug via INTRAVENOUS

## 2017-02-14 MED ORDER — ONDANSETRON HCL 4 MG/2ML IJ SOLN: 4.0000 mg | Freq: Once | INTRAMUSCULAR | Status: DC | PRN

## 2017-02-14 MED ORDER — CEFAZOLIN SODIUM-DEXTROSE 2-4 GM/100ML-% IV SOLN
2.0000 g | INTRAVENOUS | Status: AC
  Administered 2017-02-14: 2 g via INTRAVENOUS
  Filled 2017-02-14: qty 100

## 2017-02-14 MED ORDER — PROPOFOL 10 MG/ML IV BOLUS
INTRAVENOUS | Status: DC | PRN
  Administered 2017-02-14 (×4): 10 mg via INTRAVENOUS
  Administered 2017-02-14: 20 mg via INTRAVENOUS

## 2017-02-14 MED ORDER — LOSARTAN POTASSIUM-HCTZ 50-12.5 MG PO TABS: 1.0000 | ORAL_TABLET | Freq: Every day | ORAL | Status: DC

## 2017-02-14 MED ORDER — SODIUM CHLORIDE 0.9 % IR SOLN
Status: DC | PRN
Start: 2017-02-14 — End: 2017-02-14
  Administered 2017-02-14: 1000 mL

## 2017-02-14 MED ORDER — PROPOFOL 10 MG/ML IV BOLUS
INTRAVENOUS | Status: AC
  Filled 2017-02-14: qty 20

## 2017-02-14 MED ORDER — ONDANSETRON HCL 4 MG PO TABS: 4.0000 mg | ORAL_TABLET | Freq: Four times a day (QID) | ORAL | Status: DC | PRN

## 2017-02-14 MED ORDER — GLUCERNA SHAKE PO LIQD
237.0000 mL | Freq: Two times a day (BID) | ORAL | Status: DC
  Administered 2017-02-15 – 2017-02-19 (×8): 237 mL via ORAL

## 2017-02-14 MED ORDER — ACETAMINOPHEN 325 MG PO TABS: 650.0000 mg | ORAL_TABLET | Freq: Four times a day (QID) | ORAL | Status: DC | PRN

## 2017-02-14 MED ORDER — POVIDONE-IODINE 10 % EX SWAB: 2.0000 "application " | Freq: Once | CUTANEOUS | Status: DC

## 2017-02-14 MED ORDER — CEFAZOLIN SODIUM-DEXTROSE 2-4 GM/100ML-% IV SOLN
2.0000 g | Freq: Four times a day (QID) | INTRAVENOUS | Status: AC
  Administered 2017-02-15: 2 g via INTRAVENOUS
  Filled 2017-02-14 (×2): qty 100

## 2017-02-14 MED ORDER — HYDROCODONE-ACETAMINOPHEN 5-325 MG PO TABS
1.0000 | ORAL_TABLET | Freq: Four times a day (QID) | ORAL | Status: DC | PRN
  Administered 2017-02-14: 2 via ORAL
  Administered 2017-02-15: 1 via ORAL
  Administered 2017-02-15: 2 via ORAL
  Filled 2017-02-14: qty 2
  Filled 2017-02-14: qty 1
  Filled 2017-02-14: qty 2

## 2017-02-14 MED ORDER — ASPIRIN EC 325 MG PO TBEC
325.0000 mg | DELAYED_RELEASE_TABLET | Freq: Every day | ORAL | Status: DC
  Administered 2017-02-15 – 2017-02-19 (×5): 325 mg via ORAL
  Filled 2017-02-14 (×5): qty 1

## 2017-02-14 MED ORDER — LACTATED RINGERS IV SOLN
INTRAVENOUS | Status: DC | PRN
  Administered 2017-02-14: 16:00:00 via INTRAVENOUS

## 2017-02-14 MED ORDER — HYDRALAZINE HCL 20 MG/ML IJ SOLN: 5.0000 mg | Freq: Four times a day (QID) | INTRAMUSCULAR | Status: DC | PRN

## 2017-02-14 MED ORDER — HYDROCHLOROTHIAZIDE 12.5 MG PO CAPS: 12.5000 mg | ORAL_CAPSULE | Freq: Every day | ORAL | Status: DC

## 2017-02-14 MED ORDER — METOCLOPRAMIDE HCL 5 MG PO TABS: 5.0000 mg | ORAL_TABLET | Freq: Three times a day (TID) | ORAL | Status: DC | PRN

## 2017-02-14 MED ORDER — FENTANYL CITRATE (PF) 250 MCG/5ML IJ SOLN
INTRAMUSCULAR | Status: AC
  Filled 2017-02-14: qty 5

## 2017-02-14 MED ORDER — MORPHINE SULFATE (PF) 2 MG/ML IV SOLN: 0.5000 mg | INTRAVENOUS | Status: DC | PRN

## 2017-02-14 MED ORDER — ACETAMINOPHEN 650 MG RE SUPP: 650.0000 mg | Freq: Four times a day (QID) | RECTAL | Status: DC | PRN

## 2017-02-14 MED ORDER — MENTHOL 3 MG MT LOZG: 1.0000 | LOZENGE | OROMUCOSAL | Status: DC | PRN

## 2017-02-14 MED ORDER — PHENOL 1.4 % MT LIQD: 1.0000 | OROMUCOSAL | Status: DC | PRN

## 2017-02-14 MED ORDER — ONDANSETRON HCL 4 MG/2ML IJ SOLN: 4.0000 mg | Freq: Four times a day (QID) | INTRAMUSCULAR | Status: DC | PRN

## 2017-02-14 MED ORDER — ISOSORB DINITRATE-HYDRALAZINE 20-37.5 MG PO TABS
1.0000 | ORAL_TABLET | Freq: Three times a day (TID) | ORAL | Status: DC
  Administered 2017-02-14 – 2017-02-18 (×13): 1 via ORAL
  Filled 2017-02-14 (×21): qty 1

## 2017-02-14 MED ORDER — LOSARTAN POTASSIUM 50 MG PO TABS: 50.0000 mg | ORAL_TABLET | Freq: Every day | ORAL | Status: DC

## 2017-02-14 MED ORDER — CEFAZOLIN SODIUM-DEXTROSE 2-3 GM-% IV SOLR
INTRAVENOUS | Status: DC | PRN
  Administered 2017-02-14: 2 g via INTRAVENOUS

## 2017-02-14 SURGICAL SUPPLY — 55 items
BLADE CLIPPER SURG (BLADE) IMPLANT
BLADE SAW SAG 73X25 THK (BLADE) ×2
BLADE SAW SGTL 73X25 THK (BLADE) ×1 IMPLANT
CAPT HIP HEMI 1 ×2 IMPLANT
CHLORAPREP W/TINT 26ML (MISCELLANEOUS) ×3 IMPLANT
COVER SURGICAL LIGHT HANDLE (MISCELLANEOUS) ×3 IMPLANT
DRAPE HALF SHEET 40X57 (DRAPES) ×1 IMPLANT
DRAPE IMP U-DRAPE 54X76 (DRAPES) ×3 IMPLANT
DRAPE INCISE IOBAN 66X45 STRL (DRAPES) ×2 IMPLANT
DRAPE ORTHO SPLIT 77X108 STRL (DRAPES) ×6
DRAPE SURG ORHT 6 SPLT 77X108 (DRAPES) ×2 IMPLANT
DRAPE U-SHAPE 47X51 STRL (DRAPES) ×3 IMPLANT
DRILL BIT 7/64X5 (BIT) ×3 IMPLANT
DRSG AQUACEL AG ADV 3.5X10 (GAUZE/BANDAGES/DRESSINGS) ×2 IMPLANT
ELECT BLADE 6.5 EXT (BLADE) ×2 IMPLANT
ELECT REM PT RETURN 9FT ADLT (ELECTROSURGICAL) ×3
ELECTRODE REM PT RTRN 9FT ADLT (ELECTROSURGICAL) ×1 IMPLANT
EVACUATOR 1/8 PVC DRAIN (DRAIN) IMPLANT
GLOVE BIO SURGEON STRL SZ7 (GLOVE) ×3 IMPLANT
GLOVE BIO SURGEON STRL SZ7.5 (GLOVE) ×5 IMPLANT
GLOVE BIOGEL PI IND STRL 8 (GLOVE) ×1 IMPLANT
GLOVE BIOGEL PI INDICATOR 8 (GLOVE) ×2
GLOVE SS BIOGEL STRL SZ 8 (GLOVE) ×1 IMPLANT
GLOVE SUPERSENSE BIOGEL SZ 8 (GLOVE) ×2
GOWN STRL REUS W/ TWL LRG LVL3 (GOWN DISPOSABLE) ×3 IMPLANT
GOWN STRL REUS W/TWL LRG LVL3 (GOWN DISPOSABLE) ×9
HANDPIECE INTERPULSE COAX TIP (DISPOSABLE)
HOOD PEEL AWAY FACE SHEILD DIS (HOOD) ×6 IMPLANT
IMMOBILIZER KNEE 22 (SOFTGOODS) ×2 IMPLANT
IMMOBILIZER KNEE 22 UNIV (SOFTGOODS) IMPLANT
KIT BASIN OR (CUSTOM PROCEDURE TRAY) ×3 IMPLANT
KIT ROOM TURNOVER OR (KITS) ×3 IMPLANT
MANIFOLD NEPTUNE II (INSTRUMENTS) ×3 IMPLANT
NDL MAYO TROCAR (NEEDLE) ×1 IMPLANT
NEEDLE MAYO TROCAR (NEEDLE) ×3 IMPLANT
NS IRRIG 1000ML POUR BTL (IV SOLUTION) ×3 IMPLANT
PACK TOTAL JOINT (CUSTOM PROCEDURE TRAY) ×3 IMPLANT
PACK UNIVERSAL I (CUSTOM PROCEDURE TRAY) ×3 IMPLANT
PAD ARMBOARD 7.5X6 YLW CONV (MISCELLANEOUS) ×8 IMPLANT
PASSER SUT SWANSON 36MM LOOP (INSTRUMENTS) IMPLANT
PRESSURIZER FEMORAL UNIV (MISCELLANEOUS) IMPLANT
SET HNDPC FAN SPRY TIP SCT (DISPOSABLE) IMPLANT
STAPLER VISISTAT 35W (STAPLE) ×3 IMPLANT
SUT ETHIBOND NAB CT1 #1 30IN (SUTURE) ×12 IMPLANT
SUT VIC AB 0 CT1 27 (SUTURE) ×3
SUT VIC AB 0 CT1 27XBRD ANBCTR (SUTURE) ×1 IMPLANT
SUT VIC AB 1 CTB1 27 (SUTURE) ×8 IMPLANT
SUT VIC AB 2-0 CT1 27 (SUTURE) ×6
SUT VIC AB 2-0 CT1 TAPERPNT 27 (SUTURE) ×1 IMPLANT
TOWEL OR 17X24 6PK STRL BLUE (TOWEL DISPOSABLE) ×3 IMPLANT
TOWEL OR 17X26 10 PK STRL BLUE (TOWEL DISPOSABLE) ×3 IMPLANT
TOWER CARTRIDGE SMART MIX (DISPOSABLE) IMPLANT
TRAY FOLEY W/METER SILVER 16FR (SET/KITS/TRAYS/PACK) IMPLANT
WATER STERILE IRR 1000ML POUR (IV SOLUTION) ×12 IMPLANT
YANKAUER SUCT BULB TIP NO VENT (SUCTIONS) ×2 IMPLANT

## 2017-02-14 NOTE — Progress Notes (Signed)
  Echocardiogram 2D Echocardiogram has been performed.  Cynthia Todd 02/14/2017, 8:44 AM

## 2017-02-14 NOTE — Progress Notes (Signed)
   PATIENT ID: Cynthia Todd   Day of Surgery Procedure(s) (LRB): RIGHT HIP HEMIARTHROPLASTY (Right)  Subjective: No new c/o.    Objective:  Vitals:   02/14/17 1058 02/14/17 1400  BP: (!) 165/85 (!) 155/51  Pulse:    Resp:    Temp:       R hip TTP pain with ROM.   Labs:   Recent Labs  02/13/17 1423 02/14/17 0601  HGB 12.5 11.5*   Recent Labs  02/13/17 1423 02/14/17 0601  WBC 7.4 5.1  RBC 3.96 3.55*  HCT 38.6 35.2*  PLT 129* 115*   Recent Labs  02/13/17 1423 02/14/17 0601  NA 136 136  K 4.2 4.3  CL 104 105  CO2 23 22  BUN 32* 31*  CREATININE 2.13* 2.09*  GLUCOSE 178* 126*  CALCIUM 9.0 8.6*    Assessment and Plan:R displaced femoral neck fracture Plan R hip hemiarthroplasty Cardiology feels no further work up required, risk understood. Discussed with family who wish to proceed Risks / benefits of surgery discussed Consent on chart  NPO for OR Preop antibiotics

## 2017-02-14 NOTE — Progress Notes (Signed)
D; notified MD regarding High BP , no new order received.

## 2017-02-14 NOTE — Transfer of Care (Signed)
Immediate Anesthesia Transfer of Care Note  Patient: Cynthia Todd  Procedure(s) Performed: Procedure(s): RIGHT HIP HEMIARTHROPLASTY (Right)  Patient Location: PACU  Anesthesia Type:Spinal  Level of Consciousness: awake  Airway & Oxygen Therapy: Patient Spontanous Breathing and Patient connected to nasal cannula oxygen  Post-op Assessment: Report given to RN and Post -op Vital signs reviewed and stable  Post vital signs: Reviewed and stable  Last Vitals:  Vitals:   02/14/17 1058 02/14/17 1400  BP: (!) 165/85 (!) 155/51  Pulse:    Resp:    Temp:      Last Pain:  Vitals:   02/14/17 0500  TempSrc:   PainSc: Asleep         Complications: No apparent anesthesia complications

## 2017-02-14 NOTE — Anesthesia Preprocedure Evaluation (Addendum)
Anesthesia Evaluation  Patient identified by MRN, date of birth, ID band Patient awake    Reviewed: Allergy & Precautions, NPO status , Patient's Chart, lab work & pertinent test results  Airway Mallampati: II  TM Distance: >3 FB Neck ROM: Full    Dental  (+) Dental Advisory Given, Poor Dentition   Pulmonary shortness of breath and with exertion,    breath sounds clear to auscultation       Cardiovascular hypertension, +CHF   Rhythm:Regular Rate:Normal     Neuro/Psych    GI/Hepatic GERD  ,  Endo/Other  diabetes  Renal/GU Renal InsufficiencyRenal disease     Musculoskeletal   Abdominal   Peds  Hematology   Anesthesia Other Findings   Reproductive/Obstetrics                            Anesthesia Physical Anesthesia Plan  ASA: III  Anesthesia Plan: Spinal   Post-op Pain Management:    Induction: Intravenous  PONV Risk Score and Plan: Ondansetron  Airway Management Planned: Natural Airway and Simple Face Mask  Additional Equipment:   Intra-op Plan:   Post-operative Plan:   Informed Consent: I have reviewed the patients History and Physical, chart, labs and discussed the procedure including the risks, benefits and alternatives for the proposed anesthesia with the patient or authorized representative who has indicated his/her understanding and acceptance.     Plan Discussed with: CRNA and Anesthesiologist  Anesthesia Plan Comments:         Anesthesia Quick Evaluation

## 2017-02-14 NOTE — Progress Notes (Signed)
Patient ID: Cynthia Todd, female   DOB: Mar 04, 1919, 83 y.o.   MRN: [id number]                                                                PROGRESS NOTE                                                                                                                                                                                                             Patient Demographics:    Cynthia Todd, is a 83 y.o. female, DOB - [birth year], GEX:[id number]  Admit date - 02/13/2017   Admitting Physician Jones Broom, MD  Outpatient Primary MD for the patient is Fleet Contras, MD  LOS - 1  Outpatient Specialists:  Chief Complaint  Patient presents with  . Fall  . Knee Pain       Brief Narrative    83 y.o. female with a past medical history of chronic diastolic CHF, diabetes, who had a mechanical fall earlier today resulting in pain in the right hip as well as knee. Patient does not speak any English, so not much history was available. She was noted to be grimacing and so was thought to be in pain. No chest pain or shortness of breath. History is very limited.    Subjective:    Cynthia Todd today has right hip pain,  awaitinig cardiac echo results  No headache, No chest pain, No abdominal pain - No Nausea, No new weakness tingling or numbness, No Cough - SOB.    Assessment  & Plan :    Active Problems:   Diabetes mellitus (HCC)   Diastolic heart failure, NYHA class 1 (HCC)   Pulmonary HTN (HCC)   CKD (chronic kidney disease) stage 3, GFR 30-59 ml/min   Closed right hip fracture (HCC)   Cardiomegaly   #1. Right hip fracture:  Orthopedics has been consulted. Dr. Ave Filter wanted her to go to surgery 6/15  . However, chest x-ray shows new cardiomegaly compared to 2018. This was discussed with Dr. Rennis Golden with cardiology who would prefer to have an echocardiogram done before clearing her for surgery.  Pain control.  Cardiology consult for clearance  #2 Cardiomegaly, CHF (diastolic), MR  (moderate on echo 06/20/2016)  #3 Chronic kidney disease stage 3 (baseline 1.3) Holding ARB  and diuretic for now NS iv at 50ml per hour check cmp in am  #4 Diabetes mellitus type II: She is on Lantus 1/2 normal dose  Sliding scale insulin coverage.  #5 Hypertension uncontrolled,  Start hydralazine 25mg  po tid  #6 Abnormal liver function Check RUQ ultrasound Acute hepatitis panel  #7 Thrombocytopenia Check cbc in am  DVT Prophylaxis: SCDs for now Code Status: Full code Family Communication: granddaughter Consults called: Orthopedics  Admission status: Inpatient     Lab Results  Component Value Date   PLT 129 (L) 02/13/2017    Antibiotics  :  none  Anti-infectives    None        Objective:   Vitals:   02/13/17 2105 02/13/17 2208 02/14/17 0237 02/14/17 0454  BP: (!) 212/92 (!) 166/80 (!) 176/59 (!) 164/64  Pulse: 79 80 83 84  Resp: (!) 22     Temp:  97.6 F (36.4 C) 98.1 F (36.7 C) 98.8 F (37.1 C)  TempSrc:  Axillary Axillary Axillary  SpO2: 100% 100% 100% 95%    Wt Readings from Last 3 Encounters:  06/20/16 54.1 kg (119 lb 4.3 oz)  11/19/14 60.6 kg (133 lb 9.6 oz)  06/15/13 65.8 kg (145 lb 1 oz)     Intake/Output Summary (Last 24 hours) at 02/14/17 0612 Last data filed at 02/14/17 0300  Gross per 24 hour  Intake           443.33 ml  Output                0 ml  Net           443.33 ml     Physical Exam  Awake Alert, Oriented X 3, No new F.N deficits, Normal affect [place].AT,PERRAL Supple Neck,No JVD, No cervical lymphadenopathy appriciated.  Symmetrical Chest wall movement, Good air movement bilaterally, CTAB RRR,S1, S2  2/6 sem rusb/apex +ve B.Sounds, Abd Soft, No tenderness, No organomegaly appriciated, No rebound - guarding or rigidity. No Cyanosis, Clubbing or edema, No new Rash or bruise      Data Review:    CBC  Recent Labs Lab 02/13/17 1423  WBC 7.4  HGB 12.5  HCT 38.6  PLT 129*  MCV 97.5  MCH 31.6  MCHC 32.4  RDW  15.8*  LYMPHSABS 0.4*  MONOABS 0.3  EOSABS 0.0  BASOSABS 0.0    Chemistries   Recent Labs Lab 02/13/17 1423  NA 136  K 4.2  CL 104  CO2 23  GLUCOSE 178*  BUN 32*  CREATININE 2.13*  CALCIUM 9.0  AST 44*  ALT 28  ALKPHOS 156*  BILITOT 1.8*   ------------------------------------------------------------------------------------------------------------------ No results for input(s): CHOL, HDL, LDLCALC, TRIG, CHOLHDL, LDLDIRECT in the last 72 hours.  Lab Results  Component Value Date   HGBA1C 5.7 (H) 06/19/2016   ------------------------------------------------------------------------------------------------------------------ No results for input(s): TSH, T4TOTAL, T3FREE, THYROIDAB in the last 72 hours.  Invalid input(s): FREET3 ------------------------------------------------------------------------------------------------------------------ No results for input(s): VITAMINB12, FOLATE, FERRITIN, TIBC, IRON, RETICCTPCT in the last 72 hours.  Coagulation profile No results for input(s): INR, PROTIME in the last 168 hours.  No results for input(s): DDIMER in the last 72 hours.  Cardiac Enzymes No results for input(s): CKMB, TROPONINI, MYOGLOBIN in the last 168 hours.  Invalid input(s): CK ------------------------------------------------------------------------------------------------------------------    Component Value Date/Time   BNP 974.4 (H) 06/19/2016 1901    Inpatient Medications  Scheduled Meds: . aspirin  81 mg Oral Daily  . insulin aspart  0-15 Units Subcutaneous  TID WC  . insulin glargine  5 Units Subcutaneous Daily  . isosorbide-hydrALAZINE  1 tablet Oral BID  . levothyroxine  50 mcg Oral QAC breakfast  . metoprolol succinate  50 mg Oral Daily  . multivitamin with minerals  1 tablet Oral Daily  . pantoprazole  40 mg Oral Daily  . senna  1 tablet Oral BID   Continuous Infusions: . sodium chloride 50 mL/hr at 02/13/17 2256   PRN Meds:.albuterol,  HYDROcodone-acetaminophen, morphine injection  Micro Results Recent Results (from the past 240 hour(s))  Surgical pcr screen     Status: None   Collection Time: 02/14/17  2:09 AM  Result Value Ref Range Status   MRSA, PCR NEGATIVE NEGATIVE Final   Staphylococcus aureus NEGATIVE NEGATIVE Final    Comment:        The Xpert SA Assay (FDA approved for NASAL specimens in patients over 81 years of age), is one component of a comprehensive surveillance program.  Test performance has been validated by [facility] for patients greater than or equal to 81 year old. It is not intended to diagnose infection nor to guide or monitor treatment.     Radiology Reports Dg Chest 1 View  Result Date: 02/13/2017 CLINICAL DATA:  Fall. EXAM: CHEST 1 VIEW COMPARISON:  06/19/2016 FINDINGS: New cardiopericardial enlargement with possible pericardial effusion morphology. New pleural effusion on the right that is small volume. Vascular congestion. No air bronchogram or pneumothorax. Osteopenia and diffuse spondylosis. No acute osseous finding. IMPRESSION: 1. Cardiopericardial enlargement that is new from 2017. There could be a pericardial effusion based on the shape. 2. Small volume right pleural fluid. 3. Pulmonary vascular congestion. Electronically Signed   By: Marnee SpringJonathon  Watts M.D.   On: 02/13/2017 15:04   Dg Knee Complete 4 Views Right  Result Date: 02/13/2017 CLINICAL DATA:  Right knee pain after fall today. EXAM: RIGHT KNEE - COMPLETE 4+ VIEW COMPARISON:  None. FINDINGS: No evidence of fracture, dislocation, or joint effusion. Severe narrowing of the medial and lateral joint spaces is noted. Spurring of the patella is noted. Vascular calcifications are noted. IMPRESSION: Severe degenerative joint disease. No acute abnormality seen in the right knee. Electronically Signed   By: Lupita RaiderJames  Green Jr, M.D.   On: 02/13/2017 15:05   Dg Hip Unilat W Or Wo Pelvis 2-3 Views Right  Result Date: 02/13/2017 CLINICAL  DATA:  Fall.  Right hip and knee pain.  Initial encounter. EXAM: DG HIP (WITH OR WITHOUT PELVIS) 2-3V RIGHT COMPARISON:  None. FINDINGS: The right femur appears foreshortened and is in external rotation with appearance compatible with a femoral neck fracture. Detailed assessment is limited by external rotation. The right femoral head remains seated in the acetabulum. Vascular calcifications are noted. IMPRESSION: Right femoral neck fracture. Electronically Signed   By: Sebastian AcheAllen  Grady M.D.   On: 02/13/2017 15:10    Time Spent in minutes  30   Pearson GrippeJames Hamlet Lasecki M.D on 02/14/2017 at 6:12 AM  Between 7am to 7pm - Pager - [phone]  After 7pm go to [email or web address] - password [email or web address]  [facility] -  Office  [phone]

## 2017-02-14 NOTE — Op Note (Signed)
Procedure(s): RIGHT HIP HEMIARTHROPLASTY Procedure Note  Cynthia Todd female 83 y.o. 02/14/2017  Procedure(s) and Anesthesia Type:    * RIGHT HIP HEMIARTHROPLASTY - Choice  Surgeon(s) and Role:    Jones Broom* Adaiah Jaskot, MD - Primary   Indications:  83 y.o. female s/p fall with right hip fracture. Indicated for surgery to promote early ambulation, pain control and prevent complications of bed rest.     Surgeon: Mable ParisHANDLER,Rayman Petrosian WILLIAM   Assistants: Damita Lackanielle Lalibert PA-C (Danielle was present and scrubbed throughout the procedure and was essential in positioning, retraction, exposure, and closure)  Anesthesia: Spinal anesthesia   Procedure Detail  RIGHT HIP HEMIARTHROPLASTY  Findings: DePuy size 4 press-fit [place] hemiarthroplasty, 44 head  Estimated Blood Loss:  200 mL         Drains: none  Blood Given: none          Specimens: none        Complications:  * No complications entered in OR log *         Disposition: PACU - hemodynamically stable.         Condition: stable    Procedure:  The patient was identified in the preoperative  holding area where I personally marked the operative site after  verifying site side and procedure with the patient. She was taken back  to the operating room where general anesthesia was induced without  Complication. The patient was placed in lateral decubitus position with the  right side up. The right lower extremity was then prepped and draped in standard sterile fashion. The patient did receive IV antibiotics prior to the  incision.   After the appropriate time-out, an approximately 12-cm  incision was made over the posterior third of the greater trochanter.  Dissection was carried down to the fascia which was split longitudinally  in line with the incision. The piriformis was tagged and the external  rotators were then taken down off the posterior greater trochanter in 1  sheath with the posterior capsule. The fracture was  exposed. Posterior  capsule and external rotators were split just below the piriformis down  to the level of the acetabulum. Great care was taken to protect the  sciatic nerve. The proximal femoral cut was then made using the cut  guide and the femoral head was then removed and sized and felt to be 44.  The proximal femur was then prepared by first using an intramedullary  canal finder, a lateralizer and then sequentially broaching from 1 to 4.   The size 44 head with 0 offset neck was then placed and a trial reduction was performed. It was felt  to be excellent in terms of the soft tissue tension. I was able to  bring up to 90 degrees of flexion and 70 degrees internal rotation  without any instability. Leg lengths were felt to be appropriate. The  hip was dislocated. The broach was then removed. The cement restrictor  was then placed and the canal was prepared with pulse lavage with a  brush. The size 4 stem was then press fit into place in approximately 15-20  degrees anteversion.The size 44 0 offset head was then placed and impacted. It  was then reduced after ensuring that there was nothing in the  acetabulum. The hip reduced nicely and again it was taken through the  trial. I was able to flex to 90 degrees, internal rotation to 60  without any instability. Soft tissue tension was excellent and lengths  were  felt to be equal. The joint was then copiously irrigated with  normal saline with pulse lavage and then the external rotators were  repaired using Ethibond sutures through the greater trochanter and tied  over a bone bridge. The deep fascia was then closed using #1 Vicryl in  running fashion proximally and distally. The skin was then closed using  2-0 Vicryl in deep dermal layer and staples for skin closure. Sterile  dressings were then applied including aquacel dressing. The patient was  then placed in a knee immobilizer, rolled into supine position and  extubated. She was then  transferred to the PACU in stable condition.    POSTOPERATIVE PLAN: The patient will be weightbearing as tolerated on the operative Extremity with posterior hip precautions and will have DVT prophylaxis of ECASA and SCDs.

## 2017-02-14 NOTE — Consult Note (Signed)
Cardiology Consultation:   Patient ID: Cynthia Todd; [id number]; 04/18/1919   Admit date: 02/13/2017 Date of Consult: 02/14/2017  Primary Care Provider: Fleet ContrasAvbuere, Edwin, MD Primary Cardiologist: New Primary Electrophysiologist:  New   Patient Profile:   Cynthia Todd is a 83 y.o. female with a hx of DM, HTN, CVA, CKD III, thyroid nodule who is being seen today for preop evaluation prior to repair of a hip fx at the request of Dr Selena BattenKim. She is from [place], grandchildren help with translation.  History of Present Illness:   Cynthia Todd has no history of CAD or any cardiac evaluation.   She denies any history of chest pain. She has no recent systemic illness. She recently had an infection in her L leg. There was swelling associated with that, the infection was treated and has resolved. She still has some foot swelling, otherwise no edema.  She has been having problems with increased DOE and describes PND, but denies orthopnea. She has no been SOB at rest. She does very little around the house. She lives with her daughter. These symptoms have been going on for a month or 2, no precipitating event known.   She was admitted in 2014 with D-CHF, EF 65-70% with grade 2 diastolic dysfunction on echo. 2017 echo done for syncope showed EF 55-60% w/ grade 3 dd. Ao valve sclerosis.  In 2016, she weighed 133 lbs, 06/2016 119 lbs, believe she is less than that now.    Past Medical History:  Diagnosis Date  . Chronic diastolic CHF (congestive heart failure), NYHA class 2 (HCC) 2013  . CKD (chronic kidney disease) stage 3, GFR 30-59 ml/min 06/19/2016  . Dementia   . Diabetes mellitus   . GERD (gastroesophageal reflux disease) 06/19/2016  . Hypertension   . Stroke (HCC) 06/20/2016   L cerebellar on MRI 06/20/2016  . Thyroid nodule 09/28/2011    Past Surgical History:  Procedure Laterality Date  . None       Inpatient Medications: Scheduled Meds: . aspirin  81 mg Oral Daily  . insulin aspart   0-15 Units Subcutaneous TID WC  . insulin glargine  5 Units Subcutaneous Daily  . isosorbide-hydrALAZINE  1 tablet Oral BID  . levothyroxine  50 mcg Oral QAC breakfast  . metoprolol succinate  50 mg Oral Daily  . multivitamin with minerals  1 tablet Oral Daily  . pantoprazole  40 mg Oral Daily  . senna  1 tablet Oral BID   Continuous Infusions:  PRN Meds: albuterol, HYDROcodone-acetaminophen, morphine injection  Allergies:   No Known Allergies  Social History:   Social History   Social History  . Marital status: Widowed    Spouse name: N/A  . Number of children: N/A  . Years of education: N/A   Occupational History  . Retired    Social History Main Topics  . Smoking status: Never Smoker  . Smokeless tobacco: Never Used  . Alcohol use No  . Drug use: No  . Sexual activity: No   Other Topics Concern  . Not on file   Social History Narrative   Pt lives with her daughter and a granddaughter.    The 2 granddaughters and the grandson help with translation.   The family is from [place].    Family History:   The patient's family history is negative for CAD.  ROS:  Please see the history of present illness.  Review of Systems  Constitution: Positive for weight loss.  HENT: Negative.  Eyes: Negative.   Cardiovascular: Positive for dyspnea on exertion and leg swelling.  Respiratory: Positive for cough and shortness of breath.   Endocrine: Negative.   Hematologic/Lymphatic: Negative.   Skin: Negative.   Musculoskeletal: Positive for falls.  Gastrointestinal: Negative.   Genitourinary: Negative.   Neurological: Negative.   Psychiatric/Behavioral: Negative.   Allergic/Immunologic: Negative.     All other ROS reviewed and negative.     Physical Exam/Data:   Vitals:   02/14/17 0237 02/14/17 0454 02/14/17 0959 02/14/17 1058  BP: (!) 176/59 (!) 164/64 (!) 168/86 (!) 165/85  Pulse: 83 84    Resp:      Temp: 98.1 F (36.7 C) 98.8 F (37.1 C)    TempSrc:  Axillary Axillary    SpO2: 100% 95%      Intake/Output Summary (Last 24 hours) at 02/14/17 1106 Last data filed at 02/14/17 0300  Gross per 24 hour  Intake           443.33 ml  Output                0 ml  Net           443.33 ml   General:  Well nourished, well developed, in mild acute distress HEENT: normal Lymph: no adenopathy Neck:  JVD 13 cm Endocrine:  No thryomegaly Vascular: No carotid bruits; FA pulses 2+ bilaterally without bruits  Cardiac:  normal S1, S2; RRR; soft murmur  Lungs:  Decreased BS bases bilaterally with some rales, no wheezing, rhonchi or rales  Abd: soft, nontender, no hepatomegaly  Ext: trace L pedal edema Musculoskeletal:  + deformity RLE, BUE and LLE strength weak and equal Skin: warm and dry  Neuro:  CNs 2-12 intact, no focal abnormalities noted; pt able to answer simple questions   EKG:  The EKG was personally reviewed and demonstrates SR, LVH, PVCs  Relevant CV Studies:  ECHO: performed, not read  Laboratory Data:  Chemistry  Recent Labs Lab 02/13/17 1423 02/14/17 0601  NA 136 136  K 4.2 4.3  CL 104 105  CO2 23 22  GLUCOSE 178* 126*  BUN 32* 31*  CREATININE 2.13* 2.09*  CALCIUM 9.0 8.6*  GFRNONAA 18* 19*  GFRAA 21* 22*  ANIONGAP 9 9     Recent Labs Lab 02/13/17 1423  PROT 8.3*  ALBUMIN 3.4*  AST 44*  ALT 28  ALKPHOS 156*  BILITOT 1.8*   Hematology  Recent Labs Lab 02/13/17 1423 02/14/17 0601  WBC 7.4 5.1  RBC 3.96 3.55*  HGB 12.5 11.5*  HCT 38.6 35.2*  MCV 97.5 99.2  MCH 31.6 32.4  MCHC 32.4 32.7  RDW 15.8* 15.8*  PLT 129* 115*    Radiology/Studies:  Dg Chest 1 View Result Date: 02/13/2017 CLINICAL DATA:  Fall. EXAM: CHEST 1 VIEW COMPARISON:  06/19/2016 FINDINGS: New cardiopericardial enlargement with possible pericardial effusion morphology. New pleural effusion on the right that is small volume. Vascular congestion. No air bronchogram or pneumothorax. Osteopenia and diffuse spondylosis. No acute osseous  finding. IMPRESSION: 1. Cardiopericardial enlargement that is new from 2017. There could be a pericardial effusion based on the shape. 2. Small volume right pleural fluid. 3. Pulmonary vascular congestion. Electronically Signed   By: Marnee Spring M.D.   On: 02/13/2017 15:04   Dg Knee Complete 4 Views Right Result Date: 02/13/2017 CLINICAL DATA:  Right knee pain after fall today. EXAM: RIGHT KNEE - COMPLETE 4+ VIEW COMPARISON:  None. FINDINGS: No evidence of fracture, dislocation, or  joint effusion. Severe narrowing of the medial and lateral joint spaces is noted. Spurring of the patella is noted. Vascular calcifications are noted. IMPRESSION: Severe degenerative joint disease. No acute abnormality seen in the right knee. Electronically Signed   By: Lupita Raider, M.D.   On: 02/13/2017 15:05   Dg Hip Unilat W Or Wo Pelvis 2-3 Views Right Result Date: 02/13/2017 CLINICAL DATA:  Fall.  Right hip and knee pain.  Initial encounter. EXAM: DG HIP (WITH OR WITHOUT PELVIS) 2-3V RIGHT COMPARISON:  None. FINDINGS: The right femur appears foreshortened and is in external rotation with appearance compatible with a femoral neck fracture. Detailed assessment is limited by external rotation. The right femoral head remains seated in the acetabulum. Vascular calcifications are noted. IMPRESSION: Right femoral neck fracture. Electronically Signed   By: Sebastian Ache M.D.   On: 02/13/2017 15:10    Assessment and Plan:   1. Preop evaluation: - she is very frail at baseline - she has evidence of worsening CHF - there is concern for increased heart size and possible pericardial effusion on CXR - will discuss diuresis with MD  - MD to review echo and then decide on management - Need to review all data before determining surgical risk.    Tawny Asal  02/14/2017 11:06 AM   Attending note Patient seen and discussed with PA Barrett, I agree with her documentation. 83 yo female history of chronic  diastolic HF, DM2 admitted with fall, found to have right hip fracture. Cardiology is consulted for preoperative risk stratification. As part of workup CXR shows evidence of effusion, cardiomegaly, pulmony congestion. Echo is pending.    WBC 7.4 Hgb 12.5 Plt 129 K 4.2 Cr 2.13  CXR: cardiopericardial enlargement, pulm vascular congestion EKG SR with occasional PVCs Echo 06/2016: LVEF 55-60%, no WMAs, restrictive diastolic function, mod MR, RV dysfunction, PASP 68  Echo reviewed, showed LVEF 55-60%, restrictive diastolic dysfunction. Mild pericardial effusion without tamponade, stable pulmonary HTN. No acute changes by echo. Patient 83 yo with limited functional capacity. There is no acute cardiac conditions that prohibits her from surgery, nor is there a chronic cardiac condition that is prohbitive. Her risk mainly lies with what would be expected with her advanced age and poor chronic functional capacity. Would not pursue ischemic testing as this would only delay her neccesary surgery. From cardiac standpoint recommend proceeding with surgery as planned if family and patient accepting of baseline risk.   Dina Rich MD

## 2017-02-14 NOTE — Anesthesia Postprocedure Evaluation (Signed)
Anesthesia Post Note  Patient: Cynthia Todd  Procedure(s) Performed: Procedure(s) (LRB): RIGHT HIP HEMIARTHROPLASTY (Right)     Patient location during evaluation: PACU Anesthesia Type: Spinal Level of consciousness: awake, awake and alert and oriented Pain management: pain level controlled Vital Signs Assessment: post-procedure vital signs reviewed and stable Respiratory status: spontaneous breathing, nonlabored ventilation and respiratory function stable Postop Assessment: spinal receding Anesthetic complications: no    Last Vitals:  Vitals:   02/14/17 1900 02/14/17 1915  BP: (!) 164/82 (!) 182/74  Pulse: 68 (!) 50  Resp: 15 20  Temp:      Last Pain:  Vitals:   02/14/17 0500  TempSrc:   PainSc: Asleep                 Alzada Brazee COKER

## 2017-02-14 NOTE — Progress Notes (Signed)
Initial Nutrition Assessment  DOCUMENTATION CODES:   Not applicable  INTERVENTION:  Once diet advances, provide Glucerna Shake po BID, each supplement provides 220 kcal and 10 grams of protein.  Recommend obtaining new weight to fully assess weight trends.   NUTRITION DIAGNOSIS:   Increased nutrient needs related to  (surgery) as evidenced by estimated needs.  GOAL:   Patient will meet greater than or equal to 90% of their needs  MONITOR:   Diet advancement, Supplement acceptance, Weight trends, Labs, I & O's, Skin  REASON FOR ASSESSMENT:   Consult Hip fracture protocol  ASSESSMENT:   83 y.o. female with a past medical history of chronic diastolic CHF, diabetes, who had a mechanical fall earlier today resulting in pain in the right hip as well as knee. Patient does not speak any [place]. Pt with right hip fracture.  Pt is currently NPO for surgery today. Pt was unavailable during time of visit. Pt was preparing for surgery with RN. RD unable to obtain nutrition history. Noted no new weight recorded. Recommend obtaining new weight to fully assess weight trends. RD to order Glucerna Shake to aid in post op healing once diet advances.  Unable to complete Nutrition-Focused physical exam at this time.   Labs and medications reviewed.   Diet Order:  Diet NPO time specified  Skin:  Reviewed, no issues  Last BM:  6/15  Height:   Ht Readings from Last 1 Encounters:  06/20/16 5\' 4"  (1.626 m)    Weight:   Wt Readings from Last 1 Encounters:  06/20/16 119 lb 4.3 oz (54.1 kg)    Ideal Body Weight:  54.5 kg  BMI:  There is no height or weight on file to calculate BMI.  Estimated Nutritional Needs:   Kcal:  1300-1500  Protein:  55-65 grams  Fluid:  >/= 1.5 L/day  EDUCATION NEEDS:   No education needs identified at this time  Roslyn SmilingStephanie Ovetta Bazzano, MS, RD, LDN Pager # [phone] After hours/ weekend pager # [phone]

## 2017-02-14 NOTE — Anesthesia Procedure Notes (Signed)
Spinal  Start time: 02/14/2017 4:00 PM End time: 02/14/2017 4:05 PM Staffing Anesthesiologist: Noreene LarssonJOSLIN, Mathilde Mcwherter Performed: anesthesiologist  Preanesthetic Checklist Completed: patient identified, site marked, surgical consent, pre-op evaluation, timeout performed, IV checked, risks and benefits discussed and monitors and equipment checked Spinal Block Patient position: right lateral decubitus Prep: DuraPrep Patient monitoring: heart rate, cardiac monitor and blood pressure Approach: midline Location: L3-4 Injection technique: single-shot Needle Needle type: Tuohy  Needle gauge: 22 G Assessment Sensory level: T6 Additional Notes 14 mg 0.75% bupivacaine injected easily

## 2017-02-15 DIAGNOSIS — K7689 Other specified diseases of liver: Secondary | ICD-10-CM

## 2017-02-15 DIAGNOSIS — R945 Abnormal results of liver function studies: Secondary | ICD-10-CM

## 2017-02-15 DIAGNOSIS — Z96649 Presence of unspecified artificial hip joint: Secondary | ICD-10-CM

## 2017-02-15 LAB — COMPREHENSIVE METABOLIC PANEL
ALT: 18 U/L (ref 14–54)
ANION GAP: 8 (ref 5–15)
AST: 40 U/L (ref 15–41)
Albumin: 2.3 g/dL — ABNORMAL LOW (ref 3.5–5.0)
Alkaline Phosphatase: 109 U/L (ref 38–126)
BUN: 37 mg/dL — ABNORMAL HIGH (ref 6–20)
CALCIUM: 8.4 mg/dL — AB (ref 8.9–10.3)
CHLORIDE: 105 mmol/L (ref 101–111)
CO2: 23 mmol/L (ref 22–32)
CREATININE: 2.49 mg/dL — AB (ref 0.44–1.00)
GFR, EST AFRICAN AMERICAN: 18 mL/min — AB (ref >60)
GFR, EST NON AFRICAN AMERICAN: 15 mL/min — AB (ref >60)
Glucose, Bld: 133 mg/dL — ABNORMAL HIGH (ref 65–99)
Potassium: 4.7 mmol/L (ref 3.5–5.1)
Sodium: 136 mmol/L (ref 135–145)
Total Bilirubin: 1.3 mg/dL — ABNORMAL HIGH (ref 0.3–1.2)
Total Protein: 6.7 g/dL (ref 6.5–8.1)

## 2017-02-15 LAB — GLUCOSE, CAPILLARY
GLUCOSE-CAPILLARY: 155 mg/dL — AB (ref 65–99)
Glucose-Capillary: 118 mg/dL — ABNORMAL HIGH (ref 65–99)
Glucose-Capillary: 190 mg/dL — ABNORMAL HIGH (ref 65–99)
Glucose-Capillary: 165 mg/dL — ABNORMAL HIGH (ref 65–99)

## 2017-02-15 LAB — CBC
HCT: 31 % — ABNORMAL LOW (ref 36.0–46.0)
Hemoglobin: 9.7 g/dL — ABNORMAL LOW (ref 12.0–15.0)
MCH: 30.9 pg (ref 26.0–34.0)
MCHC: 31.3 g/dL (ref 30.0–36.0)
MCV: 98.7 fL (ref 78.0–100.0)
PLATELETS: 149 10*3/uL — AB (ref 150–400)
RBC: 3.14 MIL/uL — AB (ref 3.87–5.11)
RDW: 15.8 % — ABNORMAL HIGH (ref 11.5–15.5)
WBC: 7.6 10*3/uL (ref 4.0–10.5)

## 2017-02-15 NOTE — Progress Notes (Signed)
Patient ID: Cynthia Todd, female   DOB: 10/14/1918, 83 y.o.   MRN: [id number]                                                                PROGRESS NOTE                                                                                                                                                                                                             Patient Demographics:    Cynthia Todd, is a 83 y.o. female, DOB - 08/05/1919, [id number]  Admit date - 02/13/2017   Admitting Physician Jones BroomJustin Chandler, MD  Outpatient Primary MD for the patient is Fleet ContrasAvbuere, Edwin, MD  LOS - 2  Outpatient Specialists:     Chief Complaint  Patient presents with  . Fall  . Knee Pain       Brief Narrative     83 y.o.femalewith a past medical history of chronic diastolic CHF, diabetes, who had a mechanical fall earlier today resulting in pain in the right hip as well as knee. Patient does not speak any English, so not much history was available. She was noted to be grimacing and so was thought to be in pain. No chest pain or shortness of breath. History is very limited.   Subjective:    Cynthia LarryMaryan Kock today is doing well POD #1 for R THA.  No headache, No chest pain, No abdominal pain - No Nausea, No new weakness tingling or numbness, No Cough - SOB.    Assessment  & Plan :    Active Problems:   Diabetes mellitus (HCC)   Diastolic heart failure, NYHA class 1 (HCC)   Pulmonary HTN (HCC)   CKD (chronic kidney disease) stage 3, GFR 30-59 ml/min   Closed right hip fracture (HCC)   Cardiomegaly   Closed fracture of neck of right femur (HCC)   #1. Right hip fracture:  S/p R THA POD #1 Orthopedics has been consulted. Dr. Ave Filterhandler wanted her to go to surgery 6/15  . However, chest x-ray shows newcardiomegaly compared to 2018. This was discussed with Dr. Rennis GoldenHilty with cardiology who would prefer to have an echocardiogram done before clearing her for surgery.  Appreciateorthopedic input  #2 Cardiomegaly,  CHF (diastolic), MR, Mild-Moderate AR(cardiac echo 02/14/2017) Stable  #  3 Chronic kidney disease stage 3 (baseline 1.3), Mild Acute on CRF Hold diuretic for now, Hold Losartan for now check cmp in am  #4 Diabetes mellitus type II: She is on Lantus 1/2 normal dose  Sliding scale insulin coverage.  #5 Hypertension uncontrolled,  Cont Bidil tid Hydralazine iv prn   #6 Abnormal liver function RUQ ultrasound 6/16=> numerous hepatic granuloma  #7 Thrombocytopenia Check cbc in am  #8 Anemia Likely post op Repeat cbc in am  DVT Prophylaxis:SCDs for now Code Status:Full code Family Communication:granddaughter Consults called:Orthopedics Admission status:Inpatient     Lab Results  Component Value Date   PLT 149 (L) 02/15/2017    Antibiotics  :  Ancef 6/16  Anti-infectives    Start     Dose/Rate Route Frequency Ordered Stop   02/14/17 1830  ceFAZolin (ANCEF) IVPB 2g/100 mL premix     2 g 200 mL/hr over 30 Minutes Intravenous Every 6 hours 02/14/17 1822 02/15/17 0629   02/14/17 1500  ceFAZolin (ANCEF) IVPB 2g/100 mL premix     2 g 200 mL/hr over 30 Minutes Intravenous On call to O.R. 02/14/17 1437 02/14/17 1606        Objective:   Vitals:   02/14/17 2008 02/14/17 2101 02/14/17 2328 02/15/17 0441  BP: (!) 196/68  (!) 190/78 (!) 139/41  Pulse: 70  74 71  Resp:      Temp: 98.5 F (36.9 C)  98.6 F (37 C) 97.9 F (36.6 C)  TempSrc: Axillary  Axillary Axillary  SpO2: 99% 99% 100% 94%    Wt Readings from Last 3 Encounters:  06/20/16 54.1 kg (119 lb 4.3 oz)  11/19/14 60.6 kg (133 lb 9.6 oz)  06/15/13 65.8 kg (145 lb 1 oz)     Intake/Output Summary (Last 24 hours) at 02/15/17 0745 Last data filed at 02/15/17 0645  Gross per 24 hour  Intake              600 ml  Output             1000 ml  Net             -400 ml     Physical Exam  Awake Alert, Oriented X 3, No new F.N deficits, Normal affect [place].AT,PERRAL Supple Neck,No JVD, No cervical  lymphadenopathy appriciated.  Symmetrical Chest wall movement, Good air movement bilaterally, CTAB RRR,No Gallops,Rubs or new Murmurs, No Parasternal Heave +ve B.Sounds, Abd Soft, No tenderness, No organomegaly appriciated, No rebound - guarding or rigidity. No Cyanosis, Clubbing or edema, No new Rash or bruise      Data Review:    CBC  Recent Labs Lab 02/13/17 1423 02/14/17 0601 02/15/17 0440  WBC 7.4 5.1 7.6  HGB 12.5 11.5* 9.7*  HCT 38.6 35.2* 31.0*  PLT 129* 115* 149*  MCV 97.5 99.2 98.7  MCH 31.6 32.4 30.9  MCHC 32.4 32.7 31.3  RDW 15.8* 15.8* 15.8*  LYMPHSABS 0.4*  --   --   MONOABS 0.3  --   --   EOSABS 0.0  --   --   BASOSABS 0.0  --   --     Chemistries   Recent Labs Lab 02/13/17 1423 02/14/17 0601 02/15/17 0440  NA 136 136 136  K 4.2 4.3 4.7  CL 104 105 105  CO2 23 22 23   GLUCOSE 178* 126* 133*  BUN 32* 31* 37*  CREATININE 2.13* 2.09* 2.49*  CALCIUM 9.0 8.6* 8.4*  AST 44*  --  40  ALT 28  --  18  ALKPHOS 156*  --  109  BILITOT 1.8*  --  1.3*   ------------------------------------------------------------------------------------------------------------------ No results for input(s): CHOL, HDL, LDLCALC, TRIG, CHOLHDL, LDLDIRECT in the last 72 hours.  Lab Results  Component Value Date   HGBA1C 5.7 (H) 06/19/2016   ------------------------------------------------------------------------------------------------------------------ No results for input(s): TSH, T4TOTAL, T3FREE, THYROIDAB in the last 72 hours.  Invalid input(s): FREET3 ------------------------------------------------------------------------------------------------------------------ No results for input(s): VITAMINB12, FOLATE, FERRITIN, TIBC, IRON, RETICCTPCT in the last 72 hours.  Coagulation profile No results for input(s): INR, PROTIME in the last 168 hours.  No results for input(s): DDIMER in the last 72 hours.  Cardiac Enzymes No results for input(s): CKMB, TROPONINI,  MYOGLOBIN in the last 168 hours.  Invalid input(s): CK ------------------------------------------------------------------------------------------------------------------    Component Value Date/Time   BNP 2,528.5 (H) 02/14/2017 1230    Inpatient Medications  Scheduled Meds: . aspirin EC  325 mg Oral Q breakfast  . feeding supplement (GLUCERNA SHAKE)  237 mL Oral BID BM  . hydrochlorothiazide  12.5 mg Oral Daily  . insulin aspart  0-15 Units Subcutaneous TID WC  . insulin glargine  5 Units Subcutaneous Daily  . isosorbide-hydrALAZINE  1 tablet Oral TID  . levothyroxine  50 mcg Oral QAC breakfast  . losartan  50 mg Oral Daily  . metoprolol succinate  50 mg Oral Daily  . multivitamin with minerals  1 tablet Oral Daily  . pantoprazole  40 mg Oral Daily  . senna  1 tablet Oral BID   Continuous Infusions: . lactated ringers 10 mL/hr at 02/14/17 2000   PRN Meds:.acetaminophen **OR** acetaminophen, albuterol, hydrALAZINE, HYDROcodone-acetaminophen, menthol-cetylpyridinium **OR** phenol, metoCLOPramide **OR** metoCLOPramide (REGLAN) injection, morphine injection, ondansetron **OR** ondansetron (ZOFRAN) IV  Micro Results Recent Results (from the past 240 hour(s))  Surgical pcr screen     Status: None   Collection Time: 02/14/17  2:09 AM  Result Value Ref Range Status   MRSA, PCR NEGATIVE NEGATIVE Final   Staphylococcus aureus NEGATIVE NEGATIVE Final    Comment:        The Xpert SA Assay (FDA approved for NASAL specimens in patients over 105 years of age), is one component of a comprehensive surveillance program.  Test performance has been validated by [facility] for patients greater than or equal to 7 year old. It is not intended to diagnose infection nor to guide or monitor treatment.     Radiology Reports Dg Chest 1 View  Result Date: 02/13/2017 CLINICAL DATA:  Fall. EXAM: CHEST 1 VIEW COMPARISON:  06/19/2016 FINDINGS: New cardiopericardial enlargement with possible  pericardial effusion morphology. New pleural effusion on the right that is small volume. Vascular congestion. No air bronchogram or pneumothorax. Osteopenia and diffuse spondylosis. No acute osseous finding. IMPRESSION: 1. Cardiopericardial enlargement that is new from 2017. There could be a pericardial effusion based on the shape. 2. Small volume right pleural fluid. 3. Pulmonary vascular congestion. Electronically Signed   By: Marnee Spring M.D.   On: 02/13/2017 15:04   Pelvis Portable  Result Date: 02/14/2017 CLINICAL DATA:  Right hip replacement. EXAM: PORTABLE PELVIS 1-2 VIEWS COMPARISON:  None. FINDINGS: The patient is status post right hip replacement. Hardware is in good position. Skin staples and postoperative air are identified. Atherosclerosis. IMPRESSION: Right hip replacement as above. Electronically Signed   By: Gerome Sam III M.D   On: 02/14/2017 19:43   Dg Knee Complete 4 Views Right  Result Date: 02/13/2017 CLINICAL DATA:  Right knee pain after  fall today. EXAM: RIGHT KNEE - COMPLETE 4+ VIEW COMPARISON:  None. FINDINGS: No evidence of fracture, dislocation, or joint effusion. Severe narrowing of the medial and lateral joint spaces is noted. Spurring of the patella is noted. Vascular calcifications are noted. IMPRESSION: Severe degenerative joint disease. No acute abnormality seen in the right knee. Electronically Signed   By: Lupita Raider, M.D.   On: 02/13/2017 15:05   Dg Hip Unilat W Or Wo Pelvis 2-3 Views Right  Result Date: 02/13/2017 CLINICAL DATA:  Fall.  Right hip and knee pain.  Initial encounter. EXAM: DG HIP (WITH OR WITHOUT PELVIS) 2-3V RIGHT COMPARISON:  None. FINDINGS: The right femur appears foreshortened and is in external rotation with appearance compatible with a femoral neck fracture. Detailed assessment is limited by external rotation. The right femoral head remains seated in the acetabulum. Vascular calcifications are noted. IMPRESSION: Right femoral neck  fracture. Electronically Signed   By: Sebastian Ache M.D.   On: 02/13/2017 15:10   US Abdomen Limited Ruq  Result Date: 02/14/2017 CLINICAL DATA:  Abnormal liver functions tests. History diabetes, chronic kidney disease, and hypertension. EXAM: ULTRASOUND ABDOMEN LIMITED RIGHT UPPER QUADRANT COMPARISON:  CT of the abdomen and pelvis on 04/14/2016 FINDINGS: Gallbladder: Gallbladder has a normal appearance. Gallbladder wall is 2.3 mm, within normal limits. No stones or pericholecystic fluid. No sonographic Murphy's sign. Common bile duct: Diameter: 1.8 mm Liver: Hepatic echotexture is normal in appearance. Numerous hyperechoic foci are identified within the liver, consistent with hepatic granulomata. No suspicious liver lesions are identified. Additional: Right pleural effusion is present. Note is made of echogenic right kidney. IMPRESSION: 1. No evidence for acute cholecystitis. 2. Numerous hepatic granulomata. 3. Echogenic right kidney, consistent with history of chronic kidney disease. Electronically Signed   By: Norva Pavlov M.D.   On: 02/14/2017 16:30    Time Spent in minutes  30   Pearson Grippe M.D on 02/15/2017 at 7:45 AM  Between 7am to 7pm - Pager - [phone]  After 7pm go to [email or web address] - password [facility]  [facility] -  Office  [phone]

## 2017-02-15 NOTE — Evaluation (Signed)
Physical Therapy Evaluation Patient Details Name: Cynthia Todd MRN: [id number] DOB: 07/18/1919 Today's Date: 02/15/2017   History of Present Illness  83 year old female who lives with her family at home who fellgetting off of the toilet, resutling in Hip fracture; now s/p R hip Hemiarthroplasty, WBAT, Post Prec;  has a past medical history of Chronic diastolic CHF (congestive heart failure), NYHA class 2 (HCC) (2013); CKD (chronic kidney disease) stage 3, GFR 30-59 ml/min (06/19/2016); Dementia; Diabetes mellitus; GERD (gastroesophageal reflux disease) (06/19/2016); Hypertension; Stroke [facility](HCC) (06/20/2016); and Thyroid nodule (09/28/2011).  Clinical Impression   Patient is s/p above surgery resulting in functional limitations due to the deficits listed below (see PT Problem List). Began functional mobility education as well as education in precautions; Requiring Total assist with all aspects of mobility; Recommending SNF for post-acute rehab to maximize independence and safety with mobility prior to dc home with family;   Patient will benefit from skilled PT to increase their independence and safety with mobility to allow discharge to the venue listed below.       Follow Up Recommendations SNF    Equipment Recommendations  Rolling walker with 5" wheels;3in1 (PT)    Recommendations for Other Services       Precautions / Restrictions Precautions Precautions: Posterior Hip;Fall Precaution Booklet Issued: Yes (comment) Precaution Comments: Educated pt and family on posterior hip precautions Required Braces or Orthoses: Knee Immobilizer - Right Restrictions Weight Bearing Restrictions: Yes RLE Weight Bearing: Weight bearing as tolerated      Mobility  Bed Mobility Overal bed mobility: Needs Assistance Bed Mobility: Supine to Sit     Supine to sit: Max assist;+2 for physical assistance     General bed mobility comments: Assist for LEs to EOB, trunk elevation and scooting hips out to  EOB.  Transfers Overall transfer level: Needs assistance Equipment used: Rolling walker (2 wheeled) Transfers: Sit to/from [employer];Stand Pivot Transfers Sit to Stand: Total assist;+2 physical assistance Stand pivot transfers: Total assist;+2 physical assistance       General transfer comment: Total assist +2 for sit to stand from EOB with use of RW. Total assist +2 for stand pivot toward L side to chair.   Ambulation/Gait                Stairs            Wheelchair Mobility    Modified Rankin (Stroke Patients Only)       Balance Overall balance assessment: Needs assistance Sitting-balance support: Feet supported;Bilateral upper extremity supported Sitting balance-Leahy Scale: Poor   Postural control: Left lateral lean Standing balance support: Bilateral upper extremity supported Standing balance-Leahy Scale: Zero Standing balance comment: Total assist to maintain balance, heavy L lateral lean                             Pertinent Vitals/Pain Pain Assessment: Faces Faces Pain Scale: Hurts a little bit Pain Location: presumed R hip Pain Descriptors / Indicators: Grimacing Pain Intervention(s): Monitored during session    Home Living Family/patient expects to be discharged to:: Private residence Living Arrangements: Children Available Help at Discharge: Family;Available 24 hours/day Type of Home: House Home Access: Level entry     Home Layout: One level Home Equipment: Cane - single point      Prior Function Level of Independence: Needs assistance   Gait / Transfers Assistance Needed: cane for mobility  ADL's / Homemaking Assistance Needed: assist for bathing  Hand Dominance   Dominant Hand: Right    Extremity/Trunk Assessment   Upper Extremity Assessment Upper Extremity Assessment: Generalized weakness    Lower Extremity Assessment Lower Extremity Assessment: RLE deficits/detail RLE Deficits / Details: Grossly decr  AROM and strength R hip limited by pain postop    Cervical / Trunk Assessment Cervical / Trunk Assessment: Kyphotic  Communication   Communication: Prefers language other than English;Other (comment) ([place]i; family assisted with interpreting)  Cognition Arousal/Alertness: Awake/alert Behavior During Therapy: WFL for tasks assessed/performed Overall Cognitive Status: History of cognitive impairments - at baseline                                        General Comments General comments (skin integrity, edema, etc.): SpO2 down to 86% on RA with activity; reapplied 2L supplemental O2 with rise in SpO2 to mid 90s,    Exercises     Assessment/Plan    PT Assessment Patient needs continued PT services  PT Problem List Decreased strength;Decreased range of motion;Decreased activity tolerance;Decreased balance;Decreased mobility;Decreased coordination;Decreased cognition;Decreased knowledge of use of DME;Decreased safety awareness;Decreased knowledge of precautions;Cardiopulmonary status limiting activity;Pain       PT Treatment Interventions DME instruction;Gait training;Functional mobility training;Therapeutic activities;Therapeutic exercise;Balance training;Cognitive remediation;Patient/family education    PT Goals (Current goals can be found in the Care Plan section)  Acute Rehab PT Goals Patient Stated Goal: none stated PT Goal Formulation: With patient/family Time For Goal Achievement: 03/01/17 Potential to Achieve Goals: Fair    Frequency Min 3X/week   Barriers to discharge        Co-evaluation PT/OT/SLP Co-Evaluation/Treatment: Yes Reason for Co-Treatment: Complexity of the patient's impairments (multi-system involvement) PT goals addressed during session: Mobility/safety with mobility         AM-PAC PT "6 Clicks" Daily Activity  Outcome Measure Difficulty turning over in bed (including adjusting bedclothes, sheets and blankets)?: Total Difficulty  moving from lying on back to sitting on the side of the bed? : Total Difficulty sitting down on and standing up from a chair with arms (e.g., wheelchair, bedside commode, etc,.)?: Total Help needed moving to and from a bed to chair (including a wheelchair)?: Total Help needed walking in hospital room?: Total Help needed climbing 3-5 steps with a railing? : Total 6 Click Score: 6    End of Session Equipment Utilized During Treatment: Gait belt;Right knee immobilizer Activity Tolerance: Patient tolerated treatment well Patient left: in chair;with call bell/phone within reach;with family/visitor present Nurse Communication: Mobility status PT Visit Diagnosis: Unsteadiness on feet (R26.81);Other abnormalities of gait and mobility (R26.89);Pain Pain - Right/Left: Right Pain - part of body: Hip    Time: 4098-1191 PT Time Calculation (min) (ACUTE ONLY): 29 min   Charges:   PT Evaluation $PT Eval Moderate Complexity: 1 Procedure     PT G Codes:        Cynthia Todd, PT  Acute Rehabilitation Services Pager [phone] Office [phone]   Cynthia Todd 02/15/2017, 5:23 PM

## 2017-02-15 NOTE — Progress Notes (Signed)
Foley catheter removed at this time.

## 2017-02-15 NOTE — Evaluation (Signed)
Occupational Therapy Evaluation Patient Details Name: Cynthia Todd MRN: [id number] DOB: 04/23/1919 Today's Date: 02/15/2017    History of Present Illness 83 year old female who lives with her family at home who fellgetting off of the toilet, resutling in Hip fracture; now s/p R hip Hemiarthroplasty, WBAT, Post Prec;  has a past medical history of Chronic diastolic CHF (congestive heart failure), NYHA class 2 (HCC) (2013); CKD (chronic kidney disease) stage 3, GFR 30-59 ml/min (06/19/2016); Dementia; Diabetes mellitus; GERD (gastroesophageal reflux disease) (06/19/2016); Hypertension; Stroke [facility](HCC) (06/20/2016); and Thyroid nodule (09/28/2011).   Clinical Impression   Per family, pt required min assist for ADL PTA. Currently pt requires total assist +2 for stand pivot transfer and max-total assist for ADL. Began education with pt and family regarding posterior hip precautions. Recommending SNF for follow up to maximize independence and safety with ADL and functional mobility prior to return home. Pt would benefit from continued skilled OT to address established goals.    Follow Up Recommendations  SNF;Supervision/Assistance - 24 hour    Equipment Recommendations  Other (comment) (TBD at next venue)    Recommendations for Other Services       Precautions / Restrictions Precautions Precautions: Posterior Hip;Fall Precaution Booklet Issued: Yes (comment) Precaution Comments: Educated pt and family on posterior hip precautions Required Braces or Orthoses: Knee Immobilizer - Right Restrictions Weight Bearing Restrictions: Yes RLE Weight Bearing: Weight bearing as tolerated      Mobility Bed Mobility Overal bed mobility: Needs Assistance Bed Mobility: Supine to Sit     Supine to sit: Max assist;+2 for physical assistance     General bed mobility comments: Assist for LEs to EOB, trunk elevation and scooting hips out to EOB.  Transfers Overall transfer level: Needs  assistance Equipment used: Rolling walker (2 wheeled) Transfers: Sit to/from [employer];Stand Pivot Transfers Sit to Stand: Total assist;+2 physical assistance Stand pivot transfers: Total assist;+2 physical assistance       General transfer comment: Total assist +2 for sit to stand from EOB with use of RW. Total assist +2 for stand pivot toward L side to chair.     Balance Overall balance assessment: Needs assistance Sitting-balance support: Feet supported;Bilateral upper extremity supported Sitting balance-Leahy Scale: Poor   Postural control: Left lateral lean Standing balance support: Bilateral upper extremity supported Standing balance-Leahy Scale: Zero Standing balance comment: Total assist to maintain balance, heavy L lateral lean                           ADL either performed or assessed with clinical judgement   ADL Overall ADL's : Needs assistance/impaired                                       General ADL Comments: pt currently max-total assist for ADL, total assist +2 for stand pivot transfer. Pt and family educated on posterior hip precautions.     Vision         Perception     Praxis      Pertinent Vitals/Pain Pain Assessment: No/denies pain     Hand Dominance Right   Extremity/Trunk Assessment Upper Extremity Assessment Upper Extremity Assessment: Generalized weakness   Lower Extremity Assessment Lower Extremity Assessment: Defer to PT evaluation   Cervical / Trunk Assessment Cervical / Trunk Assessment: Kyphotic   Communication Communication Communication: Prefers language other than English (family interpreting)  Cognition Arousal/Alertness: Awake/alert Behavior During Therapy: WFL for tasks assessed/performed Overall Cognitive Status: History of cognitive impairments - at baseline                                     General Comments  SpO2 down to 86% on RA with activity; reapplied 2L supplemental O2  with rise in SpO2 to mid 90s,    Exercises     Shoulder Instructions      Home Living Family/patient expects to be discharged to:: Private residence Living Arrangements: Children Available Help at Discharge: Family;Available 24 hours/day Type of Home: House Home Access: Level entry     Home Layout: One level     Bathroom Shower/Tub: Chief Strategy Officer: Handicapped height     Home Equipment: Cane - single point          Prior Functioning/Environment Level of Independence: Needs assistance  Gait / Transfers Assistance Needed: cane for mobility ADL's / Homemaking Assistance Needed: assist for bathing            OT Problem List: Decreased strength;Decreased range of motion;Decreased activity tolerance;Impaired balance (sitting and/or standing);Decreased cognition;Decreased knowledge of use of DME or AE;Decreased knowledge of precautions;Cardiopulmonary status limiting activity      OT Treatment/Interventions: Self-care/ADL training;Therapeutic exercise;Energy conservation;DME and/or AE instruction;Therapeutic activities;Patient/family education;Balance training    OT Goals(Current goals can be found in the care plan section) Acute Rehab OT Goals Patient Stated Goal: none stated OT Goal Formulation: With patient/family Time For Goal Achievement: 03/01/17 Potential to Achieve Goals: Fair ADL Goals Pt Will Transfer to Toilet: with max assist;stand pivot transfer;bedside commode Pt Will Perform Toileting - Clothing Manipulation and hygiene: with min assist;sitting/lateral leans Additional ADL Goal #1: Pt will independently verbally recall 3/3 posterior hip precautions. Additional ADL Goal #2: Pt will perform bed mobility with min assist as precursor to ADL and mobility.  OT Frequency: Min 2X/week   Barriers to D/C:            Co-evaluation PT/OT/SLP Co-Evaluation/Treatment: Yes Reason for Co-Treatment: Complexity of the patient's impairments  (multi-system involvement);For patient/therapist safety          AM-PAC PT "6 Clicks" Daily Activity     Outcome Measure Help from another person eating meals?: A Little Help from another person taking care of personal grooming?: A Lot Help from another person toileting, which includes using toliet, bedpan, or urinal?: Total Help from another person bathing (including washing, rinsing, drying)?: Total Help from another person to put on and taking off regular upper body clothing?: Total Help from another person to put on and taking off regular lower body clothing?: Total 6 Click Score: 9   End of Session Equipment Utilized During Treatment: Gait belt;Rolling walker;Right knee immobilizer;Oxygen Nurse Communication: Mobility status;Precautions;Weight bearing status  Activity Tolerance: Patient tolerated treatment well Patient left: in chair;with call bell/phone within reach;with chair alarm set;with family/visitor present  OT Visit Diagnosis: Other abnormalities of gait and mobility (R26.89);Muscle weakness (generalized) (M62.81);History of falling (Z91.81);Unsteadiness on feet (R26.81)                Time: 1610-9604 OT Time Calculation (min): 29 min Charges:  OT General Charges $OT Visit: 1 Procedure OT Evaluation $OT Eval Moderate Complexity: 1 Procedure G-Codes:     Cynthia Todd A. Brett Albino, M.S., OTR/L Pager: [phone]  Gaye Alken 02/15/2017, 4:38 PM

## 2017-02-15 NOTE — Progress Notes (Signed)
   PATIENT ID: Fraser DinMaryan M Broadwell   1 Day Post-Op Procedure(s) (LRB): RIGHT HIP HEMIARTHROPLASTY (Right)  Subjective: Daughter translated for patient and states pain is tolerable and comfortable at rest. No complaints with right hip. Coughing some productive sputum.   Objective:  Vitals:   02/14/17 2328 02/15/17 0441  BP: (!) 190/78 (!) 139/41  Pulse: 74 71  Resp:    Temp: 98.6 F (37 C) 97.9 F (36.6 C)     R hip dressing c/d/i Wiggles toes, distally NVI  Labs:   Recent Labs  02/13/17 1423 02/14/17 0601 02/15/17 0440  HGB 12.5 11.5* 9.7*   Recent Labs  02/14/17 0601 02/15/17 0440  WBC 5.1 7.6  RBC 3.55* 3.14*  HCT 35.2* 31.0*  PLT 115* 149*   Recent Labs  02/14/17 0601 02/15/17 0440  NA 136 136  K 4.3 4.7  CL 105 105  CO2 22 23  BUN 31* 37*  CREATININE 2.09* 2.49*  GLUCOSE 126* 133*  CALCIUM 8.6* 8.4*    Assessment and Plan: 1 day s/p R hip hemi for fracture Continue current pain mgmt, limit narcotics Cough- encouraged [facility] Medicine following and we appreciate their care WBAT  posteriop hip precautions  VTE proph: ASA, SCDs

## 2017-02-16 ENCOUNTER — Inpatient Hospital Stay (HOSPITAL_COMMUNITY): Payer: Medicare Other

## 2017-02-16 ENCOUNTER — Encounter (HOSPITAL_COMMUNITY): Payer: Self-pay | Admitting: Orthopedic Surgery

## 2017-02-16 DIAGNOSIS — N179 Acute kidney failure, unspecified: Secondary | ICD-10-CM

## 2017-02-16 LAB — URINALYSIS, ROUTINE W REFLEX MICROSCOPIC
BILIRUBIN URINE: NEGATIVE
Glucose, UA: NEGATIVE mg/dL
Hgb urine dipstick: NEGATIVE
KETONES UR: 5 mg/dL — AB
Leukocytes, UA: NEGATIVE
Nitrite: NEGATIVE
Protein, ur: 30 mg/dL — AB
SPECIFIC GRAVITY, URINE: 1.017 (ref 1.005–1.030)
pH: 5 (ref 5.0–8.0)

## 2017-02-16 LAB — GLUCOSE, CAPILLARY
GLUCOSE-CAPILLARY: 162 mg/dL — AB (ref 65–99)
GLUCOSE-CAPILLARY: 130 mg/dL — AB (ref 65–99)
Glucose-Capillary: 158 mg/dL — ABNORMAL HIGH (ref 65–99)
Glucose-Capillary: 127 mg/dL — ABNORMAL HIGH (ref 65–99)

## 2017-02-16 LAB — BASIC METABOLIC PANEL
Anion gap: 9 (ref 5–15)
BUN: 52 mg/dL — AB (ref 6–20)
CALCIUM: 8.3 mg/dL — AB (ref 8.9–10.3)
CHLORIDE: 103 mmol/L (ref 101–111)
CO2: 20 mmol/L — ABNORMAL LOW (ref 22–32)
CREATININE: 2.85 mg/dL — AB (ref 0.44–1.00)
GFR calc Af Amer: 15 mL/min — ABNORMAL LOW (ref >60)
GFR calc non Af Amer: 13 mL/min — ABNORMAL LOW (ref >60)
Glucose, Bld: 130 mg/dL — ABNORMAL HIGH (ref 65–99)
Potassium: 5 mmol/L (ref 3.5–5.1)
SODIUM: 132 mmol/L — AB (ref 135–145)

## 2017-02-16 LAB — CBC
HCT: 30.2 % — ABNORMAL LOW (ref 36.0–46.0)
HEMOGLOBIN: 9.8 g/dL — AB (ref 12.0–15.0)
MCH: 31.6 pg (ref 26.0–34.0)
MCHC: 32.5 g/dL (ref 30.0–36.0)
MCV: 97.4 fL (ref 78.0–100.0)
Platelets: 145 10*3/uL — ABNORMAL LOW (ref 150–400)
RBC: 3.1 MIL/uL — ABNORMAL LOW (ref 3.87–5.11)
RDW: 15.8 % — AB (ref 11.5–15.5)
WBC: 8.7 10*3/uL (ref 4.0–10.5)

## 2017-02-16 LAB — HEPATITIS PANEL, ACUTE
HCV Ab: <0.1 s/co ratio (ref 0.0–0.9)
HEP A IGM: NEGATIVE
HEP B C IGM: NEGATIVE
HEP B S AG: NEGATIVE

## 2017-02-16 LAB — SODIUM, URINE, RANDOM

## 2017-02-16 MED ORDER — MORPHINE SULFATE (PF) 4 MG/ML IV SOLN
0.5000 mg | INTRAVENOUS | Status: DC | PRN
  Administered 2017-02-16 – 2017-02-17 (×2): 0.52 mg via INTRAVENOUS
  Filled 2017-02-16 (×2): qty 1

## 2017-02-16 MED ORDER — HYDROCODONE-ACETAMINOPHEN 5-325 MG PO TABS: 1.0000 | ORAL_TABLET | Freq: Four times a day (QID) | ORAL | 0 refills | Status: DC | PRN

## 2017-02-16 MED ORDER — ASPIRIN 325 MG PO TBEC: 325.0000 mg | DELAYED_RELEASE_TABLET | Freq: Every day | ORAL | 0 refills | Status: AC

## 2017-02-16 MED ORDER — HYDROCODONE-ACETAMINOPHEN 5-325 MG PO TABS
1.0000 | ORAL_TABLET | Freq: Four times a day (QID) | ORAL | Status: DC | PRN
  Administered 2017-02-16 – 2017-02-18 (×4): 1 via ORAL
  Filled 2017-02-16 (×4): qty 1

## 2017-02-16 MED ORDER — SODIUM CHLORIDE 0.9 % IV SOLN
INTRAVENOUS | Status: AC
  Administered 2017-02-16: 09:00:00 via INTRAVENOUS

## 2017-02-16 NOTE — Progress Notes (Signed)
Patient ID: Cynthia Todd, female   DOB: 01/09/1919, 83 y.o.   MRN: [id number]                                                                PROGRESS NOTE                                                                                                                                                                                                             Patient Demographics:    Cynthia Todd, is a [age] y.o. female, DOB - 02/06/1919, [id number]  Admit date - 02/13/2017   Admitting Physician Jones BroomJustin Chandler, MD  Outpatient Primary MD for the patient is Fleet ContrasAvbuere, Edwin, MD  LOS - 3  Outpatient Specialists:     Chief Complaint  Patient presents with  . Fall  . Knee Pain       Brief Narrative    83 y.o.femalewith a past medical history of chronic diastolic CHF, diabetes, who had a mechanical fall earlier today resulting in pain in the right hip as well as knee. Patient does not speak any English, so not much history was available. She was noted to be grimacing and so was thought to be in pain. No chest pain or shortness of breath. History is very limited.    Subjective:    Cynthia Todd today has slight difficulty sleeping last nite.  Afebrile.  POD#2.  R THA.  Pt family not certain want SNF for rehab. No chest pain, No abdominal pain - No Nausea, No new weakness tingling or numbness, No Cough - SOB.    Assessment  & Plan :    Active Problems:   Diabetes mellitus (HCC)   Diastolic heart failure, NYHA class 1 (HCC)   Pulmonary HTN (HCC)   CKD (chronic kidney disease) stage 3, GFR 30-59 ml/min   Closed right hip fracture (HCC)   Cardiomegaly   Closed fracture of neck of right femur (HCC)   Abnormal liver function   S/P hip hemiarthroplasty   ARF (acute renal failure) (HCC)   ARF on CRF Hydrate gently with ns iv Renal ultrasound Check ua, urine sodium, urine creatinine, urine eosinophils Pt having poor po intake  Hyponatremia Hydrate with ns iv  Right hip fracture:  S/p R  THA POD #2 Orthopedics has  been consulted. Dr. Ave Filter wanted her to go to surgery 6/15 . Appreciateorthopedic input  Cardiomegaly, CHF (diastolic), MR, Mild-Moderate AR(cardiac echo 02/14/2017) Stable  Chronic kidney disease stage 3 (baseline 1.3), Mild Acute on CRF Hold diuretic for now, Hold Losartan for now check cmp in am  Diabetes mellitus type II: She is on Lantus 1/2 normal dose Sliding scale insulin coverage.  Hypertension uncontrolled,  Cont Bidil tid Hydralazine iv prn   Abnormal liver function RUQ ultrasound 6/16=> numerous hepatic granuloma  Thrombocytopenia Check cbc in am  Anemia Likely post op Repeat cbc in am  DVT Prophylaxis:SCDs for now Code Status:Full code Family Communication:granddaughter Consults called:Orthopedics Admission status:Inpatient   Lab Results  Component Value Date   PLT 145 (L) 02/16/2017      Anti-infectives    Start     Dose/Rate Route Frequency Ordered Stop   02/14/17 1830  ceFAZolin (ANCEF) IVPB 2g/100 mL premix     2 g 200 mL/hr over 30 Minutes Intravenous Every 6 hours 02/14/17 1822 02/15/17 0629   02/14/17 1500  ceFAZolin (ANCEF) IVPB 2g/100 mL premix     2 g 200 mL/hr over 30 Minutes Intravenous On call to O.R. 02/14/17 1437 02/14/17 1606        Objective:   Vitals:   02/15/17 0441 02/15/17 1605 02/15/17 1900 02/15/17 2150  BP: (!) 139/41 (!) 147/50  (!) 149/59  Pulse: 71 73  69  Resp:  16  16  Temp: 97.9 F (36.6 C) 97.7 F (36.5 C)  98.9 F (37.2 C)  TempSrc: Axillary Axillary  Oral  SpO2: 94% 92%  94%  Weight:   54 kg (119 lb 0.8 oz)   Height:   5' (1.524 m)     Wt Readings from Last 3 Encounters:  02/15/17 54 kg (119 lb 0.8 oz)  06/20/16 54.1 kg (119 lb 4.3 oz)  11/19/14 60.6 kg (133 lb 9.6 oz)     Intake/Output Summary (Last 24 hours) at 02/16/17 0706 Last data filed at 02/16/17 0515  Gross per 24 hour  Intake            602.5 ml  Output               90 ml  Net             512.5 ml     Physical Exam  Awake Alert, Oriented X 3, No new F.N deficits, Normal affect [place].AT,PERRAL Supple Neck,No JVD, No cervical lymphadenopathy appriciated.  Symmetrical Chest wall movement, Good air movement bilaterally, CTAB RRR,No Gallops,Rubs or new Murmurs, No Parasternal Heave +ve B.Sounds, Abd Soft, No tenderness, No organomegaly appriciated, No rebound - guarding or rigidity. No Cyanosis, Clubbing or edema, No new Rash or bruise      Data Review:    CBC  Recent Labs Lab 02/13/17 1423 02/14/17 0601 02/15/17 0440 02/16/17 0507  WBC 7.4 5.1 7.6 8.7  HGB 12.5 11.5* 9.7* 9.8*  HCT 38.6 35.2* 31.0* 30.2*  PLT 129* 115* 149* 145*  MCV 97.5 99.2 98.7 97.4  MCH 31.6 32.4 30.9 31.6  MCHC 32.4 32.7 31.3 32.5  RDW 15.8* 15.8* 15.8* 15.8*  LYMPHSABS 0.4*  --   --   --   MONOABS 0.3  --   --   --   EOSABS 0.0  --   --   --   BASOSABS 0.0  --   --   --     Chemistries   Recent Labs Lab 02/13/17 1423  02/14/17 0601 02/15/17 0440 02/16/17 0507  NA 136 136 136 132*  K 4.2 4.3 4.7 5.0  CL 104 105 105 103  CO2 23 22 23  20*  GLUCOSE 178* 126* 133* 130*  BUN 32* 31* 37* 52*  CREATININE 2.13* 2.09* 2.49* 2.85*  CALCIUM 9.0 8.6* 8.4* 8.3*  AST 44*  --  40  --   ALT 28  --  18  --   ALKPHOS 156*  --  109  --   BILITOT 1.8*  --  1.3*  --    ------------------------------------------------------------------------------------------------------------------ No results for input(s): CHOL, HDL, LDLCALC, TRIG, CHOLHDL, LDLDIRECT in the last 72 hours.  Lab Results  Component Value Date   HGBA1C 5.7 (H) 06/19/2016   ------------------------------------------------------------------------------------------------------------------ No results for input(s): TSH, T4TOTAL, T3FREE, THYROIDAB in the last 72 hours.  Invalid input(s): FREET3 ------------------------------------------------------------------------------------------------------------------ No results for  input(s): VITAMINB12, FOLATE, FERRITIN, TIBC, IRON, RETICCTPCT in the last 72 hours.  Coagulation profile No results for input(s): INR, PROTIME in the last 168 hours.  No results for input(s): DDIMER in the last 72 hours.  Cardiac Enzymes No results for input(s): CKMB, TROPONINI, MYOGLOBIN in the last 168 hours.  Invalid input(s): CK ------------------------------------------------------------------------------------------------------------------    Component Value Date/Time   BNP 2,528.5 (H) 02/14/2017 1230    Inpatient Medications  Scheduled Meds: . aspirin EC  325 mg Oral Q breakfast  . feeding supplement (GLUCERNA SHAKE)  237 mL Oral BID BM  . insulin aspart  0-15 Units Subcutaneous TID WC  . insulin glargine  5 Units Subcutaneous Daily  . isosorbide-hydrALAZINE  1 tablet Oral TID  . levothyroxine  50 mcg Oral QAC breakfast  . metoprolol succinate  50 mg Oral Daily  . multivitamin with minerals  1 tablet Oral Daily  . pantoprazole  40 mg Oral Daily  . senna  1 tablet Oral BID   Continuous Infusions: . sodium chloride    . lactated ringers 10 mL/hr at 02/14/17 2000   PRN Meds:.acetaminophen **OR** acetaminophen, albuterol, hydrALAZINE, HYDROcodone-acetaminophen, menthol-cetylpyridinium **OR** phenol, metoCLOPramide **OR** metoCLOPramide (REGLAN) injection, morphine injection, ondansetron **OR** ondansetron (ZOFRAN) IV  Micro Results Recent Results (from the past 240 hour(s))  Surgical pcr screen     Status: None   Collection Time: 02/14/17  2:09 AM  Result Value Ref Range Status   MRSA, PCR NEGATIVE NEGATIVE Final   Staphylococcus aureus NEGATIVE NEGATIVE Final    Comment:        The Xpert SA Assay (FDA approved for NASAL specimens in patients over 45 years of age), is one component of a comprehensive surveillance program.  Test performance has been validated by [facility] for patients greater than or equal to 55 year old. It is not intended to diagnose  infection nor to guide or monitor treatment.     Radiology Reports Dg Chest 1 View  Result Date: 02/13/2017 CLINICAL DATA:  Fall. EXAM: CHEST 1 VIEW COMPARISON:  06/19/2016 FINDINGS: New cardiopericardial enlargement with possible pericardial effusion morphology. New pleural effusion on the right that is small volume. Vascular congestion. No air bronchogram or pneumothorax. Osteopenia and diffuse spondylosis. No acute osseous finding. IMPRESSION: 1. Cardiopericardial enlargement that is new from 2017. There could be a pericardial effusion based on the shape. 2. Small volume right pleural fluid. 3. Pulmonary vascular congestion. Electronically Signed   By: Marnee Spring M.D.   On: 02/13/2017 15:04   Pelvis Portable  Result Date: 02/14/2017 CLINICAL DATA:  Right hip replacement. EXAM: PORTABLE PELVIS 1-2 VIEWS COMPARISON:  None. FINDINGS: The patient is status post right hip replacement. Hardware is in good position. Skin staples and postoperative air are identified. Atherosclerosis. IMPRESSION: Right hip replacement as above. Electronically Signed   By: Gerome Sam III M.D   On: 02/14/2017 19:43   Dg Knee Complete 4 Views Right  Result Date: 02/13/2017 CLINICAL DATA:  Right knee pain after fall today. EXAM: RIGHT KNEE - COMPLETE 4+ VIEW COMPARISON:  None. FINDINGS: No evidence of fracture, dislocation, or joint effusion. Severe narrowing of the medial and lateral joint spaces is noted. Spurring of the patella is noted. Vascular calcifications are noted. IMPRESSION: Severe degenerative joint disease. No acute abnormality seen in the right knee. Electronically Signed   By: Lupita Raider, M.D.   On: 02/13/2017 15:05   Dg Hip Unilat W Or Wo Pelvis 2-3 Views Right  Result Date: 02/13/2017 CLINICAL DATA:  Fall.  Right hip and knee pain.  Initial encounter. EXAM: DG HIP (WITH OR WITHOUT PELVIS) 2-3V RIGHT COMPARISON:  None. FINDINGS: The right femur appears foreshortened and is in external  rotation with appearance compatible with a femoral neck fracture. Detailed assessment is limited by external rotation. The right femoral head remains seated in the acetabulum. Vascular calcifications are noted. IMPRESSION: Right femoral neck fracture. Electronically Signed   By: Sebastian Ache M.D.   On: 02/13/2017 15:10   US Abdomen Limited Ruq  Result Date: 02/14/2017 CLINICAL DATA:  Abnormal liver functions tests. History diabetes, chronic kidney disease, and hypertension. EXAM: ULTRASOUND ABDOMEN LIMITED RIGHT UPPER QUADRANT COMPARISON:  CT of the abdomen and pelvis on 04/14/2016 FINDINGS: Gallbladder: Gallbladder has a normal appearance. Gallbladder wall is 2.3 mm, within normal limits. No stones or pericholecystic fluid. No sonographic Murphy's sign. Common bile duct: Diameter: 1.8 mm Liver: Hepatic echotexture is normal in appearance. Numerous hyperechoic foci are identified within the liver, consistent with hepatic granulomata. No suspicious liver lesions are identified. Additional: Right pleural effusion is present. Note is made of echogenic right kidney. IMPRESSION: 1. No evidence for acute cholecystitis. 2. Numerous hepatic granulomata. 3. Echogenic right kidney, consistent with history of chronic kidney disease. Electronically Signed   By: Norva Pavlov M.D.   On: 02/14/2017 16:30    Time Spent in minutes  30   Pearson Grippe M.D on 02/16/2017 at 7:06 AM  Between 7am to 7pm - Pager - [phone]  After 7pm go to [email or web address] - password [facility]  [facility] -  Office  [phone]

## 2017-02-16 NOTE — Discharge Instructions (Signed)
Discharge Instructions after Hip Hemiarthroplasty   You can bear weight and ambulate as tolerated on both legs Observe posterior hip precautions Use ice on the hip intermittently over the first 48 hours after surgery.  Pain medicine has been prescribed for you.  Use your medicine liberally over the first 48 hours, and then you can begin to taper your use. You may take Extra Strength Tylenol or Tylenol only in place of the pain pills. DO NOT take ANY nonsteroidal anti-inflammatory pain medications: Advil, Motrin, Ibuprofen, Aleve, Naproxen or Naprosyn.  Take aspirin or anticoagulant medication as prescribed for 2 weeks after surgery. Please notify if allergic or sensitivity to aspirin. Your dressing is waterproof. Please leave it on as long as it is clean until your first visit with Dr. Ave Filterhandler.    Please call [phone] during normal business hours or [phone] after hours for any problems. Including the following:  - excessive redness of the incisions - drainage for more than 4 days - fever of more than 101.5 F  *Please note that pain medications will not be refilled after hours or on weekends.

## 2017-02-16 NOTE — Progress Notes (Signed)
Physical Therapy Treatment Patient Details Name: Cynthia Todd M Hammond MRN: [id number] DOB: 09/08/1918 Today's Date: 02/16/2017    History of Present Illness 83 year old female who lives with her family at home who fellgetting off of the toilet, resutling in Hip fracture; now s/p R hip Hemiarthroplasty, WBAT, Post Prec;  has a past medical history of Chronic diastolic CHF (congestive heart failure), NYHA class 2 (HCC) (2013); CKD (chronic kidney disease) stage 3, GFR 30-59 ml/min (06/19/2016); Dementia; Diabetes mellitus; GERD (gastroesophageal reflux disease) (06/19/2016); Hypertension; Stroke [facility](HCC) (06/20/2016); and Thyroid nodule (09/28/2011).    PT Comments    Pt treatment limited by pt agitation. Pt aggressive during session, attempting to bite PTA and pull hair. Unable to transfer at this time for therapist safety. RN notified and aware that this behavior is new. Patient would benefit from continued skilled PT. Will continue to follow acutely.    Follow Up Recommendations  SNF     Equipment Recommendations  Rolling walker with 5" wheels;3in1 (PT)    Recommendations for Other Services       Precautions / Restrictions Precautions Precautions: Posterior Hip;Fall Precaution Booklet Issued: Yes (comment) Precaution Comments: Educated pt and family on posterior hip precautions Required Braces or Orthoses: Knee Immobilizer - Right Restrictions Weight Bearing Restrictions: Yes RLE Weight Bearing: Weight bearing as tolerated    Mobility  Bed Mobility Overal bed mobility: Needs Assistance Bed Mobility: Supine to Sit     Supine to sit: Max assist;+2 for physical assistance     General bed mobility comments: Assist for LEs to EOB, trunk elevation and scooting hips out to EOB.  Transfers                 General transfer comment: Unable to attempt transfer as pt became agressive upon sitting. Attempted to bite PTA twice when initiating transfer.   Ambulation/Gait                  Stairs            Wheelchair Mobility    Modified Rankin (Stroke Patients Only)       Balance Overall balance assessment: Needs assistance Sitting-balance support: Feet supported;Bilateral upper extremity supported Sitting balance-Leahy Scale: Poor   Postural control: Left lateral lean;Posterior lean Standing balance support: Bilateral upper extremity supported Standing balance-Leahy Scale: Zero Standing balance comment: Total assist to maintain balance, heavy L lateral and posterior lean                            Cognition Arousal/Alertness: Awake/alert Behavior During Therapy: Agitated Overall Cognitive Status: History of cognitive impairments - at baseline                                 General Comments: Pt agressive during session and attempted to bite PTA twice and pull hair.      Exercises      General Comments        Pertinent Vitals/Pain Pain Assessment: Faces Faces Pain Scale: Hurts whole lot Pain Location: presumed R hip Pain Descriptors / Indicators: Grimacing;Guarding;Moaning Pain Intervention(s): Limited activity within patient's tolerance;Monitored during session    Home Living                      Prior Function            PT Goals (current goals can now be found  in the care plan section) Acute Rehab PT Goals Patient Stated Goal: none stated PT Goal Formulation: With patient/family Time For Goal Achievement: 03/01/17 Potential to Achieve Goals: Fair Progress towards PT goals: Not progressing toward goals - comment (treatment limited by pts agitation)    Frequency    Min 3X/week      PT Plan Current plan remains appropriate    Co-evaluation              AM-PAC PT "6 Clicks" Daily Activity  Outcome Measure  Difficulty turning over in bed (including adjusting bedclothes, sheets and blankets)?: Total Difficulty moving from lying on back to sitting on the side of the bed? :  Total Difficulty sitting down on and standing up from a chair with arms (e.g., wheelchair, bedside commode, etc,.)?: Total Help needed moving to and from a bed to chair (including a wheelchair)?: Total Help needed walking in hospital room?: Total Help needed climbing 3-5 steps with a railing? : Total 6 Click Score: 6    End of Session Equipment Utilized During Treatment: Gait belt;Right knee immobilizer Activity Tolerance: Treatment limited secondary to agitation Patient left: with call bell/phone within reach;with family/visitor present;in bed Nurse Communication: Other (comment) (Notified RN of pts agression) PT Visit Diagnosis: Unsteadiness on feet (R26.81);Other abnormalities of gait and mobility (R26.89);Pain Pain - Right/Left: Right Pain - part of body: Hip     Time: 1610-9604 PT Time Calculation (min) (ACUTE ONLY): 14 min  Charges:  $Therapeutic Activity: 8-22 mins                    G Codes:       Kallie Locks, PTA Acute Rehab [phone]  Sheral Apley 02/16/2017, 11:47 AM

## 2017-02-16 NOTE — Progress Notes (Addendum)
   PATIENT ID: Cynthia Todd   2 Days Post-Op Procedure(s) (LRB): RIGHT HIP HEMIARTHROPLASTY (Right)  Subjective: Sleeping this morning, comfortable.   Objective:  Vitals:   02/15/17 2150 02/16/17 0749  BP: (!) 149/59 (!) 153/59  Pulse: 69 61  Resp: 16 16  Temp: 98.9 F (37.2 C) 98.8 F (37.1 C)     R hip dressing c/d/i Wiggles toes, distally NVI   Labs:   Recent Labs  02/13/17 1423 02/14/17 0601 02/15/17 0440 02/16/17 0507  HGB 12.5 11.5* 9.7* 9.8*   Recent Labs  02/15/17 0440 02/16/17 0507  WBC 7.6 8.7  RBC 3.14* 3.10*  HCT 31.0* 30.2*  PLT 149* 145*   Recent Labs  02/15/17 0440 02/16/17 0507  NA 136 132*  K 4.7 5.0  CL 105 103  CO2 23 20*  BUN 37* 52*  CREATININE 2.49* 2.85*  GLUCOSE 133* 130*  CALCIUM 8.4* 8.3*    Assessment and Plan: 2 days s/p R hip hemi for fracture limit narcotics, transition on norco if tolerated Medicine following and we appreciate their care D/c to SNF when cleared by medicine, from ortho standpoint okay to dc WBAT  posteriop hip precautions  VTE proph: ASA, SCDs

## 2017-02-17 LAB — COMPREHENSIVE METABOLIC PANEL
ALBUMIN: 2 g/dL — AB (ref 3.5–5.0)
ALK PHOS: 135 U/L — AB (ref 38–126)
ALT: 6 U/L — AB (ref 14–54)
AST: 35 U/L (ref 15–41)
Anion gap: 6 (ref 5–15)
BUN: 59 mg/dL — ABNORMAL HIGH (ref 6–20)
CALCIUM: 7.9 mg/dL — AB (ref 8.9–10.3)
CO2: 23 mmol/L (ref 22–32)
CREATININE: 2.89 mg/dL — AB (ref 0.44–1.00)
Chloride: 100 mmol/L — ABNORMAL LOW (ref 101–111)
GFR calc Af Amer: 15 mL/min — ABNORMAL LOW (ref >60)
GFR calc non Af Amer: 13 mL/min — ABNORMAL LOW (ref >60)
GLUCOSE: 148 mg/dL — AB (ref 65–99)
Potassium: 4.9 mmol/L (ref 3.5–5.1)
SODIUM: 129 mmol/L — AB (ref 135–145)
Total Bilirubin: 1.1 mg/dL (ref 0.3–1.2)
Total Protein: 5.9 g/dL — ABNORMAL LOW (ref 6.5–8.1)

## 2017-02-17 LAB — CBC
HEMATOCRIT: 29.4 % — AB (ref 36.0–46.0)
HEMOGLOBIN: 9.4 g/dL — AB (ref 12.0–15.0)
MCH: 30.6 pg (ref 26.0–34.0)
MCHC: 32 g/dL (ref 30.0–36.0)
MCV: 95.8 fL (ref 78.0–100.0)
Platelets: 188 10*3/uL (ref 150–400)
RBC: 3.07 MIL/uL — AB (ref 3.87–5.11)
RDW: 15.3 % (ref 11.5–15.5)
WBC: 8.3 10*3/uL (ref 4.0–10.5)

## 2017-02-17 LAB — GLUCOSE, CAPILLARY
GLUCOSE-CAPILLARY: 165 mg/dL — AB (ref 65–99)
GLUCOSE-CAPILLARY: 158 mg/dL — AB (ref 65–99)
GLUCOSE-CAPILLARY: 126 mg/dL — AB (ref 65–99)
Glucose-Capillary: 110 mg/dL — ABNORMAL HIGH (ref 65–99)

## 2017-02-17 LAB — OSMOLALITY: Osmolality: 291 mOsm/kg (ref 275–295)

## 2017-02-17 LAB — OSMOLALITY, URINE: Osmolality, Ur: 369 mOsm/kg (ref 300–900)

## 2017-02-17 LAB — CALCIUM / CREATININE RATIO, URINE
CREATININE, UR: 135.3 mg/dL
Calcium, Ur: <0.8 mg/dL

## 2017-02-17 LAB — TSH: TSH: 9.423 u[IU]/mL — ABNORMAL HIGH (ref 0.350–4.500)

## 2017-02-17 LAB — CORTISOL: Cortisol, Plasma: 16.6 ug/dL

## 2017-02-17 MED ORDER — SODIUM CHLORIDE 0.9 % IV SOLN: INTRAVENOUS | Status: AC

## 2017-02-17 NOTE — [place] FL2 (Signed)
[place] MEDICAID FL2 LEVEL OF CARE SCREENING TOOL     IDENTIFICATION  Patient Name: Cynthia Todd Birthdate: 02/13/1919 Sex: female Admission Date (Current Location): 02/13/2017  [facility] [place]   Facility and Address:  [facility], [address], [place], [place] [address]      Provider Number: [id number]  Attending Physician Name and Address:  Pearson GrippeKim, James, MD  Relative Name and Phone Number:       Current Level of Care: Hospital Recommended Level of Care: Skilled Nursing Facility Prior Approval Number:    Date Approved/Denied: 02/17/17 PASRR Number: [id number]  Discharge Plan: Home    Current Diagnoses: Patient Active Problem List   Diagnosis Date Noted  . ARF (acute renal failure) (HCC) 02/16/2017  . Abnormal liver function   . S/P hip hemiarthroplasty   . Closed fracture of neck of right femur (HCC)   . Closed right hip fracture (HCC) 02/13/2017  . Cardiomegaly 02/13/2017  . Syncope 06/19/2016  . GERD (gastroesophageal reflux disease) 06/19/2016  . CKD (chronic kidney disease) stage 3, GFR 30-59 ml/min 06/19/2016  . Hypertensive urgency 11/17/2014  . Diabetic nephropathy (HCC) 06/15/2013  . Acute respiratory failure (HCC) 06/15/2013  . Insulin dependent diabetes mellitus (HCC) 06/14/2013  . Acute on chronic diastolic CHF (congestive heart failure) (HCC) 06/13/2013  . Influenza 10/02/2011  . Diastolic heart failure, NYHA class 1 (HCC) 09/29/2011  . Pulmonary HTN (HCC) 09/29/2011  . Shortness of breath 09/28/2011  . Hypokalemia 09/28/2011  . Chest pain on breathing 09/28/2011  . Diabetes mellitus (HCC) 09/28/2011  . Malignant hypertension 09/28/2011    Orientation RESPIRATION BLADDER Height & Weight     Self  O2 Continent, Indwelling catheter Weight: 119 lb 0.8 oz (54 kg) Height:  5' (152.4 cm)  BEHAVIORAL SYMPTOMS/MOOD NEUROLOGICAL BOWEL NUTRITION STATUS      Continent Diet (See DC Summary)  AMBULATORY  STATUS COMMUNICATION OF NEEDS Skin   Extensive Assist Verbally Surgical wounds (Closed Right Hip Incision with Silver Hydrofiber dressing)                       Personal Care Assistance Level of Assistance  Bathing, Feeding, Dressing Bathing Assistance: Maximum assistance Feeding assistance: Limited assistance Dressing Assistance: Maximum assistance     Functional Limitations Info  Sight, Hearing, Speech Sight Info: Adequate Hearing Info: Adequate Speech Info: Adequate    SPECIAL CARE FACTORS FREQUENCY  PT (By licensed PT), OT (By licensed OT)     PT Frequency: 5xweek OT Frequency: 5xweek            Contractures      Additional Factors Info  Code Status, Allergies, Insulin Sliding Scale Code Status Info: Full Allergies Info: NKA   Insulin Sliding Scale Info: 15 units 3x's day; 5 units       Current Medications (02/17/2017):  This is the current hospital active medication list Current Facility-Administered Medications  Medication Dose Route Frequency Provider Last Rate Last Dose  . 0.9 %  sodium chloride infusion   Intravenous Continuous Pearson GrippeKim, James, MD 100 mL/hr at 02/17/17 0730    . acetaminophen (TYLENOL) tablet 650 mg  650 mg Oral Q6H PRN Jiles HaroldLaliberte, Danielle, PA-C       Or  . acetaminophen (TYLENOL) suppository 650 mg  650 mg Rectal Q6H PRN Jiles HaroldLaliberte, Danielle, PA-C      . albuterol (PROVENTIL) (2.5 MG/3ML) 0.083% nebulizer solution 2.5 mg  2.5 mg Nebulization Q6H PRN Rito EhrlichKrishnan,  Gokul, MD   2.5 mg at 02/14/17 2101  . aspirin EC tablet 325 mg  325 mg Oral Q breakfast Jiles Harold, PA-C   325 mg at 02/17/17 0836  . feeding supplement (GLUCERNA SHAKE) (GLUCERNA SHAKE) liquid 237 mL  237 mL Oral BID BM Pearson Grippe, MD   237 mL at 02/17/17 1400  . hydrALAZINE (APRESOLINE) injection 5 mg  5 mg Intravenous Q6H PRN Pearson Grippe, MD      . HYDROcodone-acetaminophen (NORCO/VICODIN) 5-325 MG per tablet 1 tablet  1 tablet Oral Q6H PRN Pearson Grippe, MD   1 tablet at  02/16/17 1720  . insulin aspart (novoLOG) injection 0-15 Units  0-15 Units Subcutaneous TID WC Osvaldo Shipper, MD   3 Units at 02/17/17 1248  . insulin glargine (LANTUS) injection 5 Units  5 Units Subcutaneous Daily Osvaldo Shipper, MD   5 Units at 02/17/17 1018  . isosorbide-hydrALAZINE (BIDIL) 20-37.5 MG per tablet 1 tablet  1 tablet Oral TID Pearson Grippe, MD   1 tablet at 02/17/17 1018  . lactated ringers infusion   Intravenous Continuous Kipp Brood, MD   Stopped at 02/16/17 0845  . levothyroxine (SYNTHROID, LEVOTHROID) tablet 50 mcg  50 mcg Oral QAC breakfast Osvaldo Shipper, MD   50 mcg at 02/17/17 0836  . menthol-cetylpyridinium (CEPACOL) lozenge 3 mg  1 lozenge Oral PRN Jiles Harold, PA-C       Or  . phenol (CHLORASEPTIC) mouth spray 1 spray  1 spray Mouth/Throat PRN Jiles Harold, PA-C      . metoCLOPramide (REGLAN) tablet 5-10 mg  5-10 mg Oral Q8H PRN Jiles Harold, PA-C       Or  . metoCLOPramide (REGLAN) injection 5-10 mg  5-10 mg Intravenous Q8H PRN Jiles Harold, PA-C      . metoprolol succinate (TOPROL-XL) 24 hr tablet 50 mg  50 mg Oral Daily Osvaldo Shipper, MD   50 mg at 02/17/17 1018  . morphine 4 MG/ML injection 0.52 mg  0.52 mg Intravenous Q2H PRN Pearson Grippe, MD   0.52 mg at 02/16/17 0306  . multivitamin with minerals tablet 1 tablet  1 tablet Oral Daily Osvaldo Shipper, MD   1 tablet at 02/17/17 1018  . ondansetron (ZOFRAN) tablet 4 mg  4 mg Oral Q6H PRN Jiles Harold, PA-C       Or  . ondansetron (ZOFRAN) injection 4 mg  4 mg Intravenous Q6H PRN Laliberte, Danielle, PA-C      . pantoprazole (PROTONIX) EC tablet 40 mg  40 mg Oral Daily Osvaldo Shipper, MD   40 mg at 02/17/17 1018  . senna (SENOKOT) tablet 8.6 mg  1 tablet Oral BID Osvaldo Shipper, MD   8.6 mg at 02/16/17 0848     Discharge Medications: Please see discharge summary for a list of discharge medications.  Relevant Imaging Results:  Relevant Lab Results:   Additional  Information [phone]  Tresa Moore, LCSW

## 2017-02-17 NOTE — Progress Notes (Signed)
Physical Therapy Treatment Patient Details Name: Cynthia Todd MRN: [id number] DOB: 1919/08/04 Today's Date: 02/17/2017    History of Present Illness 83 year old female who lives with her family at home who fellgetting off of the toilet, resutling in Hip fracture; now s/p R hip Hemiarthroplasty, WBAT, Post Prec;  has a past medical history of Chronic diastolic CHF (congestive heart failure), NYHA class 2 (HCC) (2013); CKD (chronic kidney disease) stage 3, GFR 30-59 ml/min (06/19/2016); Dementia; Diabetes mellitus; GERD (gastroesophageal reflux disease) (06/19/2016); Hypertension; Stroke [facility]) (06/20/2016); and Thyroid nodule (09/28/2011).    PT Comments    Pt less agitated today and able to transfer from bed to chair with total assist. Daughter present and involved in treatment. Pt would benefit from continued skilled PT to increase functional independence. Will continue to follow acutely.    Follow Up Recommendations  SNF     Equipment Recommendations  Rolling walker with 5" wheels;3in1 (PT)    Recommendations for Other Services       Precautions / Restrictions Precautions Precautions: Posterior Hip;Fall Precaution Booklet Issued: Yes (comment) Precaution Comments: Educated pt and family on posterior hip precautions Required Braces or Orthoses: Knee Immobilizer - Right Restrictions Weight Bearing Restrictions: Yes RLE Weight Bearing: Weight bearing as tolerated    Mobility  Bed Mobility Overal bed mobility: Needs Assistance Bed Mobility: Supine to Sit     Supine to sit: Max assist;+2 for physical assistance     General bed mobility comments: Hellicopter'ed  pt and used pad to scoot hips to EOB while tech assisted pt with trunk elevation.  Transfers Overall transfer level: Needs assistance   Transfers: Sit to/from Stand;Stand Pivot Transfers Sit to Stand: Total assist;+2 physical assistance Stand pivot transfers: Total assist;+2 physical assistance       General  transfer comment: Total assist with pad under hips to transfer pt to recliner. Multimodal cues for hand placement.  Ambulation/Gait             General Gait Details: Not attempted at this time as pt is currently limited by pain.   Stairs            Wheelchair Mobility    Modified Rankin (Stroke Patients Only)       Balance Overall balance assessment: Needs assistance Sitting-balance support: Feet supported;Bilateral upper extremity supported Sitting balance-Leahy Scale: Poor   Postural control: Left lateral lean;Posterior lean Standing balance support: Bilateral upper extremity supported Standing balance-Leahy Scale: Zero Standing balance comment: Total assist to maintain balance, heavy L lateral and posterior lean                            Cognition Arousal/Alertness: Awake/alert Behavior During Therapy: Restless Overall Cognitive Status: History of cognitive impairments - at baseline                                        Exercises      General Comments        Pertinent Vitals/Pain Pain Assessment: Faces Faces Pain Scale: Hurts whole lot Pain Location: presumed R hip Pain Descriptors / Indicators: Grimacing;Guarding;Moaning    Home Living                      Prior Function            PT Goals (current goals can now be found  in the care plan section) Acute Rehab PT Goals Patient Stated Goal: none stated PT Goal Formulation: With patient/family Time For Goal Achievement: 03/01/17 Potential to Achieve Goals: Fair    Frequency    Min 3X/week      PT Plan Current plan remains appropriate    Co-evaluation              AM-PAC PT "6 Clicks" Daily Activity  Outcome Measure  Difficulty turning over in bed (including adjusting bedclothes, sheets and blankets)?: Total Difficulty moving from lying on back to sitting on the side of the bed? : Total Difficulty sitting down on and standing up from a  chair with arms (e.g., wheelchair, bedside commode, etc,.)?: Total Help needed moving to and from a bed to chair (including a wheelchair)?: Total Help needed walking in hospital room?: Total Help needed climbing 3-5 steps with a railing? : Total 6 Click Score: 6    End of Session Equipment Utilized During Treatment: Gait belt;Right knee immobilizer Activity Tolerance: Patient limited by pain Patient left: with call bell/phone within reach;with family/visitor present;in chair;with chair alarm set Nurse Communication: Mobility status PT Visit Diagnosis: Unsteadiness on feet (R26.81);Other abnormalities of gait and mobility (R26.89);Pain Pain - Right/Left: Right Pain - part of body: Hip     Time: [id number] PT Time Calculation (min) (ACUTE ONLY): 19 min  Charges:  $Therapeutic Activity: 8-22 mins                    G Codes:       Kallie LocksHannah Roshunda Keir, [place] Pager [phone] Acute Rehab   Sheral ApleyHannah E Nkosi Cortright 02/17/2017, 1:37 PM

## 2017-02-17 NOTE — Progress Notes (Addendum)
Patient ID: Cynthia Todd, female   DOB: 02-Feb-1919, 83 y.o.   MRN: [id number]                                                                PROGRESS NOTE                                                                                                                                                                                                             Patient Demographics:    Cynthia Todd, is a 83 y.o. female, DOB - [birth year], [id number]  Admit date - 02/13/2017   Admitting Physician Jones Broom, MD  Outpatient Primary MD for the patient is Fleet Contras, MD  LOS - 4  Outpatient Specialists:     Chief Complaint  Patient presents with  . Fall  . Knee Pain       Brief Narrative  83 y.o.femalewith a past medical history of chronic diastolic CHF, diabetes, who had a mechanical fall earlier today resulting in pain in the right hip as well as knee. Patient does not speak any English, so not much history was available. She was noted to be grimacing and so was thought to be in pain. No chest pain or shortness of breath. History is very limited.   Subjective:    Cynthia Todd today has been feeling ok,  Has poor po intake.  Family doesn't seem to want rehab due to language barrier.  No headache, No chest pain, No abdominal pain - No Nausea, No new weakness tingling or numbness, No Cough - SOB.    Assessment  & Plan :    Active Problems:   Diabetes mellitus (HCC)   Diastolic heart failure, NYHA class 1 (HCC)   Pulmonary HTN (HCC)   CKD (chronic kidney disease) stage 3, GFR 30-59 ml/min   Closed right hip fracture (HCC)   Cardiomegaly   Closed fracture of neck of right femur (HCC)   Abnormal liver function   S/P hip hemiarthroplasty   ARF (acute renal failure) (HCC)    ARF on CRF Renal ultrasound=> medical renal disease Increase ns rate to per hour, pt appears dehydrated, If creatinine not improving may need nephrology consultation tomorrow.  Encourage po  intake  Hyponatremia  Check cortisol, tsh, serum osm, urine osm Hydrate  with ns iv  Right hip fracture: S/p R THA POD #3 Orthopedics has been consulted. Dr. Ave Filter wanted her to go to surgery 6/15 . Appreciateorthopedic input PT to evaluate  Cardiomegaly, CHF (diastolic), MR, Mild-Moderate AR(cardiac echo 02/14/2017) Stable  Chronic kidney disease stage 3 (baseline 1.3), Mild Acute on CRF Hold diuretic for now, Hold Losartan for now check cmp in am  Diabetes mellitus type II: She is on Lantus 1/2 normal dose Sliding scale insulin coverage.  Hypertension uncontrolled,  Cont Bidil tid Hydralazine iv prn   Abnormal liver function RUQ ultrasound 6/16=> numerous hepatic granuloma  Thrombocytopenia Check cbc in am  Anemia Likely post op Repeat cbc in am  DVT Prophylaxis:SCDs for now Code Status:Full code Family Communication:granddaughter Consults called:Orthopedics Admission status:Inpatient Dispo:  Granddaughter seems to be in favor or home with PT rather than SNF due to language barrier   Lab Results  Component Value Date   PLT 188 02/17/2017    Antibiotics  :  ancep 6/16  Anti-infectives    Start     Dose/Rate Route Frequency Ordered Stop   02/14/17 1830  ceFAZolin (ANCEF) IVPB 2g/100 mL premix     2 g 200 mL/hr over 30 Minutes Intravenous Every 6 hours 02/14/17 1822 02/15/17 0629   02/14/17 1500  ceFAZolin (ANCEF) IVPB 2g/100 mL premix     2 g 200 mL/hr over 30 Minutes Intravenous On call to O.R. 02/14/17 1437 02/14/17 1606        Objective:   Vitals:   02/16/17 0749 02/16/17 1434 02/16/17 2114 02/17/17 0407  BP: (!) 153/59 (!) 147/66 122/77 139/66  Pulse: 61 70 64 68  Resp: 16 18 18 20   Temp: 98.8 F (37.1 C) 97.1 F (36.2 C) 99.1 F (37.3 C) 98.1 F (36.7 C)  TempSrc: Axillary Oral Oral Oral  SpO2: 98% 98% 97% 100%  Weight:      Height:        Wt Readings from Last 3 Encounters:  02/15/17 54 kg (119 lb 0.8 oz)   06/20/16 54.1 kg (119 lb 4.3 oz)  11/19/14 60.6 kg (133 lb 9.6 oz)     Intake/Output Summary (Last 24 hours) at 02/17/17 0722 Last data filed at 02/17/17 0640  Gross per 24 hour  Intake            997.5 ml  Output              200 ml  Net            797.5 ml     Physical Exam  Awake Alert, Oriented X 3, No new F.N deficits, Normal affect [place].AT,PERRAL Supple Neck,No JVD, No cervical lymphadenopathy appriciated.  Symmetrical Chest wall movement, Good air movement bilaterally, CTAB RRR,2/6 sem rusb +ve B.Sounds, Abd Soft, No tenderness, No organomegaly appriciated, No rebound - guarding or rigidity. No Cyanosis, Clubbing or edema, No new Rash or bruise      Data Review:    CBC  Recent Labs Lab 02/13/17 1423 02/14/17 0601 02/15/17 0440 02/16/17 0507 02/17/17 0331  WBC 7.4 5.1 7.6 8.7 8.3  HGB 12.5 11.5* 9.7* 9.8* 9.4*  HCT 38.6 35.2* 31.0* 30.2* 29.4*  PLT 129* 115* 149* 145* 188  MCV 97.5 99.2 98.7 97.4 95.8  MCH 31.6 32.4 30.9 31.6 30.6  MCHC 32.4 32.7 31.3 32.5 32.0  RDW 15.8* 15.8* 15.8* 15.8* 15.3  LYMPHSABS 0.4*  --   --   --   --   MONOABS 0.3  --   --   --   --  EOSABS 0.0  --   --   --   --   BASOSABS 0.0  --   --   --   --     Chemistries   Recent Labs Lab 02/13/17 1423 02/14/17 0601 02/15/17 0440 02/16/17 0507 02/17/17 0331  NA 136 136 136 132* 129*  K 4.2 4.3 4.7 5.0 4.9  CL 104 105 105 103 100*  CO2 23 22 23  20* 23  GLUCOSE 178* 126* 133* 130* 148*  BUN 32* 31* 37* 52* 59*  CREATININE 2.13* 2.09* 2.49* 2.85* 2.89*  CALCIUM 9.0 8.6* 8.4* 8.3* 7.9*  AST 44*  --  40  --  35  ALT 28  --  18  --  6*  ALKPHOS 156*  --  109  --  135*  BILITOT 1.8*  --  1.3*  --  1.1   ------------------------------------------------------------------------------------------------------------------ No results for input(s): CHOL, HDL, LDLCALC, TRIG, CHOLHDL, LDLDIRECT in the last 72 hours.  Lab Results  Component Value Date   HGBA1C 5.7 (H) 06/19/2016    ------------------------------------------------------------------------------------------------------------------ No results for input(s): TSH, T4TOTAL, T3FREE, THYROIDAB in the last 72 hours.  Invalid input(s): FREET3 ------------------------------------------------------------------------------------------------------------------ No results for input(s): VITAMINB12, FOLATE, FERRITIN, TIBC, IRON, RETICCTPCT in the last 72 hours.  Coagulation profile No results for input(s): INR, PROTIME in the last 168 hours.  No results for input(s): DDIMER in the last 72 hours.  Cardiac Enzymes No results for input(s): CKMB, TROPONINI, MYOGLOBIN in the last 168 hours.  Invalid input(s): CK ------------------------------------------------------------------------------------------------------------------    Component Value Date/Time   BNP 2,528.5 (H) 02/14/2017 1230    Inpatient Medications  Scheduled Meds: . aspirin EC  325 mg Oral Q breakfast  . feeding supplement (GLUCERNA SHAKE)  237 mL Oral BID BM  . insulin aspart  0-15 Units Subcutaneous TID WC  . insulin glargine  5 Units Subcutaneous Daily  . isosorbide-hydrALAZINE  1 tablet Oral TID  . levothyroxine  50 mcg Oral QAC breakfast  . metoprolol succinate  50 mg Oral Daily  . multivitamin with minerals  1 tablet Oral Daily  . pantoprazole  40 mg Oral Daily  . senna  1 tablet Oral BID   Continuous Infusions: . lactated ringers Stopped (02/16/17 0845)   PRN Meds:.acetaminophen **OR** acetaminophen, albuterol, hydrALAZINE, HYDROcodone-acetaminophen, menthol-cetylpyridinium **OR** phenol, metoCLOPramide **OR** metoCLOPramide (REGLAN) injection, morphine injection, ondansetron **OR** ondansetron (ZOFRAN) IV  Micro Results Recent Results (from the past 240 hour(s))  Surgical pcr screen     Status: None   Collection Time: 02/14/17  2:09 AM  Result Value Ref Range Status   MRSA, PCR NEGATIVE NEGATIVE Final   Staphylococcus aureus  NEGATIVE NEGATIVE Final    Comment:        The Xpert SA Assay (FDA approved for NASAL specimens in patients over 81 years of age), is one component of a comprehensive surveillance program.  Test performance has been validated by [facility] for patients greater than or equal to 305 year old. It is not intended to diagnose infection nor to guide or monitor treatment.     Radiology Reports Dg Chest 1 View  Result Date: 02/13/2017 CLINICAL DATA:  Fall. EXAM: CHEST 1 VIEW COMPARISON:  06/19/2016 FINDINGS: New cardiopericardial enlargement with possible pericardial effusion morphology. New pleural effusion on the right that is small volume. Vascular congestion. No air bronchogram or pneumothorax. Osteopenia and diffuse spondylosis. No acute osseous finding. IMPRESSION: 1. Cardiopericardial enlargement that is new from 2017. There could be a pericardial effusion based on  the shape. 2. Small volume right pleural fluid. 3. Pulmonary vascular congestion. Electronically Signed   By: Marnee Spring M.D.   On: 02/13/2017 15:04   US Renal  Result Date: 02/16/2017 CLINICAL DATA:  Acute renal failure, history diabetes mellitus, hypertension, stage III chronic kidney disease EXAM: RENAL / URINARY TRACT ULTRASOUND COMPLETE COMPARISON:  CT abdomen pelvis 04/14/2006 FINDINGS: Right Kidney: Length: 7.8 cm. Appear small and atrophic. Slightly increased cortical echogenicity. Cortical thinning. No gross mass or hydronephrosis. Left Kidney: Length: 8.5 cm. Cortical thinning with slightly increased cortical echogenicity. Septated cyst at upper pole LEFT kidney 2.9 x 3.7 x 3.2 cm, measured 4.1 cm greatest size in 2007. No hydronephrosis or definite solid mass. Bladder: Decompressed, unable to evaluate IMPRESSION: Atrophic appearing kidneys with medical renal disease changes. Septated cyst at upper pole LEFT kidney 3.7 cm greatest size. Electronically Signed   By: Ulyses Southward M.D.   On: 02/16/2017 19:49   Pelvis  Portable  Result Date: 02/14/2017 CLINICAL DATA:  Right hip replacement. EXAM: PORTABLE PELVIS 1-2 VIEWS COMPARISON:  None. FINDINGS: The patient is status post right hip replacement. Hardware is in good position. Skin staples and postoperative air are identified. Atherosclerosis. IMPRESSION: Right hip replacement as above. Electronically Signed   By: Gerome Sam III M.D   On: 02/14/2017 19:43   Dg Knee Complete 4 Views Right  Result Date: 02/13/2017 CLINICAL DATA:  Right knee pain after fall today. EXAM: RIGHT KNEE - COMPLETE 4+ VIEW COMPARISON:  None. FINDINGS: No evidence of fracture, dislocation, or joint effusion. Severe narrowing of the medial and lateral joint spaces is noted. Spurring of the patella is noted. Vascular calcifications are noted. IMPRESSION: Severe degenerative joint disease. No acute abnormality seen in the right knee. Electronically Signed   By: Lupita Raider, M.D.   On: 02/13/2017 15:05   Dg Hip Unilat W Or Wo Pelvis 2-3 Views Right  Result Date: 02/13/2017 CLINICAL DATA:  Fall.  Right hip and knee pain.  Initial encounter. EXAM: DG HIP (WITH OR WITHOUT PELVIS) 2-3V RIGHT COMPARISON:  None. FINDINGS: The right femur appears foreshortened and is in external rotation with appearance compatible with a femoral neck fracture. Detailed assessment is limited by external rotation. The right femoral head remains seated in the acetabulum. Vascular calcifications are noted. IMPRESSION: Right femoral neck fracture. Electronically Signed   By: Sebastian Ache M.D.   On: 02/13/2017 15:10   US Abdomen Limited Ruq  Result Date: 02/14/2017 CLINICAL DATA:  Abnormal liver functions tests. History diabetes, chronic kidney disease, and hypertension. EXAM: ULTRASOUND ABDOMEN LIMITED RIGHT UPPER QUADRANT COMPARISON:  CT of the abdomen and pelvis on 04/14/2016 FINDINGS: Gallbladder: Gallbladder has a normal appearance. Gallbladder wall is 2.3 mm, within normal limits. No stones or pericholecystic  fluid. No sonographic Murphy's sign. Common bile duct: Diameter: 1.8 mm Liver: Hepatic echotexture is normal in appearance. Numerous hyperechoic foci are identified within the liver, consistent with hepatic granulomata. No suspicious liver lesions are identified. Additional: Right pleural effusion is present. Note is made of echogenic right kidney. IMPRESSION: 1. No evidence for acute cholecystitis. 2. Numerous hepatic granulomata. 3. Echogenic right kidney, consistent with history of chronic kidney disease. Electronically Signed   By: Norva Pavlov M.D.   On: 02/14/2017 16:30    Time Spent in minutes  30   Pearson Grippe M.D on 02/17/2017 at 7:22 AM  Between 7am to 7pm - Pager - [phone]  After 7pm go to [email or web address] - password [email or web address]  [facility] -  Office  [phone]

## 2017-02-17 NOTE — Clinical Social Work Note (Signed)
Clinical Social Work Assessment  Patient Details  Name: Cynthia Todd MRN: [id number] Date of Birth: 1919/02/10  Date of referral:  02/17/17               Reason for consult:  Facility Placement                Permission sought to share information with:    Permission granted to share information::  Yes, Release of Information Signed  Name::        Agency::  SNF  Relationship::     Contact Information:     Housing/Transportation Living arrangements for the past 2 months:  Single Family Home Source of Information:  Adult Children Patient Interpreter Needed:  Other (Comment Required) Merrilee Seashore) Criminal Activity/Legal Involvement Pertinent to Current Situation/Hospitalization:  No - Comment as needed Significant Relationships:  Adult Children, Other Family Members Lives with:  Adult Children, Relatives Do you feel safe going back to the place where you live?  No Need for family participation in patient care:  Yes (Comment)  Care giving concerns:  Patient resided with family prior to fall at home. Patient was independent with feeding, dressing, and mobility. Family did assist with bathing. Patient at this time, not safe to return home.  Social Worker assessment / plan:  CSW met with family to discuss clinical's team recommendation for SNF placement at [place]. Family agreeable with plan of care. CSW explained her role and SNF placement/options.  CSW obtained permission to send out offers to local facilities. CSW discussed medicaid coverage and the limitation with the bed offers.   Passr obtained. Completed FL2 and sent out offers.  Employment status:  Retired Forensic scientist:  Medicaid In [place] PT Recommendations:  [place] / Referral to community resources:  [place]  Patient/Family's Response to care:  Family appreciative of CSW assistance with SNF placement. No issues or concerns identified at this time.  Patient/Family's Understanding  of and Emotional Response to Diagnosis, Current Treatment, and Prognosis:  Patient has good understanding of diagnosis, current treatment and prognosis. They are hopeful that patient will return to baseline and improve with rehabilitation. No issues or concerns identified at this time.  Emotional Assessment Appearance:  Appears stated age Attitude/Demeanor/Rapport:   (Cooperative) Affect (typically observed):  Accepting, Appropriate Orientation:  Oriented to Self, Oriented to Place, Oriented to  Time, Oriented to Situation Alcohol / Substance use:  Not Applicable Psych involvement (Current and /or in the community):  No (Comment)  Discharge Needs  Concerns to be addressed:  Care Coordination Readmission within the last 30 days:  No Current discharge risk:  Physical Impairment, Dependent with Mobility Barriers to Discharge:  No Barriers Identified   Normajean Baxter, LCSW 02/17/2017, 4:13 PM

## 2017-02-18 DIAGNOSIS — S72001D Fracture of unspecified part of neck of right femur, subsequent encounter for closed fracture with routine healing: Secondary | ICD-10-CM

## 2017-02-18 DIAGNOSIS — R338 Other retention of urine: Secondary | ICD-10-CM

## 2017-02-18 LAB — COMPREHENSIVE METABOLIC PANEL
ALT: 6 U/L — ABNORMAL LOW (ref 14–54)
AST: 45 U/L — ABNORMAL HIGH (ref 15–41)
Albumin: 2.3 g/dL — ABNORMAL LOW (ref 3.5–5.0)
Alkaline Phosphatase: 193 U/L — ABNORMAL HIGH (ref 38–126)
Anion gap: 8 (ref 5–15)
BILIRUBIN TOTAL: 1.3 mg/dL — AB (ref 0.3–1.2)
BUN: 66 mg/dL — ABNORMAL HIGH (ref 6–20)
CALCIUM: 8.1 mg/dL — AB (ref 8.9–10.3)
CO2: 18 mmol/L — ABNORMAL LOW (ref 22–32)
CREATININE: 3.07 mg/dL — AB (ref 0.44–1.00)
Chloride: 100 mmol/L — ABNORMAL LOW (ref 101–111)
GFR calc non Af Amer: 12 mL/min — ABNORMAL LOW (ref >60)
GFR, EST AFRICAN AMERICAN: 14 mL/min — AB (ref >60)
GLUCOSE: 147 mg/dL — AB (ref 65–99)
Potassium: 5.4 mmol/L — ABNORMAL HIGH (ref 3.5–5.1)
Sodium: 126 mmol/L — ABNORMAL LOW (ref 135–145)
TOTAL PROTEIN: 6.9 g/dL (ref 6.5–8.1)

## 2017-02-18 LAB — CBC
HEMATOCRIT: 32.1 % — AB (ref 36.0–46.0)
HEMOGLOBIN: 10.5 g/dL — AB (ref 12.0–15.0)
MCH: 31.2 pg (ref 26.0–34.0)
MCHC: 32.7 g/dL (ref 30.0–36.0)
MCV: 95.3 fL (ref 78.0–100.0)
Platelets: 206 10*3/uL (ref 150–400)
RBC: 3.37 MIL/uL — ABNORMAL LOW (ref 3.87–5.11)
RDW: 15.5 % (ref 11.5–15.5)
WBC: 6.6 10*3/uL (ref 4.0–10.5)

## 2017-02-18 LAB — LIPID PANEL
Cholesterol: 95 (ref 0–200)
HDL: 37 (ref 35–70)
LDL Cholesterol: 46
Triglycerides: 60 (ref 40–160)

## 2017-02-18 LAB — CBC AND DIFFERENTIAL
HEMATOCRIT: 29 — AB (ref 36–46)
Hemoglobin: 9.6 — AB (ref 12.0–16.0)
Neutrophils Absolute: 4
Platelets: 339 (ref 150–399)
WBC: 5.1

## 2017-02-18 LAB — BASIC METABOLIC PANEL
BUN: 77 — AB (ref 4–21)
CREATININE: 2.6 — AB (ref 0.5–1.1)
Glucose: 71
POTASSIUM: 6.3 — AB (ref 3.4–5.3)
SODIUM: 128 — AB (ref 137–147)

## 2017-02-18 LAB — GLUCOSE, CAPILLARY
GLUCOSE-CAPILLARY: 127 mg/dL — AB (ref 65–99)
Glucose-Capillary: 136 mg/dL — ABNORMAL HIGH (ref 65–99)
Glucose-Capillary: 146 mg/dL — ABNORMAL HIGH (ref 65–99)
Glucose-Capillary: 157 mg/dL — ABNORMAL HIGH (ref 65–99)

## 2017-02-18 LAB — HEPATIC FUNCTION PANEL
ALT: 11 (ref 7–35)
AST: 35 (ref 13–35)
Alkaline Phosphatase: 221 — AB (ref 25–125)
BILIRUBIN, TOTAL: 1.2

## 2017-02-18 LAB — HEMOGLOBIN A1C: Hemoglobin A1C: 6.3

## 2017-02-18 MED ORDER — SODIUM POLYSTYRENE SULFONATE 15 GM/60ML PO SUSP
30.0000 g | Freq: Once | ORAL | Status: AC
  Administered 2017-02-18: 30 g via ORAL
  Filled 2017-02-18: qty 120

## 2017-02-18 MED ORDER — LEVOTHYROXINE SODIUM 75 MCG PO TABS
75.0000 ug | ORAL_TABLET | Freq: Every day | ORAL | Status: DC
  Administered 2017-02-18 – 2017-02-19 (×2): 75 ug via ORAL
  Filled 2017-02-18 (×2): qty 1

## 2017-02-18 MED ORDER — SODIUM CHLORIDE 0.9 % IV SOLN: INTRAVENOUS | Status: DC

## 2017-02-18 MED ORDER — FUROSEMIDE 10 MG/ML IJ SOLN
20.0000 mg | Freq: Once | INTRAMUSCULAR | Status: AC
  Administered 2017-02-18: 20 mg via INTRAVENOUS
  Filled 2017-02-18: qty 2

## 2017-02-18 NOTE — Progress Notes (Signed)
   PATIENT ID: Cynthia Todd   4 Days Post-Op Procedure(s) (LRB): RIGHT HIP HEMIARTHROPLASTY (Right)  Subjective: Daughter reports her mother is doing well. She tells me she wishes to her mother to be released home with home health/PT instead of a SNF due to the language barrier. She tells me she will be able to be home with her mother all day, to provide assistance and translation, whereas she cannot be at the SNF and worries that he mother will refuse meds/PT etc. She tells me they live in a 1 floor house with no stairs.   Objective:  Vitals:   02/17/17 2058 02/18/17 0639  BP: 118/85 (!) 143/52  Pulse: 61 65  Resp: 18 18  Temp: 98.9 F (37.2 C) 98.5 F (36.9 C)     R hip dressing c/d/i Wiggles toes, distally NVI  Labs:   Recent Labs  02/16/17 0507 02/17/17 0331  HGB 9.8* 9.4*   Recent Labs  02/16/17 0507 02/17/17 0331  WBC 8.7 8.3  RBC 3.10* 3.07*  HCT 30.2* 29.4*  PLT 145* 188   Recent Labs  02/16/17 0507 02/17/17 0331  NA 132* 129*  K 5.0 4.9  CL 103 100*  CO2 20* 23  BUN 52* 59*  CREATININE 2.85* 2.89*  GLUCOSE 130* 148*  CALCIUM 8.3* 7.9*    Assessment and Plan: 3 days s/p R hip hemi for fracture limit narcotics, transition on norco if tolerated Medicine following and we appreciate their care We agree that d/c to home with homehealth/PT is reasonable since she has 24 hr care from family, could SW please consider and address their concerns about SNF? We appreciate SW help in this case  from ortho standpoint okay to dc WBAT  posteriop hip precautions  VTE proph: ASA, SCDs

## 2017-02-18 NOTE — Progress Notes (Signed)
Patient ID: [name], female   DOB: 23-Nov-1918, 83 y.o.   MRN: [id number]                                                                PROGRESS NOTE                                                                                                                                                                                                             Patient Demographics:    Cynthia Todd, is a 83 y.o. female, DOB - [birth year], WUJ:[id number]  Admit date - 02/13/2017   Admitting Physician Jones Broom, MD  Outpatient Primary MD for the patient is Fleet Contras, MD  LOS - 5  Outpatient Specialists:     Chief Complaint  Patient presents with  . Fall  . Knee Pain       Brief Narrative  83 y.o.femalewith a past medical history of chronic diastolic CHF, diabetes, who had a mechanical fall earlier today resulting in pain in the right hip as well as knee. Patient does not speak any English, so not much history was available. She was noted to be grimacing and so was thought to be in pain. No chest pain or shortness of breath. History is very limited.   Subjective:    Cynthia Todd still with poor po intake and still dependent on foley cath   Assessment  & Plan :    Active Problems:   Diabetes mellitus (HCC)   Diastolic heart failure, NYHA class 1 (HCC)   Pulmonary HTN (HCC)   CKD (chronic kidney disease) stage 3, GFR 30-59 ml/min   Closed right hip fracture (HCC)   Cardiomegaly   Closed fracture of neck of right femur (HCC)   Abnormal liver function   S/P hip hemiarthroplasty   ARF (acute renal failure) (HCC)   Acute urinary retention  ARF on CRF Renal ultrasound=> medical renal disease If creatinine not improving may need nephrology consultation tomorrow.  Encourage po intake  Hyponatremia  normal cortisol, TSH Suspect some SIADh  Right hip fracture: S/p R THA POD #3 Orthopedics has been consulted. Dr. Ave Filter took to OR 6/15 . Appreciateorthopedic input PT  rec SNF but family want to take home  Cardiomegaly, CHF (diastolic), MR, Mild-Moderate AR(cardiac echo 02/14/2017) Stable  Acute urinary retention - failed voiding trial, will keep foley in place for now  Hyperkalemia  - give lasix x 1, oral kayexalate, recheck in AM, monitor tele  Chronic kidney disease stage 3 (baseline 1.3), Mild Acute on CRF Hold diuretic for now, Hold Losartan for now Follow  Diabetes mellitus type II: She is on Lantus 1/2 normal dose Sliding scale insulin coverage with good results.  CBG (last 3)   Recent Labs  02/17/17 2039 02/18/17 0635 02/18/17 1245  GLUCAP 126* 127* 136*   Hypertension uncontrolled,  Cont Bidil tid Hydralazine iv prn   Abnormal liver function RUQ ultrasound 6/16=> numerous hepatic granuloma  Thrombocytopenia resolved  Anemia of CKD - Stable   DVT Prophylaxis:SCDs for now Code Status:Full code Family Communication:granddaughter Consults called:Orthopedics Admission status:Inpatient  Lab Results  Component Value Date   PLT 206 02/18/2017    Antibiotics  :  ancep 6/16  Anti-infectives    Start     Dose/Rate Route Frequency Ordered Stop   02/14/17 1830  ceFAZolin (ANCEF) IVPB 2g/100 mL premix     2 g 200 mL/hr over 30 Minutes Intravenous Every 6 hours 02/14/17 1822 02/15/17 0629   02/14/17 1500  ceFAZolin (ANCEF) IVPB 2g/100 mL premix     2 g 200 mL/hr over 30 Minutes Intravenous On call to O.R. 02/14/17 1437 02/14/17 1606        Objective:   Vitals:   02/17/17 0407 02/17/17 1334 02/17/17 2058 02/18/17 0639  BP: 139/66 (!) 157/60 118/85 (!) 143/52  Pulse: 68 70 61 65  Resp: 20  18 18   Temp: 98.1 F (36.7 C) 98.1 F (36.7 C) 98.9 F (37.2 C) 98.5 F (36.9 C)  TempSrc: Oral Oral Axillary Oral  SpO2: 100% 100% 100% 98%  Weight:      Height:        Wt Readings from Last 3 Encounters:  02/15/17 54 kg (119 lb 0.8 oz)  06/20/16 54.1 kg (119 lb 4.3 oz)  11/19/14 60.6 kg (133 lb 9.6 oz)      Intake/Output Summary (Last 24 hours) at 02/18/17 1458 Last data filed at 02/18/17 1300  Gross per 24 hour  Intake              150 ml  Output              252 ml  Net             -102 ml     Physical Exam  Awake Alert, Oriented X 3, No new F.N deficits, Normal affect Cynthia Todd,PERRAL Supple Neck,No JVD, No cervical lymphadenopathy appriciated.  Symmetrical Chest wall movement, Good air movement bilaterally, CTAB RRR,2/6 sem rusb +ve B.Sounds, Abd Soft, No tenderness, No organomegaly appriciated, No rebound - guarding or rigidity. No Cyanosis, Clubbing or edema, No new Rash or bruise      Data Review:    CBC  Recent Labs Lab 02/13/17 1423 02/14/17 0601 02/15/17 0440 02/16/17 0507 02/17/17 0331 02/18/17 0755  WBC 7.4 5.1 7.6 8.7 8.3 6.6  HGB 12.5 11.5* 9.7* 9.8* 9.4* 10.5*  HCT 38.6 35.2* 31.0* 30.2* 29.4* 32.1*  PLT 129* 115* 149* 145* 188 206  MCV 97.5 99.2 98.7 97.4 95.8 95.3  MCH 31.6 32.4 30.9 31.6 30.6 31.2  MCHC 32.4 32.7 31.3 32.5 32.0 32.7  RDW 15.8* 15.8* 15.8* 15.8* 15.3 15.5  LYMPHSABS 0.4*  --   --   --   --   --   MONOABS 0.3  --   --   --   --   --  EOSABS 0.0  --   --   --   --   --   BASOSABS 0.0  --   --   --   --   --     Chemistries   Recent Labs Lab 02/13/17 1423 02/14/17 0601 02/15/17 0440 02/16/17 0507 02/17/17 0331 02/18/17 0755  NA 136 136 136 132* 129* 126*  K 4.2 4.3 4.7 5.0 4.9 5.4*  CL 104 105 105 103 100* 100*  CO2 23 22 23  20* 23 18*  GLUCOSE 178* 126* 133* 130* 148* 147*  BUN 32* 31* 37* 52* 59* 66*  CREATININE 2.13* 2.09* 2.49* 2.85* 2.89* 3.07*  CALCIUM 9.0 8.6* 8.4* 8.3* 7.9* 8.1*  AST 44*  --  40  --  35 45*  ALT 28  --  18  --  6* 6*  ALKPHOS 156*  --  109  --  135* 193*  BILITOT 1.8*  --  1.3*  --  1.1 1.3*   ------------------------------------------------------------------------------------------------------------------ No results for input(s): CHOL, HDL, LDLCALC, TRIG, CHOLHDL, LDLDIRECT in the last 72  hours.  Lab Results  Component Value Date   HGBA1C 5.7 (H) 06/19/2016   ------------------------------------------------------------------------------------------------------------------  Recent Labs  02/17/17 0812  TSH 9.423*   ------------------------------------------------------------------------------------------------------------------ No results for input(s): VITAMINB12, FOLATE, FERRITIN, TIBC, IRON, RETICCTPCT in the last 72 hours.  Coagulation profile No results for input(s): INR, PROTIME in the last 168 hours.  No results for input(s): DDIMER in the last 72 hours.  Cardiac Enzymes No results for input(s): CKMB, TROPONINI, MYOGLOBIN in the last 168 hours.  Invalid input(s): CK ------------------------------------------------------------------------------------------------------------------    Component Value Date/Time   BNP 2,528.5 (H) 02/14/2017 1230    Inpatient Medications  Scheduled Meds: . aspirin EC  325 mg Oral Q breakfast  . feeding supplement (GLUCERNA SHAKE)  237 mL Oral BID BM  . insulin aspart  0-15 Units Subcutaneous TID WC  . insulin glargine  5 Units Subcutaneous Daily  . isosorbide-hydrALAZINE  1 tablet Oral TID  . levothyroxine  75 mcg Oral QAC breakfast  . metoprolol succinate  50 mg Oral Daily  . multivitamin with minerals  1 tablet Oral Daily  . pantoprazole  40 mg Oral Daily  . senna  1 tablet Oral BID   Continuous Infusions: . lactated ringers Stopped (02/16/17 0845)   PRN Meds:.acetaminophen **OR** acetaminophen, albuterol, hydrALAZINE, HYDROcodone-acetaminophen, menthol-cetylpyridinium **OR** phenol, metoCLOPramide **OR** metoCLOPramide (REGLAN) injection, morphine injection, ondansetron **OR** ondansetron (ZOFRAN) IV  Micro Results Recent Results (from the past 240 hour(s))  Surgical pcr screen     Status: None   Collection Time: 02/14/17  2:09 AM  Result Value Ref Range Status   MRSA, PCR NEGATIVE NEGATIVE Final    Staphylococcus aureus NEGATIVE NEGATIVE Final    Comment:        The Xpert SA Assay (FDA approved for NASAL specimens in patients over 59 years of age), is one component of a comprehensive surveillance program.  Test performance has been validated by [facility] for patients greater than or equal to 2 year old. It is not intended to diagnose infection nor to guide or monitor treatment.     Radiology Reports Dg Chest 1 View  Result Date: 02/13/2017 CLINICAL DATA:  Fall. EXAM: CHEST 1 VIEW COMPARISON:  06/19/2016 FINDINGS: New cardiopericardial enlargement with possible pericardial effusion morphology. New pleural effusion on the right that is small volume. Vascular congestion. No air bronchogram or pneumothorax. Osteopenia and diffuse spondylosis. No acute osseous finding. IMPRESSION: 1. Cardiopericardial  enlargement that is new from 2017. There could be a pericardial effusion based on the shape. 2. Small volume right pleural fluid. 3. Pulmonary vascular congestion. Electronically Signed   By: Marnee Spring M.D.   On: 02/13/2017 15:04   US Renal  Result Date: 02/16/2017 CLINICAL DATA:  Acute renal failure, history diabetes mellitus, hypertension, stage III chronic kidney disease EXAM: RENAL / URINARY TRACT ULTRASOUND COMPLETE COMPARISON:  CT abdomen pelvis 04/14/2006 FINDINGS: Right Kidney: Length: 7.8 cm. Appear small and atrophic. Slightly increased cortical echogenicity. Cortical thinning. No gross mass or hydronephrosis. Left Kidney: Length: 8.5 cm. Cortical thinning with slightly increased cortical echogenicity. Septated cyst at upper pole LEFT kidney 2.9 x 3.7 x 3.2 cm, measured 4.1 cm greatest size in 2007. No hydronephrosis or definite solid mass. Bladder: Decompressed, unable to evaluate IMPRESSION: Atrophic appearing kidneys with medical renal disease changes. Septated cyst at upper pole LEFT kidney 3.7 cm greatest size. Electronically Signed   By: Ulyses Southward M.D.   On: 02/16/2017  19:49   Pelvis Portable  Result Date: 02/14/2017 CLINICAL DATA:  Right hip replacement. EXAM: PORTABLE PELVIS 1-2 VIEWS COMPARISON:  None. FINDINGS: The patient is status post right hip replacement. Hardware is in good position. Skin staples and postoperative air are identified. Atherosclerosis. IMPRESSION: Right hip replacement as above. Electronically Signed   By: Gerome Sam III M.D   On: 02/14/2017 19:43   Dg Knee Complete 4 Views Right  Result Date: 02/13/2017 CLINICAL DATA:  Right knee pain after fall today. EXAM: RIGHT KNEE - COMPLETE 4+ VIEW COMPARISON:  None. FINDINGS: No evidence of fracture, dislocation, or joint effusion. Severe narrowing of the medial and lateral joint spaces is noted. Spurring of the patella is noted. Vascular calcifications are noted. IMPRESSION: Severe degenerative joint disease. No acute abnormality seen in the right knee. Electronically Signed   By: Lupita Raider, M.D.   On: 02/13/2017 15:05   Dg Hip Unilat W Or Wo Pelvis 2-3 Views Right  Result Date: 02/13/2017 CLINICAL DATA:  Fall.  Right hip and knee pain.  Initial encounter. EXAM: DG HIP (WITH OR WITHOUT PELVIS) 2-3V RIGHT COMPARISON:  None. FINDINGS: The right femur appears foreshortened and is in external rotation with appearance compatible with a femoral neck fracture. Detailed assessment is limited by external rotation. The right femoral head remains seated in the acetabulum. Vascular calcifications are noted. IMPRESSION: Right femoral neck fracture. Electronically Signed   By: Sebastian Ache M.D.   On: 02/13/2017 15:10   US Abdomen Limited Ruq  Result Date: 02/14/2017 CLINICAL DATA:  Abnormal liver functions tests. History diabetes, chronic kidney disease, and hypertension. EXAM: ULTRASOUND ABDOMEN LIMITED RIGHT UPPER QUADRANT COMPARISON:  CT of the abdomen and pelvis on 04/14/2016 FINDINGS: Gallbladder: Gallbladder has a normal appearance. Gallbladder wall is 2.3 mm, within normal limits. No stones or  pericholecystic fluid. No sonographic Murphy's sign. Common bile duct: Diameter: 1.8 mm Liver: Hepatic echotexture is normal in appearance. Numerous hyperechoic foci are identified within the liver, consistent with hepatic granulomata. No suspicious liver lesions are identified. Additional: Right pleural effusion is present. Note is made of echogenic right kidney. IMPRESSION: 1. No evidence for acute cholecystitis. 2. Numerous hepatic granulomata. 3. Echogenic right kidney, consistent with history of chronic kidney disease. Electronically Signed   By: Norva Pavlov M.D.   On: 02/14/2017 16:30    Time Spent in minutes  30  Clanford Johnson M.D on 02/18/2017 at 2:58 PM  Between 7am to 7pm  After 7pm  go to [email or web address] - password [email or web address]  [facility] -  Office  [phone]

## 2017-02-18 NOTE — Progress Notes (Signed)
Anemia of chronic kidney disease

## 2017-02-19 DIAGNOSIS — I272 Pulmonary hypertension, unspecified: Secondary | ICD-10-CM

## 2017-02-19 LAB — CBC
HCT: 27.1 % — ABNORMAL LOW (ref 36.0–46.0)
Hemoglobin: 8.7 g/dL — ABNORMAL LOW (ref 12.0–15.0)
MCH: 30.5 pg (ref 26.0–34.0)
MCHC: 32.1 g/dL (ref 30.0–36.0)
MCV: 95.1 fL (ref 78.0–100.0)
Platelets: 204 10*3/uL (ref 150–400)
RBC: 2.85 MIL/uL — ABNORMAL LOW (ref 3.87–5.11)
RDW: 15.3 % (ref 11.5–15.5)
WBC: 5.9 10*3/uL (ref 4.0–10.5)

## 2017-02-19 LAB — BASIC METABOLIC PANEL
ANION GAP: 8 (ref 5–15)
BUN: 71 mg/dL — ABNORMAL HIGH (ref 6–20)
CO2: 20 mmol/L — AB (ref 22–32)
Calcium: 7.7 mg/dL — ABNORMAL LOW (ref 8.9–10.3)
Chloride: 100 mmol/L — ABNORMAL LOW (ref 101–111)
Creatinine, Ser: 3.11 mg/dL — ABNORMAL HIGH (ref 0.44–1.00)
GFR calc non Af Amer: 12 mL/min — ABNORMAL LOW (ref >60)
GFR, EST AFRICAN AMERICAN: 14 mL/min — AB (ref >60)
GLUCOSE: 109 mg/dL — AB (ref 65–99)
POTASSIUM: 4.7 mmol/L (ref 3.5–5.1)
Sodium: 128 mmol/L — ABNORMAL LOW (ref 135–145)

## 2017-02-19 LAB — GLUCOSE, CAPILLARY
Glucose-Capillary: 96 mg/dL (ref 65–99)
Glucose-Capillary: 200 mg/dL — ABNORMAL HIGH (ref 65–99)

## 2017-02-19 MED ORDER — LEVOTHYROXINE SODIUM 75 MCG PO TABS: 75.0000 ug | ORAL_TABLET | Freq: Every day | ORAL | Status: AC

## 2017-02-19 MED ORDER — OMEPRAZOLE 40 MG PO CPDR: 40.0000 mg | DELAYED_RELEASE_CAPSULE | Freq: Every day | ORAL | Status: AC

## 2017-02-19 MED ORDER — IPRATROPIUM-ALBUTEROL 0.5-2.5 (3) MG/3ML IN SOLN: 3.0000 mL | Freq: Four times a day (QID) | RESPIRATORY_TRACT | Status: AC

## 2017-02-19 MED ORDER — GLUCERNA SHAKE PO LIQD: 237.0000 mL | Freq: Two times a day (BID) | ORAL | 0 refills | Status: DC

## 2017-02-19 MED ORDER — IPRATROPIUM-ALBUTEROL 0.5-2.5 (3) MG/3ML IN SOLN: 3.0000 mL | Freq: Once | RESPIRATORY_TRACT | Status: DC

## 2017-02-19 MED ORDER — IPRATROPIUM-ALBUTEROL 0.5-2.5 (3) MG/3ML IN SOLN
3.0000 mL | Freq: Four times a day (QID) | RESPIRATORY_TRACT | Status: DC
  Administered 2017-02-19: 3 mL via RESPIRATORY_TRACT
  Filled 2017-02-19: qty 3

## 2017-02-19 MED ORDER — INSULIN GLARGINE 100 UNIT/ML ~~LOC~~ SOLN: 5.0000 [IU] | Freq: Every day | SUBCUTANEOUS | 12 refills | Status: DC

## 2017-02-19 NOTE — Discharge Summary (Signed)
Physician Discharge Summary  Cynthia Todd:[id number] DOB: 1918-12-24 DOA: 02/13/2017  PCP: Fleet Contras, MD  Admit date: 02/13/2017 Discharge date: 02/19/2017  Admitted From: Home  Disposition: SNF  Recommendations for Outpatient Follow-up:  1. Follow up with PCP in 1 weeks 2. Please consult dietitian for nutrition recommendations 3. Please arrange follow up with [facility]ist in 2-4 weeks 4. Please follow up with orthopedics Dr. Ave Filter in 2 weeks 5. Consider voiding trial in 1 week.   6. Arrange outpatient follow up with [facility] for urinary retention.  7. Routine indwelling foley catheter care protocol.   8. Please continue physical therapy 9. Please monitor blood glucose 3 times per day 10. Please obtain BMP/CBC in one week 11. Please give duoneb treatments every 6 hours and as needed for wheezing/shortness of breath 12. Please consult palliative medicine service to work with family regarding end of life care goals of care 13. Fall precautions recommended 14. Non-English Speaking Patient, Interpreter required to communicate effectively with patient  Discharge Condition: STABLE   CODE STATUS: FULL    Brief/Interim Summary:  HPI: Cynthia Todd is a 83 y.o. female with a past medical history of chronic diastolic CHF, diabetes, who had a mechanical fall earlier today resulting in pain in the right hip as well as knee. Patient does not speak any English, so not much history was available. She was noted to be grimacing and so was thought to be in pain. No chest pain or shortness of breath. History is very limited due to language and cognitive barriers.  ARF on stage 4 CKD Renal ultrasound=> medical renal disease Outpatient nephrology consult recommended.  Now that patient is eating and drinking better, would recheck BMP in 1 week.    Hyponatremia - improved with restriction of fluids.  normal cortisol, TSH Suspect some SIADH, recommend rechecking BMP in  1 week.    Right hip fracture: S/p R THA POD #4 Orthopedics has been consulted. Dr. Ave Filter took to OR 6/15 . Appreciateorthopedic input.  Follow up with Dr. Ave Filter in 2 weeks.  Family agreed to SNF placement Palliative medicine consult recommended for goals of care.   Ortho started on full dose aspirin for DVT prophylaxis.   Cardiomegaly, CHF (diastolic), MR, Mild-Moderate AR(cardiac echo 02/14/2017) Stable  Acute urinary retention - failed voiding trial in hospital, will keep foley in place for now, retry voiding trial in 1 week, follow up with [facility] outpatient.    Hyperkalemia  - Treated and resolved.    Chronic kidney disease stage 4 Hold diuretic for now, DC Losartan  Outpatient follow up with [facility] Recommended.       Diabetes mellitus type 2, continue Lantus 5 units daily, monitor blood glucose closely.   CBG (last 3)   Recent Labs  02/18/17 1705 02/18/17 2144 02/19/17 0836  GLUCAP 146* 157* 96   Hypertension uncontrolled,  Continue to adjust and titrate meds outpatient  Abnormal liver function RUQ ultrasound 6/16=> numerous hepatic granuloma  Thrombocytopenia resolved  Anemia of CKD - Stable   DVT Prophylaxis:SCDs Code Status:Full code Family Communication:granddaughter Consults called:Orthopedics Admission status:Inpatient  Discharge Diagnoses:  Active Problems:   Diabetes mellitus (HCC)   Diastolic heart failure, NYHA class 1 (HCC)   Pulmonary HTN (HCC)   CKD (chronic kidney disease) stage 3, GFR 30-59 ml/min   Closed right hip fracture (HCC)   Cardiomegaly   Closed fracture of neck of right femur (HCC)   Abnormal liver function   S/P  hip hemiarthroplasty   ARF (acute renal failure) (HCC)   Acute urinary retention    Discharge Instructions  Discharge Instructions    Ambulatory referral to Nephrology    Complete by:  As directed    Ambulatory referral to Urology    Complete by:  As directed     Increase activity slowly    Complete by:  As directed    Weight bearing as tolerated    Complete by:  As directed      Allergies as of 02/19/2017   No Known Allergies     Medication List    STOP taking these medications   albuterol (2.5 MG/3ML) 0.083% nebulizer solution Commonly known as:  PROVENTIL   albuterol 108 (90 Base) MCG/ACT inhaler Commonly known as:  PROVENTIL HFA;VENTOLIN HFA   aspirin 81 MG tablet Replaced by:  aspirin 325 MG EC tablet   cetirizine 10 MG tablet Commonly known as:  ZYRTEC   furosemide 20 MG tablet Commonly known as:  LASIX   ibuprofen 200 MG tablet Commonly known as:  ADVIL,MOTRIN   losartan-hydrochlorothiazide 50-12.5 MG tablet Commonly known as:  HYZAAR   vitamin B-12 250 MCG tablet Commonly known as:  CYANOCOBALAMIN     TAKE these medications   aspirin 325 MG EC tablet Take 1 tablet (325 mg total) by mouth daily with breakfast. Replaces:  aspirin 81 MG tablet   feeding supplement (GLUCERNA SHAKE) Liqd Take 237 mLs by mouth 2 (two) times daily between meals.   HYDROcodone-acetaminophen 5-325 MG tablet Commonly known as:  NORCO/VICODIN Take 1-2 tablets by mouth every 6 (six) hours as needed for moderate pain.   insulin glargine 100 UNIT/ML injection Commonly known as:  LANTUS Inject 0.05 mLs (5 Units total) into the skin daily. What changed:  how much to take   ipratropium-albuterol 0.5-2.5 (3) MG/3ML Soln Commonly known as:  DUONEB Take 3 mLs by nebulization every 6 (six) hours.   isosorbide-hydrALAZINE 20-37.5 MG tablet Commonly known as:  BIDIL Take 2 tablets by mouth 2 (two) times daily. Takes at least once daily. Only sometimes takes twice daily What changed:  how much to take  additional instructions   levothyroxine 75 MCG tablet Commonly known as:  SYNTHROID, LEVOTHROID Take 1 tablet (75 mcg total) by mouth daily before breakfast. What changed:  medication strength  how much to take   metoprolol succinate  50 MG 24 hr tablet Commonly known as:  TOPROL-XL Take 50 mg by mouth daily. Take with or immediately following a meal.   multivitamin with minerals Tabs tablet Take 1 tablet by mouth daily.   omeprazole 40 MG capsule Commonly known as:  PRILOSEC Take 1 capsule (40 mg total) by mouth daily. What changed:  medication strength  how much to take  when to take this            Durable Medical Equipment        Start     Ordered   02/16/17 0803  For home use only DME Walker rolling  Once    Question:  Patient needs a walker to treat with the following condition  Answer:  S/P hip hemiarthroplasty   02/16/17 0802   02/16/17 0803  For home use only DME Bedside commode  Once    Question:  Patient needs a bedside commode to treat with the following condition  Answer:  S/P hip hemiarthroplasty   02/16/17 0802     Follow-up Information    Jones Broomhandler, Justin, MD Follow up  in 2 week(s).   Specialty:  Orthopedic Surgery Contact information: [address] [place] [address] [phone]        Fleet Contras, MD. Schedule an appointment as soon as possible for a visit in 2 week(s).   Specialty:  Internal Medicine Contact information: [address] [place] [address] [phone]        K[facility], [place]. Schedule an appointment as soon as possible for a visit in 3 week(s).   Contact information: [address] [place] [address] [phone]        [place], [facility]. Schedule an appointment as soon as possible for a visit in 2 week(s).   Why:  Hospital follow up urinary retention Contact information: [address] [place] [address] [phone]          No Known Allergies  Procedures/Studies: Dg Chest 1 View  Result Date: 02/13/2017 CLINICAL DATA:  Fall. EXAM: CHEST 1 VIEW COMPARISON:  06/19/2016 FINDINGS: New cardiopericardial enlargement with possible pericardial effusion morphology. New pleural effusion on  the right that is small volume. Vascular congestion. No air bronchogram or pneumothorax. Osteopenia and diffuse spondylosis. No acute osseous finding. IMPRESSION: 1. Cardiopericardial enlargement that is new from 2017. There could be a pericardial effusion based on the shape. 2. Small volume right pleural fluid. 3. Pulmonary vascular congestion. Electronically Signed   By: Marnee Spring M.D.   On: 02/13/2017 15:04   US Renal  Result Date: 02/16/2017 CLINICAL DATA:  Acute renal failure, history diabetes mellitus, hypertension, stage III chronic kidney disease EXAM: RENAL / URINARY TRACT ULTRASOUND COMPLETE COMPARISON:  CT abdomen pelvis 04/14/2006 FINDINGS: Right Kidney: Length: 7.8 cm. Appear small and atrophic. Slightly increased cortical echogenicity. Cortical thinning. No gross mass or hydronephrosis. Left Kidney: Length: 8.5 cm. Cortical thinning with slightly increased cortical echogenicity. Septated cyst at upper pole LEFT kidney 2.9 x 3.7 x 3.2 cm, measured 4.1 cm greatest size in 2007. No hydronephrosis or definite solid mass. Bladder: Decompressed, unable to evaluate IMPRESSION: Atrophic appearing kidneys with medical renal disease changes. Septated cyst at upper pole LEFT kidney 3.7 cm greatest size. Electronically Signed   By: Ulyses Southward M.D.   On: 02/16/2017 19:49   Pelvis Portable  Result Date: 02/14/2017 CLINICAL DATA:  Right hip replacement. EXAM: PORTABLE PELVIS 1-2 VIEWS COMPARISON:  None. FINDINGS: The patient is status post right hip replacement. Hardware is in good position. Skin staples and postoperative air are identified. Atherosclerosis. IMPRESSION: Right hip replacement as above. Electronically Signed   By: Gerome Sam III M.D   On: 02/14/2017 19:43   Dg Knee Complete 4 Views Right  Result Date: 02/13/2017 CLINICAL DATA:  Right knee pain after fall today. EXAM: RIGHT KNEE - COMPLETE 4+ VIEW COMPARISON:  None. FINDINGS: No evidence of fracture, dislocation, or joint  effusion. Severe narrowing of the medial and lateral joint spaces is noted. Spurring of the patella is noted. Vascular calcifications are noted. IMPRESSION: Severe degenerative joint disease. No acute abnormality seen in the right knee. Electronically Signed   By: Lupita Raider, M.D.   On: 02/13/2017 15:05   Dg Hip Unilat W Or Wo Pelvis 2-3 Views Right  Result Date: 02/13/2017 CLINICAL DATA:  Fall.  Right hip and knee pain.  Initial encounter. EXAM: DG HIP (WITH OR WITHOUT PELVIS) 2-3V RIGHT COMPARISON:  None. FINDINGS: The right femur appears foreshortened and is in external rotation with appearance compatible with a femoral neck fracture. Detailed assessment is  limited by external rotation. The right femoral head remains seated in the acetabulum. Vascular calcifications are noted. IMPRESSION: Right femoral neck fracture. Electronically Signed   By: Sebastian Ache M.D.   On: 02/13/2017 15:10   US Abdomen Limited Ruq  Result Date: 02/14/2017 CLINICAL DATA:  Abnormal liver functions tests. History diabetes, chronic kidney disease, and hypertension. EXAM: ULTRASOUND ABDOMEN LIMITED RIGHT UPPER QUADRANT COMPARISON:  CT of the abdomen and pelvis on 04/14/2016 FINDINGS: Gallbladder: Gallbladder has a normal appearance. Gallbladder wall is 2.3 mm, within normal limits. No stones or pericholecystic fluid. No sonographic Murphy's sign. Common bile duct: Diameter: 1.8 mm Liver: Hepatic echotexture is normal in appearance. Numerous hyperechoic foci are identified within the liver, consistent with hepatic granulomata. No suspicious liver lesions are identified. Additional: Right pleural effusion is present. Note is made of echogenic right kidney. IMPRESSION: 1. No evidence for acute cholecystitis. 2. Numerous hepatic granulomata. 3. Echogenic right kidney, consistent with history of chronic kidney disease. Electronically Signed   By: Norva Pavlov M.D.   On: 02/14/2017 16:30     Subjective: Pt eating and  drinking better today.   Discharge Exam: Vitals:   02/19/17 1024 02/19/17 1055  BP: (!) 152/50   Pulse: 79 60  Resp:  20  Temp:     Vitals:   02/18/17 2143 02/19/17 0205 02/19/17 1024 02/19/17 1055  BP: (!) 152/54 (!) 152/50 (!) 152/50   Pulse: 70 79 79 60  Resp: 19 18  20   Temp: 98.2 F (36.8 C)     TempSrc: Oral     SpO2: 100%   98%  Weight:      Height:       General: Pt is alert, awake, not in acute distress, appears chronically ill.  Cardiovascular: RRR, S1/S2 +, no rubs, no gallops Respiratory: CTA bilaterally, no wheezing, no rhonchi Abdominal: Soft, NT, ND, bowel sounds + Extremities:  no cyanosis, wound c/d/i  The results of significant diagnostics from this hospitalization (including imaging, microbiology, ancillary and laboratory) are listed below for reference.     Microbiology: Recent Results (from the past 240 hour(s))  Surgical pcr screen     Status: None   Collection Time: 02/14/17  2:09 AM  Result Value Ref Range Status   MRSA, PCR NEGATIVE NEGATIVE Final   Staphylococcus aureus NEGATIVE NEGATIVE Final    Comment:        The Xpert SA Assay (FDA approved for NASAL specimens in patients over 59 years of age), is one component of a comprehensive surveillance program.  Test performance has been validated by [facility] for patients greater than or equal to 54 year old. It is not intended to diagnose infection nor to guide or monitor treatment.      Labs: BNP (last 3 results)  Recent Labs  06/19/16 1901 02/14/17 1230  BNP 974.4* 2,528.5*   Basic Metabolic Panel:  Recent Labs Lab 02/15/17 0440 02/16/17 0507 02/17/17 0331 02/18/17 0755 02/19/17 0624  NA 136 132* 129* 126* 128*  K 4.7 5.0 4.9 5.4* 4.7  CL 105 103 100* 100* 100*  CO2 23 20* 23 18* 20*  GLUCOSE 133* 130* 148* 147* 109*  BUN 37* 52* 59* 66* 71*  CREATININE 2.49* 2.85* 2.89* 3.07* 3.11*  CALCIUM 8.4* 8.3* 7.9* 8.1* 7.7*   Liver Function Tests:  Recent Labs Lab  02/13/17 1423 02/15/17 0440 02/17/17 0331 02/18/17 0755  AST 44* 40 35 45*  ALT 28 18 6* 6*  ALKPHOS 156* 109 135*  193*  BILITOT 1.8* 1.3* 1.1 1.3*  PROT 8.3* 6.7 5.9* 6.9  ALBUMIN 3.4* 2.3* 2.0* 2.3*   No results for input(s): LIPASE, AMYLASE in the last 168 hours. No results for input(s): AMMONIA in the last 168 hours. CBC:  Recent Labs Lab 02/13/17 1423  02/15/17 0440 02/16/17 0507 02/17/17 0331 02/18/17 0755 02/19/17 0624  WBC 7.4  < > 7.6 8.7 8.3 6.6 5.9  NEUTROABS 6.6  --   --   --   --   --   --   HGB 12.5  < > 9.7* 9.8* 9.4* 10.5* 8.7*  HCT 38.6  < > 31.0* 30.2* 29.4* 32.1* 27.1*  MCV 97.5  < > 98.7 97.4 95.8 95.3 95.1  PLT 129*  < > 149* 145* 188 206 204  < > = values in this interval not displayed. Cardiac Enzymes: No results for input(s): CKTOTAL, CKMB, CKMBINDEX, TROPONINI in the last 168 hours. BNP: Invalid input(s): POCBNP CBG:  Recent Labs Lab 02/18/17 0635 02/18/17 1245 02/18/17 1705 02/18/17 2144 02/19/17 0836  GLUCAP 127* 136* 146* 157* 96   D-Dimer No results for input(s): DDIMER in the last 72 hours. Hgb A1c No results for input(s): HGBA1C in the last 72 hours. Lipid Profile No results for input(s): CHOL, HDL, LDLCALC, TRIG, CHOLHDL, LDLDIRECT in the last 72 hours. Thyroid function studies  Recent Labs  02/17/17 0812  TSH 9.423*   Anemia work up No results for input(s): VITAMINB12, FOLATE, FERRITIN, TIBC, IRON, RETICCTPCT in the last 72 hours. Urinalysis    Component Value Date/Time   COLORURINE AMBER (A) 02/16/2017 1248   APPEARANCEUR HAZY (A) 02/16/2017 1248   LABSPEC 1.017 02/16/2017 1248   PHURINE 5.0 02/16/2017 1248   GLUCOSEU NEGATIVE 02/16/2017 1248   HGBUR NEGATIVE 02/16/2017 1248   BILIRUBINUR NEGATIVE 02/16/2017 1248   KETONESUR 5 (A) 02/16/2017 1248   PROTEINUR 30 (A) 02/16/2017 1248   UROBILINOGEN 0.2 06/13/2013 0431   NITRITE NEGATIVE 02/16/2017 1248   LEUKOCYTESUR NEGATIVE 02/16/2017 1248   Sepsis  Labs Invalid input(s): PROCALCITONIN,  WBC,  LACTICIDVEN Microbiology Recent Results (from the past 240 hour(s))  Surgical pcr screen     Status: None   Collection Time: 02/14/17  2:09 AM  Result Value Ref Range Status   MRSA, PCR NEGATIVE NEGATIVE Final   Staphylococcus aureus NEGATIVE NEGATIVE Final    Comment:        The Xpert SA Assay (FDA approved for NASAL specimens in patients over 32 years of age), is one component of a comprehensive surveillance program.  Test performance has been validated by [facility] for patients greater than or equal to 60 year old. It is not intended to diagnose infection nor to guide or monitor treatment.    Time coordinating discharge: 35 minutes  SIGNED:  Standley Dakins, MD  [facility] 02/19/2017, 1:19 PM Pager [phone]  If 7PM-7AM, please contact night-coverage [email or web address] Password [email or web address]

## 2017-02-19 NOTE — Clinical Social Work Placement (Signed)
   CLINICAL SOCIAL WORK PLACEMENT  NOTE  Date:  02/19/2017  Patient Details  Name: Cynthia Todd MRN: [id number] Date of Birth: 04/25/1919  Clinical Social Work is seeking post-discharge placement for this patient at the Skilled  Nursing Facility level of care (*CSW will initial, date and re-position this form in  chart as items are completed):  Yes   Patient/family provided with [facility] Clinical Social Work Department's list of facilities offering this level of care within the geographic area requested by the patient (or if unable, by the patient's family).  Yes   Patient/family informed of their freedom to choose among providers that offer the needed level of care, that participate in Medicare, Medicaid or managed care program needed by the patient, have an available bed and are willing to accept the patient.  Yes   Patient/family informed of [facility]'s ownership interest in [facility] and [facility], as well as of the fact that they are under no obligation to receive care at these facilities.  PASRR submitted to EDS on       PASRR number received on       Existing PASRR number confirmed on 02/17/17     FL2 transmitted to all facilities in geographic area requested by pt/family on 02/17/17     FL2 transmitted to all facilities within larger geographic area on 02/17/17     Patient informed that his/her managed care company has contracts with or will negotiate with certain facilities, including the following:        Yes   Patient/family informed of bed offers received.  Patient chooses bed at  [facility])     Physician recommends and patient chooses bed at      Patient to be transferred to  [facility]) on 02/19/17.  Patient to be transferred to facility by [facility]     Patient family notified on 02/19/17 of transfer.  Name of family member notified:  family at bedside, daughters, and granddaughter     PHYSICIAN Please prepare  priority discharge summary, including medications, Please prepare prescriptions, Please sign FL2     Additional Comment:    _______________________________________________ Cynthia MoorePatricia V Afton Lavalle, LCSW 02/19/2017, 2:25 PM

## 2017-02-19 NOTE — Progress Notes (Signed)
Reviewed discharge papers with family member and medications with full understanding

## 2017-02-19 NOTE — Progress Notes (Signed)
Called [facility] to give report to  Ms Freida Busmanllen, IV d/c and [facility]

## 2017-02-19 NOTE — Social Work (Signed)
Clinical Social Worker facilitated patient discharge including contacting patient family and facility to confirm patient discharge plans.  Clinical information faxed to facility and family agreeable with plan.    CSW arranged ambulance transport via [facility] to [facility].    RN to call [phone] to give report prior to discharge.  Clinical Social Worker will sign off for now as social work intervention is no longer needed. Please consult us again if new need arises.  Bakari Nikolai, LCSW Clinical Social Worker [phone]    

## 2017-02-23 ENCOUNTER — Encounter: Payer: Self-pay | Admitting: Internal Medicine

## 2017-02-23 ENCOUNTER — Non-Acute Institutional Stay (SKILLED_NURSING_FACILITY): Payer: Medicare Other | Admitting: Internal Medicine

## 2017-02-23 DIAGNOSIS — R946 Abnormal results of thyroid function studies: Secondary | ICD-10-CM | POA: Diagnosis not present

## 2017-02-23 DIAGNOSIS — Z794 Long term (current) use of insulin: Secondary | ICD-10-CM | POA: Diagnosis not present

## 2017-02-23 DIAGNOSIS — E1122 Type 2 diabetes mellitus with diabetic chronic kidney disease: Secondary | ICD-10-CM

## 2017-02-23 DIAGNOSIS — I5032 Chronic diastolic (congestive) heart failure: Secondary | ICD-10-CM

## 2017-02-23 DIAGNOSIS — K5901 Slow transit constipation: Secondary | ICD-10-CM

## 2017-02-23 DIAGNOSIS — E871 Hypo-osmolality and hyponatremia: Secondary | ICD-10-CM

## 2017-02-23 DIAGNOSIS — N184 Chronic kidney disease, stage 4 (severe): Secondary | ICD-10-CM

## 2017-02-23 DIAGNOSIS — Z96649 Presence of unspecified artificial hip joint: Secondary | ICD-10-CM

## 2017-02-23 DIAGNOSIS — R945 Abnormal results of liver function studies: Secondary | ICD-10-CM

## 2017-02-23 DIAGNOSIS — R338 Other retention of urine: Secondary | ICD-10-CM | POA: Diagnosis not present

## 2017-02-23 DIAGNOSIS — D638 Anemia in other chronic diseases classified elsewhere: Secondary | ICD-10-CM

## 2017-02-23 DIAGNOSIS — R7989 Other specified abnormal findings of blood chemistry: Secondary | ICD-10-CM

## 2017-02-23 NOTE — Progress Notes (Signed)
Patient ID: GENEAL HUEBERT, female   DOB: 01/15/1919, 82 y.o.   MRN: [id number]    HISTORY AND PHYSICAL   DATE: 02/23/2017  Location:    [facility] Room Number: 126A Place of Service: SNF (31)   Extended Emergency Contact Information Primary Emergency Contact: Guerry Minors [place] of [place] Phone: [phone] Relation: Grandson Secondary Emergency Contact: Buri,Amina Address: [address]          [place] [address] Johnnette [place] of [place] Phone: [phone] Mobile Phone: [phone] Relation: [place]  Advanced Directive information   DNR  Chief Complaint  Patient presents with  . New Admit To SNF    Admission    HPI:  83 yo female seen today as a new admission into SNF following hospital stay for right hip fx, hyponatremia, acute/CKD, acute urinary retention, diastolic HF, DM, HTN, elevated LFTs. She presented to the ED after mechanical fall. She had right hip pain --> xray revealed right hip fx. She was taken to the OR for THA. Palliative care consulted. Interpretive services utilized as pt nonEnglish speaking. plts dropped to 115K-->204K; Hgb dropped 11.5-->8.7; Cr 2.13-->3.11; BUN peaked 71; BNP 2528.5; Na dropped 132-->128; AST 45; alk phos 193;  Albumin 2.3; T bili 1.3; TSH 9.423. a1c prior to hospital stay 5.7% (Oct 2017). She presents to SNF for short term rehab.  Today she reports severe pain in right hip despite taking prn norco 5/325 ATC q6hrs. She also c/o constipation. No BM in several days. No f/c. no falls. There is a language barrier as pt speaks [place] and swahili. Her granddaughter is at bedside and translating. A1c 6.3%  Past Medical History:  Diagnosis Date  . Chronic diastolic CHF (congestive heart failure), NYHA class 2 ([place]) 2013  . CKD (chronic kidney disease) stage 3, GFR 30-59 ml/min 06/19/2016  . Dementia   . Diabetes mellitus   . GERD (gastroesophageal reflux disease) 06/19/2016  .  Hypertension   . Stroke ([place]) 06/20/2016   L cerebellar on MRI 06/20/2016  . Thyroid nodule 09/28/2011    Past Surgical History:  Procedure Laterality Date  . HIP ARTHROPLASTY Right 02/14/2017   Procedure: RIGHT HIP HEMIARTHROPLASTY;  Surgeon: Tania Ade, MD;  Location: [facility];  Service: Orthopedics;  Laterality: Right;  . None      Patient Care Team: Nolene Ebbs, MD as PCP - General (Internal Medicine)  Social History   Social History  . Marital status: Widowed    Spouse name: N/A  . Number of children: N/A  . Years of education: N/A   Occupational History  . Retired    Social History Main Topics  . Smoking status: Never Smoker  . Smokeless tobacco: Never Used  . Alcohol use No  . Drug use: No  . Sexual activity: No   Other Topics Concern  . Not on file   Social History Narrative   Pt lives with her daughter and a granddaughter.    The 2 granddaughters and the grandson help with translation.   The family is from [place].     reports that she has never smoked. She has never used smokeless tobacco. She reports that she does not drink alcohol or use drugs.  Family History  Problem Relation Age of Onset  . CAD Neg Hx    Family Status  Relation Status  . Mother Deceased  . Father Deceased  . Brother Alive  . Neg Hx (Not  Specified)    Immunization History  Administered Date(s) Administered  . Influenza,inj,Quad PF,36+ Mos 06/14/2013  . PPD Test 02/19/2017  . Pneumococcal Polysaccharide-23 09/29/2011, 06/14/2013    No Known Allergies  Medications: Patient's Medications  New Prescriptions   No medications on file  Previous Medications   ASPIRIN EC 325 MG EC TABLET    Take 1 tablet (325 mg total) by mouth daily with breakfast.   FEEDING SUPPLEMENT, GLUCERNA SHAKE, (GLUCERNA SHAKE) LIQD    Take 237 mLs by mouth 2 (two) times daily between meals.   HYDROCODONE-ACETAMINOPHEN (NORCO/VICODIN) 5-325 MG TABLET    Take 1-2 tablets by mouth every 6 (six)  hours as needed for moderate pain.   INSULIN GLARGINE (LANTUS) 100 UNIT/ML INJECTION    Inject 0.05 mLs (5 Units total) into the skin daily.   IPRATROPIUM-ALBUTEROL (DUONEB) 0.5-2.5 (3) MG/3ML SOLN    Take 3 mLs by nebulization every 6 (six) hours.   ISOSORBIDE-HYDRALAZINE (BIDIL) 20-37.5 MG TABLET    Take 2 tablets by mouth 2 (two) times daily. Takes at least once daily. Only sometimes takes twice daily   LEVOTHYROXINE (SYNTHROID, LEVOTHROID) 75 MCG TABLET    Take 1 tablet (75 mcg total) by mouth daily before breakfast.   METOPROLOL SUCCINATE (TOPROL-XL) 50 MG 24 HR TABLET    Take 50 mg by mouth daily. Take with or immediately following a meal.   MULTIPLE VITAMIN (MULTIVITAMIN WITH MINERALS) TABS TABLET    Take 1 tablet by mouth daily.   OMEPRAZOLE (PRILOSEC) 40 MG CAPSULE    Take 1 capsule (40 mg total) by mouth daily.  Modified Medications   No medications on file  Discontinued Medications   No medications on file    Review of Systems  Unable to perform ROS: Other (language barrier)    Vitals:   02/23/17 1041  BP: 130/70  Pulse: 72  Resp: 20  Temp: 97.5 F (36.4 C)  TempSrc: Oral  SpO2: 97%  Weight: 119 lb 0.8 oz (54 kg)  Height: 5' 5"  (1.651 m)   Body mass index is 19.81 kg/m.  Physical Exam  Constitutional: She appears well-developed and well-nourished.  Looks uncomfortable in NAD, lying in bed, moaning. adult Daughter and granddaughter at bedside  HENT:  Mouth/Throat: Oropharynx is clear and moist. No oropharyngeal exudate.  MMM; no oral thrush  Eyes: Pupils are equal, round, and reactive to light. No scleral icterus.  Neck: Neck supple. Carotid bruit is not present. No tracheal deviation present. No thyromegaly present.  Cardiovascular: Normal rate, regular rhythm and intact distal pulses.  Exam reveals no gallop and no friction rub.   Murmur (1/6 SEM) heard. +1 pitting LE edema b/l. No calf TTP  Pulmonary/Chest: Effort normal. No stridor. No respiratory distress.  She has wheezes (end expiratory). She has no rales.  Abdominal: Soft. Normal appearance and bowel sounds are normal. She exhibits distension. She exhibits no mass. There is no hepatomegaly. There is no tenderness. There is no rigidity, no rebound and no guarding. No hernia.  Genitourinary:  Genitourinary Comments: Foley cath intact and DTG orange appearing urine  Musculoskeletal: She exhibits edema and tenderness.  Right lateral thigh dsg c/d/i  Lymphadenopathy:    She has no cervical adenopathy.  Neurological: She is alert.  Skin: Skin is warm and dry. No rash noted.  Psychiatric: She has a normal mood and affect. Her behavior is normal. Thought content normal.     Labs reviewed: Admission on 02/13/2017, Discharged on 02/19/2017  No results displayed because  visit has over 200 results.  CBC Latest Ref Rng & Units 02/19/2017 02/18/2017 02/17/2017  WBC 4.0 - 10.5 K/uL 5.9 6.6 8.3  Hemoglobin 12.0 - 15.0 g/dL 8.7(L) 10.5(L) 9.4(L)  Hematocrit 36.0 - 46.0 % 27.1(L) 32.1(L) 29.4(L)  Platelets 150 - 400 K/uL 204 206 188   CMP Latest Ref Rng & Units 02/19/2017 02/18/2017 02/17/2017  Glucose 65 - 99 mg/dL 109(H) 147(H) 148(H)  BUN 6 - 20 mg/dL 71(H) 66(H) 59(H)  Creatinine 0.44 - 1.00 mg/dL 3.11(H) 3.07(H) 2.89(H)  Sodium 135 - 145 mmol/L 128(L) 126(L) 129(L)  Potassium 3.5 - 5.1 mmol/L 4.7 5.4(H) 4.9  Chloride 101 - 111 mmol/L 100(L) 100(L) 100(L)  CO2 22 - 32 mmol/L 20(L) 18(L) 23  Calcium 8.9 - 10.3 mg/dL 7.7(L) 8.1(L) 7.9(L)  Total Protein 6.5 - 8.1 g/dL - 6.9 5.9(L)  Total Bilirubin 0.3 - 1.2 mg/dL - 1.3(H) 1.1  Alkaline Phos 38 - 126 U/L - 193(H) 135(H)  AST 15 - 41 U/L - 45(H) 35  ALT 14 - 54 U/L - 6(L) 6(L)   Lab Results  Component Value Date   HGBA1C 5.7 (H) 06/19/2016   Lipid Panel  No results found for: CHOL, TRIG, HDL, CHOLHDL, VLDL, LDLCALC, LDLDIRECT      Dg Chest 1 View  Result Date: 02/13/2017 CLINICAL DATA:  Fall. EXAM: CHEST 1 VIEW COMPARISON:  06/19/2016  FINDINGS: New cardiopericardial enlargement with possible pericardial effusion morphology. New pleural effusion on the right that is small volume. Vascular congestion. No air bronchogram or pneumothorax. Osteopenia and diffuse spondylosis. No acute osseous finding. IMPRESSION: 1. Cardiopericardial enlargement that is new from 2017. There could be a pericardial effusion based on the shape. 2. Small volume right pleural fluid. 3. Pulmonary vascular congestion. Electronically Signed   By: Monte Fantasia M.D.   On: 02/13/2017 15:04   US Renal  Result Date: 02/16/2017 CLINICAL DATA:  Acute renal failure, history diabetes mellitus, hypertension, stage III chronic kidney disease EXAM: RENAL / URINARY TRACT ULTRASOUND COMPLETE COMPARISON:  CT abdomen pelvis 04/14/2006 FINDINGS: Right Kidney: Length: 7.8 cm. Appear small and atrophic. Slightly increased cortical echogenicity. Cortical thinning. No gross mass or hydronephrosis. Left Kidney: Length: 8.5 cm. Cortical thinning with slightly increased cortical echogenicity. Septated cyst at upper pole LEFT kidney 2.9 x 3.7 x 3.2 cm, measured 4.1 cm greatest size in 2007. No hydronephrosis or definite solid mass. Bladder: Decompressed, unable to evaluate IMPRESSION: Atrophic appearing kidneys with medical renal disease changes. Septated cyst at upper pole LEFT kidney 3.7 cm greatest size. Electronically Signed   By: Lavonia Dana M.D.   On: 02/16/2017 19:49   Pelvis Portable  Result Date: 02/14/2017 CLINICAL DATA:  Right hip replacement. EXAM: PORTABLE PELVIS 1-2 VIEWS COMPARISON:  None. FINDINGS: The patient is status post right hip replacement. Hardware is in good position. Skin staples and postoperative air are identified. Atherosclerosis. IMPRESSION: Right hip replacement as above. Electronically Signed   By: Dorise Bullion III M.D   On: 02/14/2017 19:43   Dg Knee Complete 4 Views Right  Result Date: 02/13/2017 CLINICAL DATA:  Right knee pain after fall today.  EXAM: RIGHT KNEE - COMPLETE 4+ VIEW COMPARISON:  None. FINDINGS: No evidence of fracture, dislocation, or joint effusion. Severe narrowing of the medial and lateral joint spaces is noted. Spurring of the patella is noted. Vascular calcifications are noted. IMPRESSION: Severe degenerative joint disease. No acute abnormality seen in the right knee. Electronically Signed   By: Marijo Conception, M.D.  On: 02/13/2017 15:05   Dg Hip Unilat W Or Wo Pelvis 2-3 Views Right  Result Date: 02/13/2017 CLINICAL DATA:  Fall.  Right hip and knee pain.  Initial encounter. EXAM: DG HIP (WITH OR WITHOUT PELVIS) 2-3V RIGHT COMPARISON:  None. FINDINGS: The right femur appears foreshortened and is in external rotation with appearance compatible with a femoral neck fracture. Detailed assessment is limited by external rotation. The right femoral head remains seated in the acetabulum. Vascular calcifications are noted. IMPRESSION: Right femoral neck fracture. Electronically Signed   By: Logan Bores M.D.   On: 02/13/2017 15:10   US Abdomen Limited Ruq  Result Date: 02/14/2017 CLINICAL DATA:  Abnormal liver functions tests. History diabetes, chronic kidney disease, and hypertension. EXAM: ULTRASOUND ABDOMEN LIMITED RIGHT UPPER QUADRANT COMPARISON:  CT of the abdomen and pelvis on 04/14/2016 FINDINGS: Gallbladder: Gallbladder has a normal appearance. Gallbladder wall is 2.3 mm, within normal limits. No stones or pericholecystic fluid. No sonographic Murphy's sign. Common bile duct: Diameter: 1.8 mm Liver: Hepatic echotexture is normal in appearance. Numerous hyperechoic foci are identified within the liver, consistent with hepatic granulomata. No suspicious liver lesions are identified. Additional: Right pleural effusion is present. Note is made of echogenic right kidney. IMPRESSION: 1. No evidence for acute cholecystitis. 2. Numerous hepatic granulomata. 3. Echogenic right kidney, consistent with history of chronic kidney disease.  Electronically Signed   By: Nolon Nations M.D.   On: 02/14/2017 16:30     Assessment/Plan   ICD-10-CM   1. S/P hip hemiarthroplasty Z96.649    right s/p fall; pain uncontrolled  2. Chronic diastolic heart failure (HCC) I50.32   3. Acute urinary retention R33.8   4. Elevated TSH R94.6   5. Type 2 diabetes mellitus with stage 4 chronic kidney disease, with long-term current use of insulin (HCC) E11.22    N18.4    Z79.4   6. Anemia, chronic disease D63.8   7. Hyponatremia E87.1   8. Elevated LFTs R79.89   9. Slow transit constipation K59.01      Check CBC w diff, CMP, lipid panel and A1c. Repeat TSH in 6 weeks  Palliative care consult  CXR 2 view for wheezing, CHF  Check abdominal xray to r/o obstruction  STOP Norco  START OxyIR 40m take 1 tab po q4hrs- hold for sedation. May give 13mfor severe pain  Urology consult  Foley cath care as indicated  Wound care as ordered  F/u with Ortho as scheduled  miralax 17gm daily for constipation  GOAL: short term rehab and d/c home when medically appropriate. Communicated with pt and nursing.  Will follow  Seba Madole S. CaPerlie [facility] [address] [phone] (Monday-Friday 8 AM - 5 PM) ([phone] 5 PM and follow prompts

## 2017-02-25 ENCOUNTER — Encounter: Payer: Self-pay | Admitting: Adult Health

## 2017-02-25 ENCOUNTER — Non-Acute Institutional Stay (SKILLED_NURSING_FACILITY): Payer: Medicare Other | Admitting: Adult Health

## 2017-02-25 DIAGNOSIS — K5901 Slow transit constipation: Secondary | ICD-10-CM

## 2017-02-25 DIAGNOSIS — Z794 Long term (current) use of insulin: Secondary | ICD-10-CM

## 2017-02-25 DIAGNOSIS — N183 Chronic kidney disease, stage 3 unspecified: Secondary | ICD-10-CM

## 2017-02-25 DIAGNOSIS — E1122 Type 2 diabetes mellitus with diabetic chronic kidney disease: Secondary | ICD-10-CM | POA: Diagnosis not present

## 2017-02-25 NOTE — Progress Notes (Signed)
Location:   [facility] Room Number: 126 A Place of Service:  SNF (31)   CODE STATUS: Full Code  No Known Allergies  Chief Complaint  Patient presents with  . Acute Visit    Follow up lab results    HPI:  Her labs have returned. Her renal function is without significant change. She k+ is elevated at 6.3. She was started on miralax for her colonic ileus. She is unable to participate in the hpi or ros.    Past Medical History:  Diagnosis Date  . Chronic diastolic CHF (congestive heart failure), NYHA class 2 ([facility]) 2013  . CKD (chronic kidney disease) stage 3, GFR 30-59 ml/min 06/19/2016  . Dementia   . Diabetes mellitus   . GERD (gastroesophageal reflux disease) 06/19/2016  . Hypertension   . Stroke ([facility]) 06/20/2016   L cerebellar on MRI 06/20/2016  . Thyroid nodule 09/28/2011    Past Surgical History:  Procedure Laterality Date  . HIP ARTHROPLASTY Right 02/14/2017   Procedure: RIGHT HIP HEMIARTHROPLASTY;  Surgeon: Tania Ade, MD;  Location: [facility];  Service: Orthopedics;  Laterality: Right;  . None      Social History   Social History  . Marital status: Widowed    Spouse name: N/A  . Number of children: N/A  . Years of education: N/A   Occupational History  . Retired    Social History Main Topics  . Smoking status: Never Smoker  . Smokeless tobacco: Never Used  . Alcohol use No  . Drug use: No  . Sexual activity: No   Other Topics Concern  . Not on file   Social History Narrative   Pt lives with her daughter and a granddaughter.    The 2 granddaughters and the grandson help with translation.   The family is from [place].   Family History  Problem Relation Age of Onset  . CAD Neg Hx       VITAL SIGNS BP (!) 131/93   Pulse 68   Temp 97.5 F (36.4 C)   Resp 18   Ht 5' 5"  (1.651 m)   Wt 144 lb 9.6 oz (65.6 kg)   SpO2 97%   BMI 24.06 kg/m   Patient's Medications  New Prescriptions   No medications on file  Previous  Medications   ASPIRIN EC 325 MG EC TABLET    Take 1 tablet (325 mg total) by mouth daily with breakfast.   INSULIN GLARGINE (LANTUS) 100 UNIT/ML INJECTION    Inject 0.05 mLs (5 Units total) into the skin daily.   IPRATROPIUM-ALBUTEROL (DUONEB) 0.5-2.5 (3) MG/3ML SOLN    Take 3 mLs by nebulization every 6 (six) hours.   ISOSORBIDE-HYDRALAZINE (BIDIL) 20-37.5 MG TABLET    Take 2 tablets by mouth 2 (two) times daily. Takes at least once daily. Only sometimes takes twice daily   LEVOTHYROXINE (SYNTHROID, LEVOTHROID) 75 MCG TABLET    Take 1 tablet (75 mcg total) by mouth daily before breakfast.   METOPROLOL SUCCINATE (TOPROL-XL) 50 MG 24 HR TABLET    Take 50 mg by mouth daily. Take with or immediately following a meal.   MULTIPLE VITAMIN (MULTIVITAMIN WITH MINERALS) TABS TABLET    Take 1 tablet by mouth daily.   NUTRITIONAL SUPPLEMENT LIQD    House 2.0 - Med Pass give 120cc by mouth two times daily   OMEPRAZOLE (PRILOSEC) 40 MG CAPSULE    Take 1 capsule (40 mg total) by mouth daily.   OXYCODONE (OXY IR/ROXICODONE)  5 MG IMMEDIATE RELEASE TABLET    Give 1 tablet by mouth every 6 hours for Moderate pain and 1 tablet by mouth every 4 hours as needed for Severe pain in addition to scheduled dose.  Hold for sedation   POLYETHYLENE GLYCOL (MIRALAX / GLYCOLAX) PACKET    Take 17 g by mouth daily.   POTASSIUM CHLORIDE (MICRO-K) 10 MEQ CR CAPSULE    Take 10 mEq by mouth daily. For supplement due to use of Lasix  Modified Medications   No medications on file  Discontinued Medications   FEEDING SUPPLEMENT, GLUCERNA SHAKE, (GLUCERNA SHAKE) LIQD    Take 237 mLs by mouth 2 (two) times daily between meals.   HYDROCODONE-ACETAMINOPHEN (NORCO/VICODIN) 5-325 MG TABLET    Take 1-2 tablets by mouth every 6 (six) hours as needed for moderate pain.     SIGNIFICANT DIAGNOSTIC EXAMS  02-14-17: 2-d echo: - Left ventricle: The cavity size was normal. Wall thickness was increased in a pattern of moderate LVH. Systolic function  was normal. The estimated ejection fraction was in the range of 55%  to 60%. Wall motion was normal; there were no regional wall motion abnormalities. Doppler parameters are consistent with restrictive physiology, indicative of decreased left ventricular diastolic compliance and/or increased left atrial pressure  Doppler parameters are consistent with high ventricular filling pressure. - Aortic valve: Mildly calcified annulus. Trileaflet; mildly  thickened leaflets. There was mild to moderate regurgitation. - Mitral valve: Mildly calcified annulus. Normal thickness leaflets There was moderate regurgitation.  - Left atrium: The atrium was severely dilated. - Right ventricle: The cavity size was mildly dilated. - Right atrium: The atrium was severely dilated. - Atrial septum: No defect or patent foramen ovale was identified. - Tricuspid valve: There was moderate-severe regurgitation. - Pulmonary arteries: Systolic pressure was moderately to severely  increased. PA peak pressure: 66 mm Hg (S). - Pericardium, extracardiac: Small circumferential pericardial  effusion. No evidence of tamponade physiology   02-14-17: abdomina ultrasound: 1. No evidence for acute cholecystitis. 2. Numerous hepatic granulomata. 3. Echogenic right kidney, consistent with history of chronic kidney disease.   02-16-17: renal ultrasound: Atrophic appearing kidneys with medical renal disease changes. Septated cyst at upper pole LEFT kidney 3.7 cm greatest size.  02-23-17: chest x-ray: cardiomegaly with small bilateral pleural effusion mild congestive changes  02-23-17: kub: moderate colonic ileus   LABS REVIEWED;   02-17-17: tsh 9.423 02-18-17: wbc 6.6; hgb 10.5; hct 32.1; mcv 95.3; plt 206; glucose 147; bun 66; creat 3.07; k+ 5.4; na++ 126; ca 8.1; ast 45; alt 6; alk phos 193; total bili: 1.3; albumin 2.3  02-25-17: wbc 5.1; hgb 9.6; hct 29.4; mcv 99.1; plt 339; glucose 71; bun 77.1; creat 2.60; k+ 6.3; n++ 128; ca 98.3; ast  35; alk phos 221; albumin 2.7 hgb a1c 6.3 chol 95; dl 46;trig 60; hdl 37    Review of Systems  Unable to perform ROS: Dementia    Physical Exam  Constitutional: No distress.  Eyes: Conjunctivae are normal.  Neck: Neck supple. No JVD present. No thyromegaly present.  Cardiovascular: Normal rate, regular rhythm and intact distal pulses.   Murmur heard. Respiratory: Effort normal. No respiratory distress. She has wheezes.  GI: Soft. Bowel sounds are normal. She exhibits no distension. There is no tenderness.  Genitourinary:  Genitourinary Comments: Foley   Musculoskeletal: She exhibits edema.  Able to move all extremities 1+ edema    Lymphadenopathy:    She has no cervical adenopathy.  Neurological: She is alert.  Skin: Skin is warm and dry. She is not diaphoretic.  Psychiatric: She has a normal mood and affect.    ASSESSMENT/ PLAN:  1. CKD stage III 2. Constipation:  3. Diabetes   Will stop her lantus as her ghb a1c is 6.3 Will give her kayexalate 30 gm X 1 now Will repeat bmp in the AM Will stop her k+ supplement     MD is aware of resident's narcotic use and is in agreement with current plan of care. We will attempt to wean resident as apropriate   Ok Edwards NP [facility]  Contact [phone] Monday through Friday 8am- 5pm  After hours call [phone]

## 2017-02-26 ENCOUNTER — Other Ambulatory Visit: Payer: Self-pay | Admitting: *Deleted

## 2017-02-26 LAB — BASIC METABOLIC PANEL
BUN: 81 — AB (ref 4–21)
Creatinine: 2.9 — AB (ref 0.5–1.1)
GLUCOSE: 145
Potassium: 5.4 — AB (ref 3.4–5.3)
SODIUM: 126 — AB (ref 137–147)

## 2017-02-26 MED ORDER — OXYCODONE HCL 5 MG PO TABS: ORAL_TABLET | ORAL | 0 refills | Status: DC

## 2017-02-26 NOTE — Telephone Encounter (Signed)
[facility] #[phone] Fax:[phone]  

## 2017-02-27 ENCOUNTER — Non-Acute Institutional Stay (SKILLED_NURSING_FACILITY): Payer: Medicare Other | Admitting: Adult Health

## 2017-02-27 ENCOUNTER — Other Ambulatory Visit: Payer: Self-pay

## 2017-02-27 ENCOUNTER — Encounter: Payer: Self-pay | Admitting: Adult Health

## 2017-02-27 DIAGNOSIS — K567 Ileus, unspecified: Secondary | ICD-10-CM | POA: Diagnosis not present

## 2017-02-27 DIAGNOSIS — N184 Chronic kidney disease, stage 4 (severe): Secondary | ICD-10-CM

## 2017-02-27 MED ORDER — OXYCODONE HCL 5 MG PO TABS: ORAL_TABLET | ORAL | 0 refills | Status: AC

## 2017-02-27 NOTE — Telephone Encounter (Signed)
RX faxed to [facility] @ [phone], phone number [phone] 

## 2017-02-27 NOTE — Progress Notes (Signed)
Location:   [facility] Room Number: 126 A Place of Service:  SNF (31)   CODE STATUS: Full Code  No Known Allergies  Chief Complaint  Patient presents with  . Acute Visit    Follow up Lab Results    HPI:  Her k+ level has improved; she was able to tolerate 1/2 of her kayexalate. Her sodium level remains stable. Her kub does demonstrate a mild ileus. She is taking miralax daily. She is unable to participate in the hpi or ros.    Past Medical History:  Diagnosis Date  . Chronic diastolic CHF (congestive heart failure), NYHA class 2 ([place]) 2013  . CKD (chronic kidney disease) stage 3, GFR 30-59 ml/min 06/19/2016  . Dementia   . Diabetes mellitus   . GERD (gastroesophageal reflux disease) 06/19/2016  . Hypertension   . Stroke ([place]) 06/20/2016   L cerebellar on MRI 06/20/2016  . Thyroid nodule 09/28/2011    Past Surgical History:  Procedure Laterality Date  . HIP ARTHROPLASTY Right 02/14/2017   Procedure: RIGHT HIP HEMIARTHROPLASTY;  Surgeon: Tania Ade, MD;  Location: [facility];  Service: Orthopedics;  Laterality: Right;  . None      Social History   Social History  . Marital status: Widowed    Spouse name: N/A  . Number of children: N/A  . Years of education: N/A   Occupational History  . Retired    Social History Main Topics  . Smoking status: Never Smoker  . Smokeless tobacco: Never Used  . Alcohol use No  . Drug use: No  . Sexual activity: No   Other Topics Concern  . Not on file   Social History Narrative   Pt lives with her daughter and a granddaughter.    The 2 granddaughters and the grandson help with translation.   The family is from [place].   Family History  Problem Relation Age of Onset  . CAD Neg Hx       VITAL SIGNS BP (!) 146/76   Pulse 64   Temp 97.5 F (36.4 C)   Resp 18   Ht 5' 5"  (1.651 m)   Wt 144 lb 9.6 oz (65.6 kg)   SpO2 97%   BMI 24.06 kg/m   Patient's Medications  New Prescriptions   No  medications on file  Previous Medications   ASPIRIN EC 325 MG EC TABLET    Take 1 tablet (325 mg total) by mouth daily with breakfast.   CHOLECALCIFEROL (VITAMIN D) 1000 UNITS TABLET    Give 2 tablets by mouth one time a day   FUROSEMIDE (LASIX) 20 MG TABLET    Take 20 mg by mouth daily.   IPRATROPIUM-ALBUTEROL (DUONEB) 0.5-2.5 (3) MG/3ML SOLN    Take 3 mLs by nebulization every 6 (six) hours.   ISOSORBIDE-HYDRALAZINE (BIDIL) 20-37.5 MG TABLET    Take 2 tablets by mouth 2 (two) times daily.   LEVOTHYROXINE (SYNTHROID, LEVOTHROID) 75 MCG TABLET    Take 1 tablet (75 mcg total) by mouth daily before breakfast.   METOPROLOL SUCCINATE (TOPROL-XL) 50 MG 24 HR TABLET    Take 50 mg by mouth daily. Take with or immediately following a meal.   MULTIPLE VITAMIN (MULTIVITAMIN WITH MINERALS) TABS TABLET    Take 1 tablet by mouth daily.   NUTRITIONAL SUPPLEMENT LIQD    House 2.0 - Med Pass give 120cc by mouth two times daily   OMEPRAZOLE (PRILOSEC) 40 MG CAPSULE    Take 1 capsule (40  mg total) by mouth daily.   OXYCODONE (OXY IR/ROXICODONE) 5 MG IMMEDIATE RELEASE TABLET    Take one tablet by mouth every 6 hours for pain; Take one tablet by mouth every 4 hours as needed for pain   POLYETHYLENE GLYCOL (MIRALAX / GLYCOLAX) PACKET    Take 17 g by mouth daily.  Modified Medications   No medications on file  Discontinued Medications   INSULIN GLARGINE (LANTUS) 100 UNIT/ML INJECTION    Inject 0.05 mLs (5 Units total) into the skin daily.   ISOSORBIDE-HYDRALAZINE (BIDIL) 20-37.5 MG TABLET    Take 2 tablets by mouth 2 (two) times daily. Takes at least once daily. Only sometimes takes twice daily   POTASSIUM CHLORIDE (MICRO-K) 10 MEQ CR CAPSULE    Take 10 mEq by mouth daily. For supplement due to use of Lasix     SIGNIFICANT DIAGNOSTIC EXAMS   02-14-17: 2-d echo: - Left ventricle: The cavity size was normal. Wall thickness was increased in a pattern of moderate LVH. Systolic function was normal. The estimated  ejection fraction was in the range of 55%  to 60%. Wall motion was normal; there were no regional wall motion abnormalities. Doppler parameters are consistent with restrictive physiology, indicative of decreased left ventricular diastolic compliance and/or increased left atrial pressure  Doppler parameters are consistent with high ventricular filling pressure. - Aortic valve: Mildly calcified annulus. Trileaflet; mildly  thickened leaflets. There was mild to moderate regurgitation. - Mitral valve: Mildly calcified annulus. Normal thickness leaflets There was moderate regurgitation.  - Left atrium: The atrium was severely dilated. - Right ventricle: The cavity size was mildly dilated. - Right atrium: The atrium was severely dilated. - Atrial septum: No defect or patent foramen ovale was identified. - Tricuspid valve: There was moderate-severe regurgitation. - Pulmonary arteries: Systolic pressure was moderately to severely  increased. PA peak pressure: 66 mm Hg (S). - Pericardium, extracardiac: Small circumferential pericardial  effusion. No evidence of tamponade physiology   02-14-17: abdomina ultrasound: 1. No evidence for acute cholecystitis. 2. Numerous hepatic granulomata. 3. Echogenic right kidney, consistent with history of chronic kidney disease.   02-16-17: renal ultrasound: Atrophic appearing kidneys with medical renal disease changes. Septated cyst at upper pole LEFT kidney 3.7 cm greatest size.  02-23-17: chest x-ray: cardiomegaly with small bilateral pleural effusion mild congestive changes  02-23-17: kub: moderate colonic ileus   02-26-17: kub: findings consistent with a mild ileus pattern of the colon unchanged from previous study minimal fecal retention in colon.     LABS REVIEWED;   02-17-17: tsh 9.423 02-18-17: wbc 6.6; hgb 10.5; hct 32.1; mcv 95.3; plt 206; glucose 147; bun 66; creat 3.07; k+ 5.4; na++ 126; ca 8.1; ast 45; alt 6; alk phos 193; total bili: 1.3; albumin 2.3    02-25-17: wbc 5.1; hgb 9.6; hct 29.4; mcv 99.1; plt 339; glucose 71; bun 77.1; creat 2.60; k+ 6.3; n++ 128; ca 98.3; ast 35; alk phos 221; albumin 2.7 hgb a1c 6.3 chol 95; dl 46;trig 60; hdl 37  02-26-17: glucose 145; bun 80.9;creat 2.85; k+ 5.4; na++ 126;   Review of Systems  Unable to perform ROS: Dementia    Physical Exam  Constitutional: No distress.  Eyes: Conjunctivae are normal.  Neck: Neck supple. No JVD present. No thyromegaly present.  Cardiovascular: Normal rate, regular rhythm and intact distal pulses.   Murmur heard. Respiratory: Effort normal. No respiratory distress. She has wheezes.  GI: Soft. Bowel sounds are normal. She exhibits no distension. There is  no tenderness.  Genitourinary:  Genitourinary Comments: Foley   Musculoskeletal: She exhibits edema.  Able to move all extremities 1+ edema    Lymphadenopathy:    She has no cervical adenopathy.  Neurological: She is alert.  Skin: Skin is warm and dry. She is not diaphoretic.  Psychiatric: She has a normal mood and affect.    ASSESSMENT/ PLAN:  1. CKD stage IV 2. Mild ileus  Will not make changes at this time for her electrolytes. Will give her dulcolax supp; will monitor her response    Time spent with patient 30   minutes >50% time spent counseling; reviewing medical record; tests; labs; and developing future plan of care   MD is aware of resident's narcotic use and is in agreement with current plan of care. We will attempt to wean resident as apropriate   Ok Edwards NP [facility]  Contact [phone] Monday through Friday 8am- 5pm  After hours call [phone]

## 2017-03-03 ENCOUNTER — Encounter: Payer: Self-pay | Admitting: Adult Health

## 2017-03-03 ENCOUNTER — Non-Acute Institutional Stay (SKILLED_NURSING_FACILITY): Payer: Medicare Other | Admitting: Adult Health

## 2017-03-03 DIAGNOSIS — N184 Chronic kidney disease, stage 4 (severe): Secondary | ICD-10-CM

## 2017-03-03 DIAGNOSIS — S72001S Fracture of unspecified part of neck of right femur, sequela: Secondary | ICD-10-CM

## 2017-03-03 DIAGNOSIS — I5030 Unspecified diastolic (congestive) heart failure: Secondary | ICD-10-CM | POA: Diagnosis not present

## 2017-03-03 NOTE — Progress Notes (Signed)
Location:   [facility] Room Number: 126 A Place of Service:  SNF (31)    CODE STATUS: Full Code  No Known Allergies  Chief Complaint  Patient presents with  . Discharge Note    Discharge to Home    HPI:   She is being discharged to home with her her family. She had been hospitalized for a right hip fracture; chf; ckd stage IV with urine retention. She will be going home with hospice care. She will need a raised toilet; and a wheel chair and tub bench. She will need her prescriptions written and will follow up with her medical provider.    Past Medical History:  Diagnosis Date  . Chronic diastolic CHF (congestive heart failure), NYHA class 2 ([place]) 2013  . CKD (chronic kidney disease) stage 3, GFR 30-59 ml/min 06/19/2016  . Dementia   . Diabetes mellitus   . GERD (gastroesophageal reflux disease) 06/19/2016  . Hypertension   . Stroke ([place]) 06/20/2016   L cerebellar on MRI 06/20/2016  . Thyroid nodule 09/28/2011    Past Surgical History:  Procedure Laterality Date  . HIP ARTHROPLASTY Right 02/14/2017   Procedure: RIGHT HIP HEMIARTHROPLASTY;  Surgeon: Tania Ade, MD;  Location: [facility];  Service: Orthopedics;  Laterality: Right;  . None      Social History   Social History  . Marital status: Widowed    Spouse name: N/A  . Number of children: N/A  . Years of education: N/A   Occupational History  . Retired    Social History Main Topics  . Smoking status: Never Smoker  . Smokeless tobacco: Never Used  . Alcohol use No  . Drug use: No  . Sexual activity: No   Other Topics Concern  . Not on file   Social History Narrative   Pt lives with her daughter and a granddaughter.    The 2 granddaughters and the grandson help with translation.   The family is from [place].   Family History  Problem Relation Age of Onset  . CAD Neg Hx     VITAL SIGNS BP 129/74   Pulse 63   Temp 97.9 F (36.6 C)   Resp 20   Ht _0  (1.651 m)   Wt 148 lb  3.2 oz (67.2 kg)   SpO2 99%   BMI 24.66 kg/m   Patient's Medications  New Prescriptions   No medications on file  Previous Medications   ASPIRIN EC 325 MG EC TABLET    Take 1 tablet (325 mg total) by mouth daily with breakfast.   CHOLECALCIFEROL (VITAMIN D) 1000 UNITS TABLET    Give 2 tablets by mouth one time a day   FUROSEMIDE (LASIX) 20 MG TABLET    Take 20 mg by mouth daily.   IPRATROPIUM-ALBUTEROL (DUONEB) 0.5-2.5 (3) MG/3ML SOLN    Take 3 mLs by nebulization every 6 (six) hours.   ISOSORBIDE-HYDRALAZINE (BIDIL) 20-37.5 MG TABLET    Take 2 tablets by mouth 2 (two) times daily.   LEVOTHYROXINE (SYNTHROID, LEVOTHROID) 75 MCG TABLET    Take 1 tablet (75 mcg total) by mouth daily before breakfast.   METOPROLOL SUCCINATE (TOPROL-XL) 50 MG 24 HR TABLET    Take 50 mg by mouth daily. Take with or immediately following a meal.   MULTIPLE VITAMIN (MULTIVITAMIN WITH MINERALS) TABS TABLET    Take 1 tablet by mouth daily.   NUTRITIONAL SUPPLEMENT LIQD    House 2.0 - Med Pass give 120cc by  mouth two times daily   OMEPRAZOLE (PRILOSEC) 40 MG CAPSULE    Take 1 capsule (40 mg total) by mouth daily.   OXYCODONE (OXY IR/ROXICODONE) 5 MG IMMEDIATE RELEASE TABLET    Take one tablet by mouth every 6 hours for pain; Take one tablet by mouth every 4 hours as needed for pain   POLYETHYLENE GLYCOL (MIRALAX / GLYCOLAX) PACKET    Take 17 g by mouth daily.  Modified Medications   No medications on file  Discontinued Medications   No medications on file     SIGNIFICANT DIAGNOSTIC EXAMS   02-14-17: 2-d echo: - Left ventricle: The cavity size was normal. Wall thickness was increased in a pattern of moderate LVH. Systolic function was normal. The estimated ejection fraction was in the range of 55%  to 60%. Wall motion was normal; there were no regional wall motion abnormalities. Doppler parameters are consistent with restrictive physiology, indicative of decreased left ventricular diastolic compliance and/or  increased left atrial pressure  Doppler parameters are consistent with high ventricular filling pressure. - Aortic valve: Mildly calcified annulus. Trileaflet; mildly  thickened leaflets. There was mild to moderate regurgitation. - Mitral valve: Mildly calcified annulus. Normal thickness leaflets There was moderate regurgitation.  - Left atrium: The atrium was severely dilated. - Right ventricle: The cavity size was mildly dilated. - Right atrium: The atrium was severely dilated. - Atrial septum: No defect or patent foramen ovale was identified. - Tricuspid valve: There was moderate-severe regurgitation. - Pulmonary arteries: Systolic pressure was moderately to severely  increased. PA peak pressure: 66 mm Hg (S). - Pericardium, extracardiac: Small circumferential pericardial  effusion. No evidence of tamponade physiology   02-14-17: abdomina ultrasound: 1. No evidence for acute cholecystitis. 2. Numerous hepatic granulomata. 3. Echogenic right kidney, consistent with history of chronic kidney disease.   02-16-17: renal ultrasound: Atrophic appearing kidneys with medical renal disease changes. Septated cyst at upper pole LEFT kidney 3.7 cm greatest size.  02-23-17: chest x-ray: cardiomegaly with small bilateral pleural effusion mild congestive changes  02-23-17: kub: moderate colonic ileus   02-26-17: kub: findings consistent with a mild ileus pattern of the colon unchanged from previous study minimal fecal retention in colon.     LABS REVIEWED;   02-17-17: tsh 9.423 02-18-17: wbc 6.6; hgb 10.5; hct 32.1; mcv 95.3; plt 206; glucose 147; bun 66; creat 3.07; k+ 5.4; na++ 126; ca 8.1; ast 45; alt 6; alk phos 193; total bili: 1.3; albumin 2.3  02-25-17: wbc 5.1; hgb 9.6; hct 29.4; mcv 99.1; plt 339; glucose 71; bun 77.1; creat 2.60; k+ 6.3; n++ 128; ca 98.3; ast 35; alk phos 221; albumin 2.7 hgb a1c 6.3 chol 95; dl 46;trig 60; hdl 37  02-26-17: glucose 145; bun 80.9;creat 2.85; k+ 5.4; na++ 126;    Review of Systems  Unable to perform ROS: Dementia    Physical Exam  Constitutional: No distress.  Eyes: Conjunctivae are normal.  Neck: Neck supple. No JVD present. No thyromegaly present.  Cardiovascular: Normal rate, regular rhythm and intact distal pulses.   Murmur heard. Respiratory: Effort normal. No respiratory distress. She has wheezes.  GI: Soft. Bowel sounds are normal. She exhibits no distension. There is no tenderness.  Genitourinary:  Genitourinary Comments: Foley   Musculoskeletal: She exhibits edema.  Able to move all extremities 1+ edema    Lymphadenopathy:    She has no cervical adenopathy.  Neurological: She is alert.  Skin: Skin is warm and dry. She is not diaphoretic.  Psychiatric:  She has a normal mood and affect.     ASSESSMENT/ PLAN:  Patient is being discharged with the following home health services:  Hospice care  Patient is being discharged with the following durable medical equipment:  raised toilet; tub bench. A standard wheelchair with elevated leg rests; cushion; anti-tippers; and brake extensions to allow her to maintain her current level of independence with her adls which cannot be obtained with a walker. Can self propel.   Patient has been advised to f/u with their PCP in 1-2 weeks to bring them up to date on their rehab stay.  Social services at facility was responsible for arranging this appointment.  Pt was provided with a 30 day supply of prescriptions for medications and refills must be obtained from their PCP.  For controlled substances, a more limited supply may be provided adequate until PCP appointment only. #20 oxycodone 5 mg tabs   Time spent with patient 40   minutes >50% time spent counseling; reviewing medical record; tests; labs; and developing future plan of care   Ok Edwards NP [facility]  Contact [phone] Monday through Friday 8am- 5pm  After hours call [phone]

## 2017-03-04 DIAGNOSIS — K5901 Slow transit constipation: Secondary | ICD-10-CM | POA: Insufficient documentation

## 2017-03-04 LAB — BASIC METABOLIC PANEL
BUN: 81 — AB (ref 4–21)
CREATININE: 2.9 — AB (ref 0.5–1.1)
Glucose: 157
Potassium: 6.1 — AB (ref 3.4–5.3)
Sodium: 126 — AB (ref 137–147)

## 2017-03-05 ENCOUNTER — Encounter: Payer: Self-pay | Admitting: Adult Health

## 2017-03-05 LAB — BASIC METABOLIC PANEL
BUN: 78 — AB (ref 4–21)
CREATININE: 3.2 — AB (ref 0.5–1.1)
GLUCOSE: 127
POTASSIUM: 5.5 — AB (ref 3.4–5.3)
SODIUM: 123 — AB (ref 137–147)

## 2017-03-05 NOTE — Progress Notes (Signed)
Location:   [facility] Room Number: 132 A Place of Service:  SNF (31)   CODE STATUS: Full Code  No Known Allergies  Chief Complaint  Patient presents with  . Acute Visit    Lab follow up    HPI:    Past Medical History:  Diagnosis Date  . Chronic diastolic CHF (congestive heart failure), NYHA class 2 ([facility]) 2013  . CKD (chronic kidney disease) stage 3, GFR 30-59 ml/min 06/19/2016  . Dementia   . Diabetes mellitus   . GERD (gastroesophageal reflux disease) 06/19/2016  . Hypertension   . Stroke ([facility]) 06/20/2016   L cerebellar on MRI 06/20/2016  . Thyroid nodule 09/28/2011    Past Surgical History:  Procedure Laterality Date  . HIP ARTHROPLASTY Right 02/14/2017   Procedure: RIGHT HIP HEMIARTHROPLASTY;  Surgeon: Jones Broom, MD;  Location: [facility] OR;  Service: Orthopedics;  Laterality: Right;  . None      Social History   Social History  . Marital status: Widowed    Spouse name: N/A  . Number of children: N/A  . Years of education: N/A   Occupational History  . Retired    Social History Main Topics  . Smoking status: Never Smoker  . Smokeless tobacco: Never Used  . Alcohol use No  . Drug use: No  . Sexual activity: No   Other Topics Concern  . Not on file   Social History Narrative   Pt lives with her daughter and a granddaughter.    The 2 granddaughters and the grandson help with translation.   The family is from [place].   Family History  Problem Relation Age of Onset  . CAD Neg Hx       VITAL SIGNS BP 120/66   Pulse 78   Temp 97.6 F (36.4 C)   Resp 18   Ht 5\' 5"  (1.651 m)   Wt 139 lb 4.8 oz (63.2 kg)   SpO2 98%   BMI 23.18 kg/m   Patient's Medications  New Prescriptions   No medications on file  Previous Medications   ASPIRIN EC 325 MG EC TABLET    Take 1 tablet (325 mg total) by mouth daily with breakfast.   CHOLECALCIFEROL (VITAMIN D) 1000 UNITS TABLET    Give 2 tablets by mouth one time a day   IPRATROPIUM-ALBUTEROL (DUONEB) 0.5-2.5 (3) MG/3ML SOLN    Take 3 mLs by nebulization every 6 (six) hours.   ISOSORBIDE-HYDRALAZINE (BIDIL) 20-37.5 MG TABLET    Take 2 tablets by mouth 2 (two) times daily.   LEVOTHYROXINE (SYNTHROID, LEVOTHROID) 75 MCG TABLET    Take 1 tablet (75 mcg total) by mouth daily before breakfast.   METOPROLOL SUCCINATE (TOPROL-XL) 50 MG 24 HR TABLET    Take 50 mg by mouth daily. Take with or immediately following a meal.   MULTIPLE VITAMIN (MULTIVITAMIN WITH MINERALS) TABS TABLET    Take 1 tablet by mouth daily.   NUTRITIONAL SUPPLEMENT LIQD    House 2.0 - Med Pass give 120cc by mouth two times daily   OMEPRAZOLE (PRILOSEC) 40 MG CAPSULE    Take 1 capsule (40 mg total) by mouth daily.   OXYCODONE (OXY IR/ROXICODONE) 5 MG IMMEDIATE RELEASE TABLET    Take one tablet by mouth every 6 hours for pain; Take one tablet by mouth every 4 hours as needed for pain   POLYETHYLENE GLYCOL (MIRALAX / GLYCOLAX) PACKET    Take 17 g by mouth daily.   TAMSULOSIN (FLOMAX)  0.4 MG CAPS CAPSULE    Take 0.4 mg by mouth daily.  Modified Medications   No medications on file  Discontinued Medications   FUROSEMIDE (LASIX) 20 MG TABLET    Take 20 mg by mouth daily.     SIGNIFICANT DIAGNOSTIC EXAMS       ASSESSMENT/ PLAN:    MD is aware of resident's narcotic use and is in agreement with current plan of care. We will attempt to wean resident as apropriate   Synthia Innocenteborah Green NP [facility]  Contact [phone] Monday through Friday 8am- 5pm  After hours call [phone]

## 2017-03-09 DIAGNOSIS — K567 Ileus, unspecified: Secondary | ICD-10-CM | POA: Insufficient documentation

## 2017-03-09 DIAGNOSIS — N184 Chronic kidney disease, stage 4 (severe): Secondary | ICD-10-CM | POA: Insufficient documentation

## 2017-03-18 NOTE — Progress Notes (Signed)
This encounter was created in error - please disregard.

## 2017-04-07 ENCOUNTER — Encounter (HOSPITAL_COMMUNITY): Payer: Self-pay | Admitting: Emergency Medicine

## 2017-04-07 ENCOUNTER — Emergency Department (HOSPITAL_COMMUNITY)
Admission: EM | Admit: 2017-04-07 | Discharge: 2017-05-02 | Disposition: E | Attending: Emergency Medicine | Admitting: Emergency Medicine

## 2017-04-07 DIAGNOSIS — I469 Cardiac arrest, cause unspecified: Secondary | ICD-10-CM | POA: Insufficient documentation

## 2017-04-07 DIAGNOSIS — I130 Hypertensive heart and chronic kidney disease with heart failure and stage 1 through stage 4 chronic kidney disease, or unspecified chronic kidney disease: Secondary | ICD-10-CM | POA: Diagnosis not present

## 2017-04-07 DIAGNOSIS — E1140 Type 2 diabetes mellitus with diabetic neuropathy, unspecified: Secondary | ICD-10-CM | POA: Diagnosis not present

## 2017-04-07 DIAGNOSIS — Z79899 Other long term (current) drug therapy: Secondary | ICD-10-CM | POA: Insufficient documentation

## 2017-04-07 DIAGNOSIS — N183 Chronic kidney disease, stage 3 (moderate): Secondary | ICD-10-CM | POA: Diagnosis not present

## 2017-04-07 DIAGNOSIS — I509 Heart failure, unspecified: Secondary | ICD-10-CM | POA: Diagnosis not present

## 2017-04-07 DIAGNOSIS — Z794 Long term (current) use of insulin: Secondary | ICD-10-CM | POA: Insufficient documentation

## 2017-04-07 LAB — I-STAT CHEM 8, ED
BUN: 47 mg/dL — ABNORMAL HIGH (ref 6–20)
Calcium, Ion: 0.85 mmol/L — CL (ref 1.15–1.40)
Chloride: 101 mmol/L (ref 101–111)
Creatinine, Ser: 2.8 mg/dL — ABNORMAL HIGH (ref 0.44–1.00)
Glucose, Bld: 117 mg/dL — ABNORMAL HIGH (ref 65–99)
HCT: 49 % — ABNORMAL HIGH (ref 36.0–46.0)
Hemoglobin: 16.7 g/dL — ABNORMAL HIGH (ref 12.0–15.0)
Potassium: 6.4 mmol/L (ref 3.5–5.1)
Sodium: 125 mmol/L — ABNORMAL LOW (ref 135–145)
TCO2: 17 mmol/L (ref 0–100)

## 2017-04-07 LAB — I-STAT TROPONIN, ED: Troponin i, poc: 0.29 ng/mL (ref 0.00–0.08)

## 2017-04-07 MED ORDER — MORPHINE SULFATE (PF) 4 MG/ML IV SOLN
INTRAVENOUS | Status: AC
  Filled 2017-04-07: qty 1

## 2017-04-07 MED ORDER — SODIUM BICARBONATE 8.4 % IV SOLN
INTRAVENOUS | Status: AC | PRN
  Administered 2017-04-07: 50 meq via INTRAVENOUS

## 2017-04-07 MED ORDER — EPINEPHRINE PF 1 MG/10ML IJ SOSY
PREFILLED_SYRINGE | INTRAMUSCULAR | Status: AC | PRN
  Administered 2017-04-07 (×4): 1 via INTRAVENOUS

## 2017-04-07 MED ORDER — MORPHINE SULFATE 2 MG/ML IJ SOLN
INTRAMUSCULAR | Status: AC | PRN
  Administered 2017-04-07 (×2): 2 mg via INTRAVENOUS

## 2017-04-07 NOTE — Code Documentation (Signed)
King airway pulled By Md , Family present at bedside , morphine given for comfort , Md to bedside to declare tod

## 2017-04-07 NOTE — Progress Notes (Signed)
Responded to page for post-CPR pt, w/ extended Muslim family. Pt's grandson is [employer]. After his mom/ pt's daughter arrived, decision made in consultation rm w/ doctor not to continue breathing tube. Pt passed in short order.   Provided emotional/psychological/grief support, especially to immediate relatives. Imam came; family group prayed w/ him. Gave pt placement card to grandson, who said they'd wish to receive opt's body asap.  Explained [facility] process to grandson, and Imam (to whose [facility] appeared [facility]'s [facility] would be released) said that would occur in the morning. Many relatives are awaiting the arrival of last granddaughter who is coming They will remain w/ pt's body until it's taken to the morgue.   04/02/2017 1900  Clinical Encounter Type  Visited With Family;Health care provider  Visit Type Initial;Follow-up;Psychological support;Spiritual support;Social support;Death;ED;Patient actively dying  Spiritual Encounters  Spiritual Needs Emotional;Grief support  Stress Factors  Family Stress Factors Loss   Ephraim Hamburgerynthia A Evonda Enge, [address]

## 2017-04-07 NOTE — ED Provider Notes (Signed)
[facility]-EMERGENCY DEPT Provider Note   CSN: [id number] Arrival date & time: 04/26/2017  1750     History   Chief Complaint Chief Complaint  Patient presents with  . Cardiac Arrest    HPI Cynthia Todd is a 83 y.o. female.  The history is provided by the EMS personnel and a relative. No language interpreter was used.   Cynthia Todd is a 83 y.o. female who presents to the Emergency Department complaining of CPR. Level V caveat due to unresponsiveness. Per EMS and family she was last seen normal earlier today. Family checked on her a few moments later and she was noted to be unresponsive. 911 was called and CPR was initiated. EMS provided CPR and she received epinephrine with return of circulation. The [facility] airway was placed prior to ED arrival. She is currently a hospice patient but is full code. Past Medical History:  Diagnosis Date  . Chronic diastolic CHF (congestive heart failure), NYHA class 2 (HCC) 2013  . CKD (chronic kidney disease) stage 3, GFR 30-59 ml/min 06/19/2016  . Dementia   . Diabetes mellitus   . GERD (gastroesophageal reflux disease) 06/19/2016  . Hypertension   . Stroke (HCC) 06/20/2016   L cerebellar on MRI 06/20/2016  . Thyroid nodule 09/28/2011    Patient Active Problem List   Diagnosis Date Noted  . Ileus (HCC) 03/09/2017  . Chronic kidney disease (CKD), stage IV (severe) (HCC) 03/09/2017  . Constipation, slow transit 03/04/2017  . Acute urinary retention 02/18/2017  . ARF (acute renal failure) (HCC) 02/16/2017  . Abnormal liver function   . S/P hip hemiarthroplasty   . Closed fracture of neck of right femur (HCC)   . Closed right hip fracture (HCC) 02/13/2017  . Cardiomegaly 02/13/2017  . Syncope 06/19/2016  . GERD (gastroesophageal reflux disease) 06/19/2016  . CKD (chronic kidney disease) stage 3, GFR 30-59 ml/min 06/19/2016  . Hypertensive urgency 11/17/2014  . Diabetic nephropathy (HCC) 06/15/2013  . Acute respiratory failure (HCC) 06/15/2013    . Insulin dependent diabetes mellitus (HCC) 06/14/2013  . Acute on chronic diastolic CHF (congestive heart failure) (HCC) 06/13/2013  . Influenza 10/02/2011  . Diastolic heart failure, NYHA class 1 (HCC) 09/29/2011  . Pulmonary HTN (HCC) 09/29/2011  . Shortness of breath 09/28/2011  . Hypokalemia 09/28/2011  . Chest pain on breathing 09/28/2011  . Diabetes mellitus (HCC) 09/28/2011  . Malignant hypertension 09/28/2011    Past Surgical History:  Procedure Laterality Date  . HIP ARTHROPLASTY Right 02/14/2017   Procedure: RIGHT HIP HEMIARTHROPLASTY;  Surgeon: Jones Broom, MD;  Location: [facility] OR;  Service: Orthopedics;  Laterality: Right;  . None      OB History    No data available       Home Medications    Prior to Admission medications   Medication Sig Start Date End Date Taking? Authorizing Provider  aspirin EC 325 MG EC tablet Take 1 tablet (325 mg total) by mouth daily with breakfast. 02/16/17   Jiles Harold, PA-C  cholecalciferol (VITAMIN D) 1000 units tablet Give 2 tablets by mouth one time a day    [provider]  ipratropium-albuterol (DUONEB) 0.5-2.5 (3) MG/3ML SOLN Take 3 mLs by nebulization every 6 (six) hours. 02/19/17   Johnson, Clanford L, MD  isosorbide-hydrALAZINE (BIDIL) 20-37.5 MG tablet Take 2 tablets by mouth 2 (two) times daily.    [provider]  levothyroxine (SYNTHROID, LEVOTHROID) 75 MCG tablet Take 1 tablet (75 mcg total) by mouth daily before breakfast.  02/19/17   Johnson, Clanford L, MD  metoprolol succinate (TOPROL-XL) 50 MG 24 hr tablet Take 50 mg by mouth daily. Take with or immediately following a meal.    [provider]  Multiple Vitamin (MULTIVITAMIN WITH MINERALS) TABS tablet Take 1 tablet by mouth daily.    [provider]  NUTRITIONAL SUPPLEMENT LIQD House 2.0 - Med Pass give 120cc by mouth two times daily    [provider]  omeprazole (PRILOSEC) 40 MG capsule Take 1 capsule (40 mg total)  by mouth daily. 02/19/17   Johnson, Clanford L, MD  oxyCODONE (OXY IR/ROXICODONE) 5 MG immediate release tablet Take one tablet by mouth every 6 hours for pain; Take one tablet by mouth every 4 hours as needed for pain 02/27/17   Kirt Boys, DO  polyethylene glycol [facility] / GLYCOLAX) packet Take 17 g by mouth daily.    [provider]  tamsulosin (FLOMAX) 0.4 MG CAPS capsule Take 0.4 mg by mouth daily.    [provider]    Family History Family History  Problem Relation Age of Onset  . CAD Neg Hx     Social History Social History  Substance Use Topics  . Smoking status: Never Smoker  . Smokeless tobacco: Never Used  . Alcohol use No     Allergies   Patient has no known allergies.   Review of Systems Review of Systems  Unable to perform ROS: Mental status change     Physical Exam Updated Vital Signs Wt 63 kg (138 lb 14.2 oz)   BMI 23.11 kg/m   Physical Exam  Constitutional:  Unresponsive, chronically ill-appearing  HENT:  Head: Normocephalic and atraumatic.  King airway in place with vomitus coming up through suction port  Cardiovascular:  Weak femoral pulses  Pulmonary/Chest:  No spontaneous respiratory effort. Rhonchi with King airway ventilation bilaterally  Abdominal: Soft. She exhibits no distension.  Musculoskeletal:  Pitting edema to bilateral lower extremities, right greater than left. IO in left anterior tibia.  Neurological:  GCS 1-1-1  Skin: Skin is warm and dry. Capillary refill takes less than 2 seconds.  Nursing note and vitals reviewed.    ED Treatments / Results  Labs (all labs ordered are listed, but only abnormal results are displayed) Labs Reviewed  I-STAT CHEM 8, ED - Abnormal; Notable for the following:       Result Value   Sodium 125 (*)    Potassium 6.4 (*)    BUN 47 (*)    Creatinine, Ser 2.80 (*)    Glucose, Bld 117 (*)    Calcium, Ion 0.85 (*)    Hemoglobin 16.7 (*)    HCT 49.0 (*)    All other  components within normal limits  I-STAT TROPONIN, ED - Abnormal; Notable for the following:    Troponin i, poc 0.29 (*)    All other components within normal limits    EKG  EKG Interpretation None       Radiology No results found.  Procedures Procedures (including critical care time) CRITICAL CARE Performed by: Tilden Fossa   Total critical care time: 45 minutes  Critical care time was exclusive of separately billable procedures and treating other patients.  Critical care was necessary to treat or prevent imminent or life-threatening deterioration.  Critical care was time spent personally by me on the following activities: development of treatment plan with patient and/or surrogate as well as nursing, discussions with consultants, evaluation of patient's response to treatment, examination of patient,  obtaining history from patient or surrogate, ordering and performing treatments and interventions, ordering and review of laboratory studies, ordering and review of radiographic studies, pulse oximetry and re-evaluation of patient's condition.  Medications Ordered in ED Medications  morphine 4 MG/ML injection (not administered)  EPINEPHrine (ADRENALIN) 1 MG/10ML injection (1 Syringe Intravenous Given 04/20/2017 1812)  sodium bicarbonate injection (50 mEq Intravenous Given 04/12/2017 1759)  morphine 2 MG/ML injection (2 mg Intravenous Given 04/15/2017 1830)     Initial Impression / Assessment and Plan / ED Course  I have reviewed the triage vital signs and the nursing notes.  Pertinent labs & imaging results that were available during my care of the patient were reviewed by me and considered in my medical decision making (see chart for details).     Patient presents via EMS following an out of hospital cardiac arrest with return of circulation. She is currently on hospice but full code. Patient's with Brooke DareKing airway in place on ED arrival with no spontaneous respiratory effort. She had  recurrent arrest in the ED shortly after arrival and CPR was restarted. Please see code sheet for further details.  She did have return of spontaneous circulation a second time. Family updated of critical nature of illness and she was transitioned to comfort measures. King airway was removed and family spent time with the patient.   TOD 1844  Final Clinical Impressions(s) / ED Diagnoses   Final diagnoses:  None    New Prescriptions New Prescriptions   No medications on file     Tilden Fossaees, Madix Blowe, MD 04/09/17 [phone]

## 2017-04-07 NOTE — ED Triage Notes (Signed)
Pt here post cpr , pt was a witnessed arrest by grandson , first cpr at 1651 pt received 1 epi in pea, pt regained pulses at 1705 ,

## 2017-04-12 MED FILL — Medication: Qty: 1 | Status: AC

## 2017-05-02 DEATH — deceased

## 2018-08-08 IMAGING — US US RENAL
1 series · 14 of 25 positions shown · non-contrast
Comparison: CT abdomen pelvis 04/14/2006

CLINICAL DATA: Acute renal failure, history diabetes mellitus,
hypertension, stage III chronic kidney disease

EXAM:
RENAL / URINARY TRACT ULTRASOUND COMPLETE

[Series 1: us renal · 0.25mm/px · 14 of 28 slices shown]
[im 1/28]
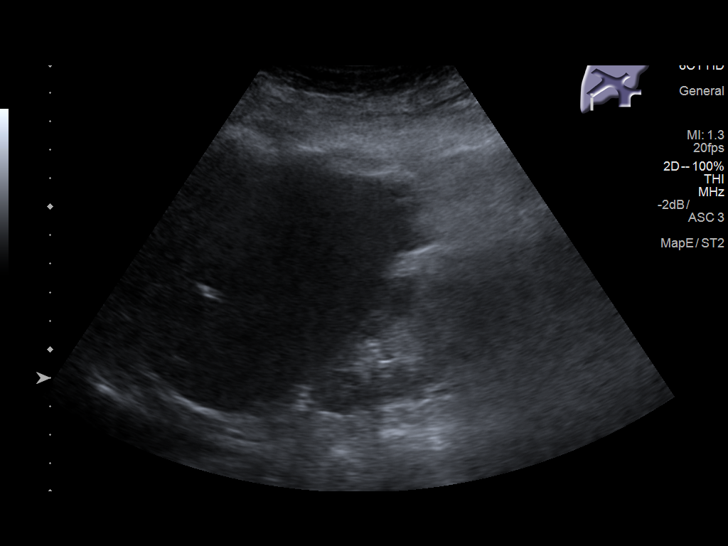
[im 3/28]
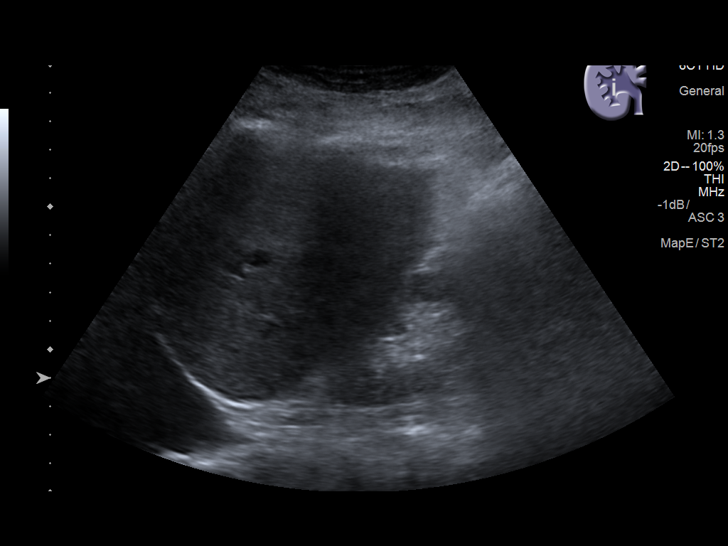
[im 5/28]
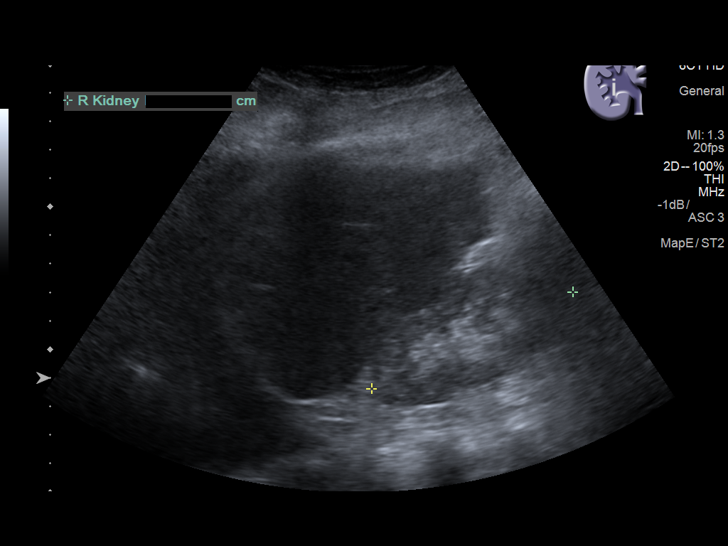
[im 7/28]
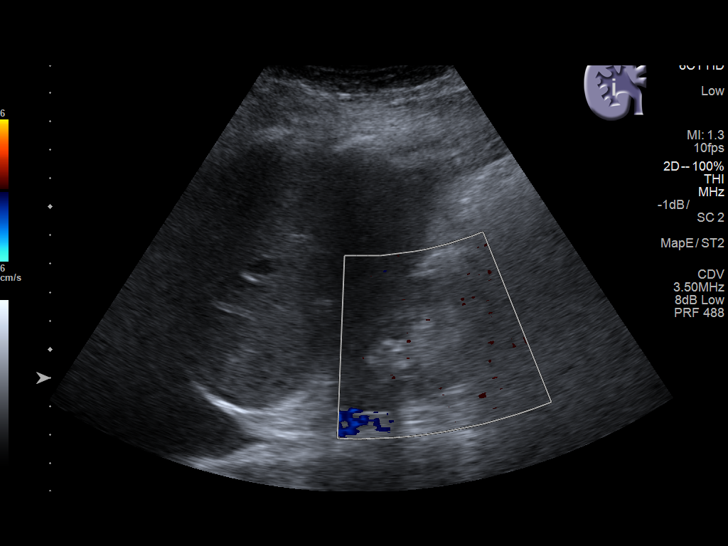
[im 10/28]
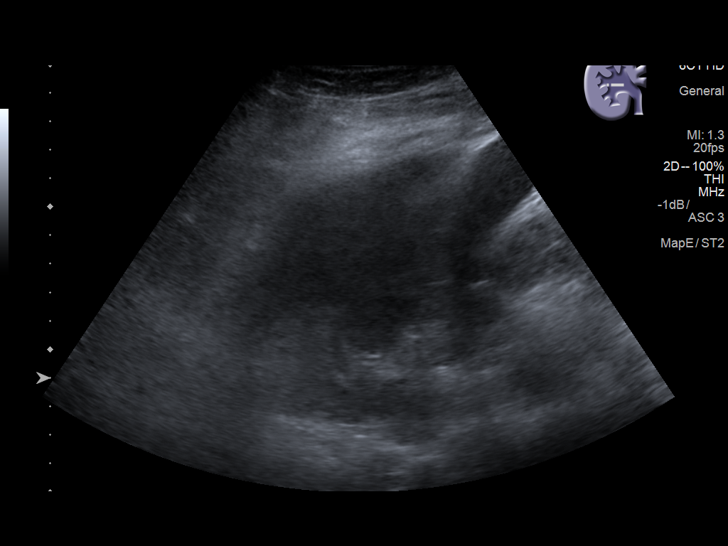
[im 11/28]
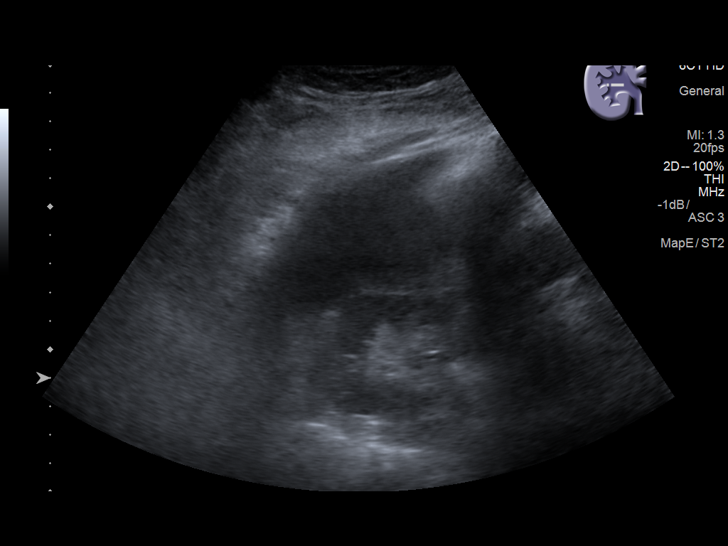
[im 13/28]
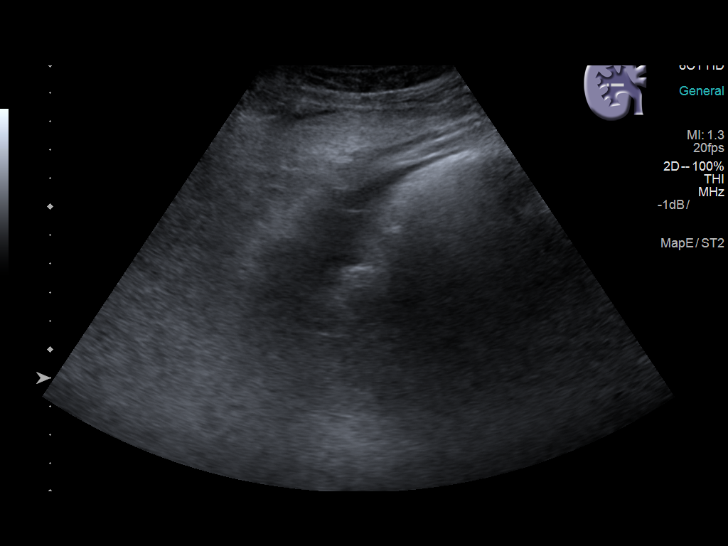
[im 15/28]
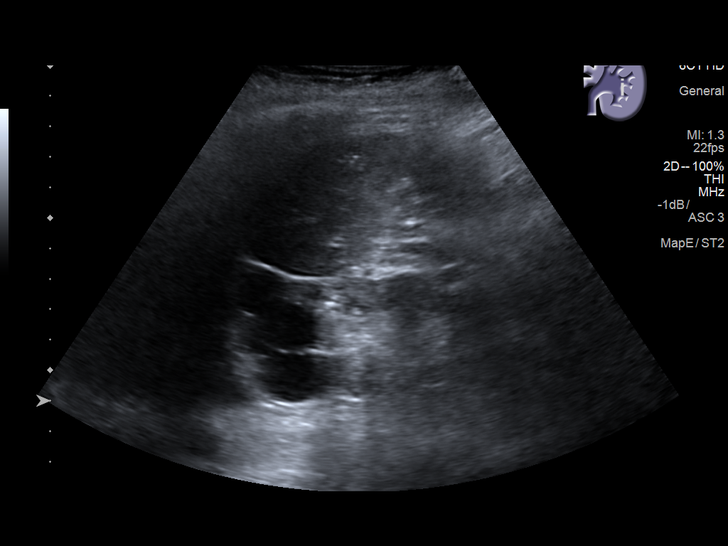
[im 17/28]
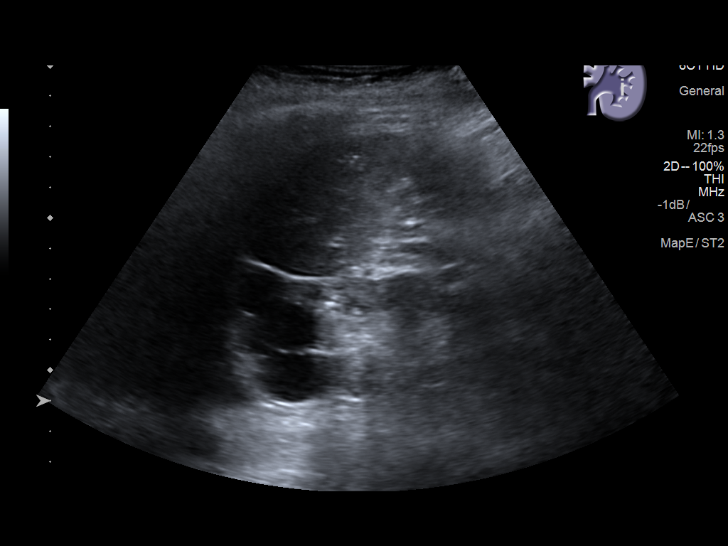
[im 19/28]
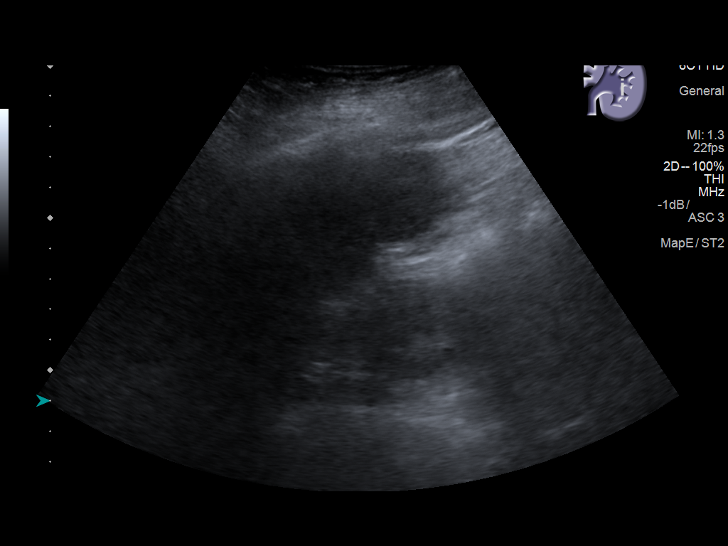
[im 21/28]
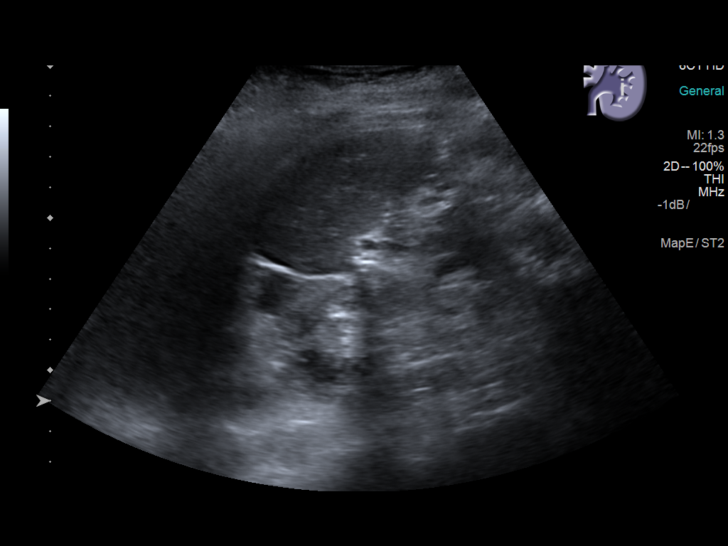
[im 23/28]
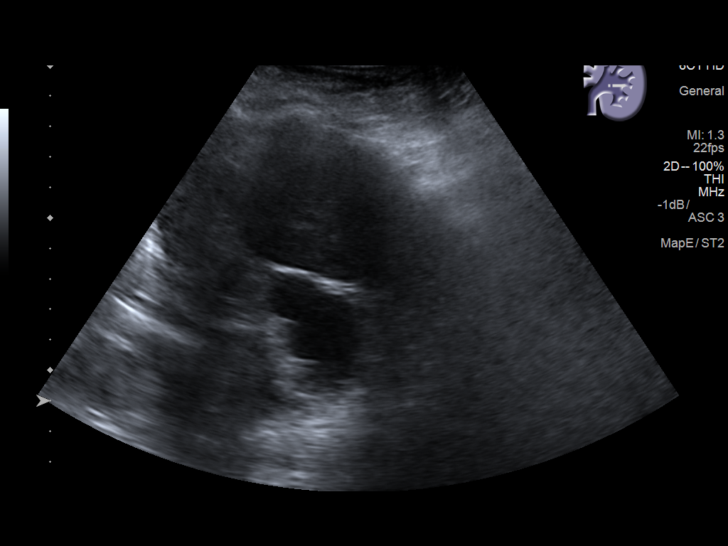
[im 25/28]
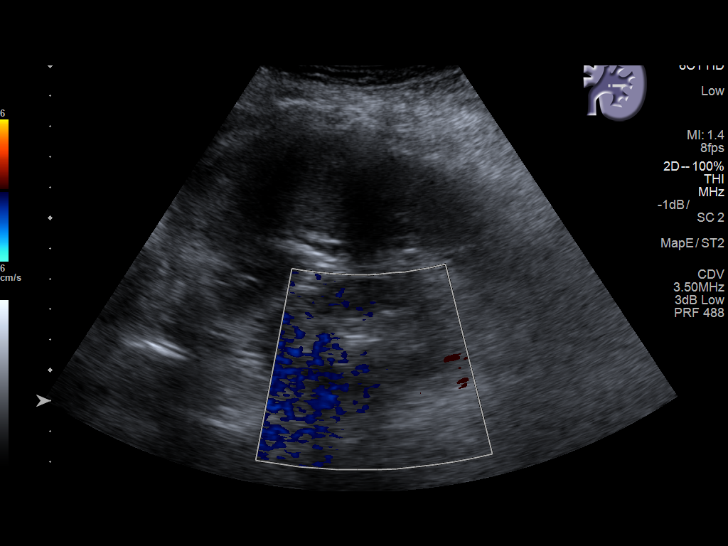
[im 28/28]
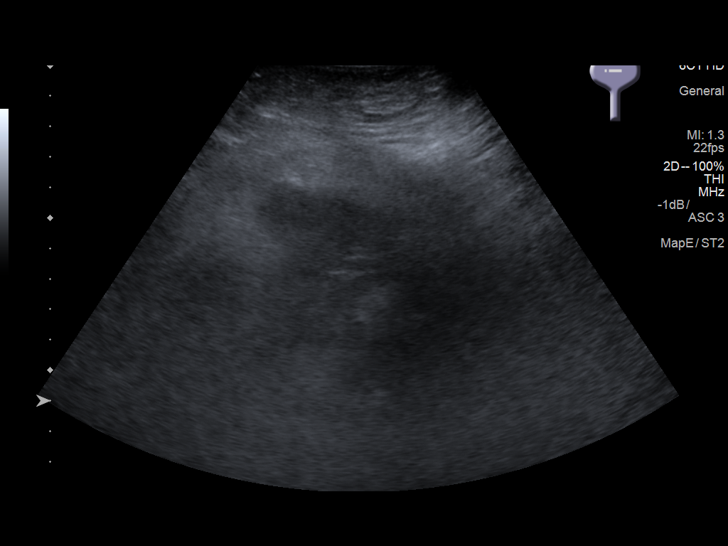

[14 of 25 positions shown; findings below may reference images not displayed]

FINDINGS: Right Kidney:

Length: 7.8 cm. Appear small and atrophic. Slightly increased
cortical echogenicity. Cortical thinning. No gross mass or
hydronephrosis.

Left Kidney:

Length: 8.5 cm. Cortical thinning with slightly increased cortical
echogenicity. Septated cyst at upper pole LEFT kidney 2.9 x 3.7 x
3.2 cm, measured 4.1 cm greatest size in 7335. No hydronephrosis or
definite solid mass.

Bladder:

Decompressed, unable to evaluate
IMPRESSION: Atrophic appearing kidneys with medical renal disease changes.

Septated cyst at upper pole LEFT kidney 3.7 cm greatest size.
